# Patient Record
Sex: Female | Born: 1950 | Race: White | Hispanic: No | Smoking: Never smoker
Health system: Southern US, Community
[De-identification: ages and names within clinical notes are randomized; demographics above are authoritative.]

## PROBLEM LIST (undated history)

## (undated) DIAGNOSIS — M1990 Unspecified osteoarthritis, unspecified site: Secondary | ICD-10-CM

## (undated) DIAGNOSIS — F419 Anxiety disorder, unspecified: Secondary | ICD-10-CM

## (undated) DIAGNOSIS — K5909 Other constipation: Secondary | ICD-10-CM

## (undated) DIAGNOSIS — Z9889 Other specified postprocedural states: Secondary | ICD-10-CM

## (undated) DIAGNOSIS — F32A Depression, unspecified: Secondary | ICD-10-CM

## (undated) DIAGNOSIS — K589 Irritable bowel syndrome without diarrhea: Secondary | ICD-10-CM

## (undated) DIAGNOSIS — G4700 Insomnia, unspecified: Secondary | ICD-10-CM

## (undated) DIAGNOSIS — E785 Hyperlipidemia, unspecified: Secondary | ICD-10-CM

## (undated) DIAGNOSIS — R112 Nausea with vomiting, unspecified: Secondary | ICD-10-CM

## (undated) DIAGNOSIS — F329 Major depressive disorder, single episode, unspecified: Secondary | ICD-10-CM

## (undated) DIAGNOSIS — T7840XA Allergy, unspecified, initial encounter: Secondary | ICD-10-CM

## (undated) HISTORY — PX: TONSILLECTOMY: SUR1361

## (undated) HISTORY — PX: COLONOSCOPY: SHX174

## (undated) HISTORY — DX: Allergy, unspecified, initial encounter: T78.40XA

## (undated) HISTORY — DX: Anxiety disorder, unspecified: F41.9

## (undated) HISTORY — DX: Insomnia, unspecified: G47.00

## (undated) HISTORY — PX: KNEE ARTHROSCOPY: SHX127

## (undated) HISTORY — DX: Unspecified osteoarthritis, unspecified site: M19.90

## (undated) HISTORY — DX: Major depressive disorder, single episode, unspecified: F32.9

## (undated) HISTORY — DX: Depression, unspecified: F32.A

## (undated) HISTORY — DX: Hyperlipidemia, unspecified: E78.5

---

## 1994-08-22 HISTORY — PX: FRACTURE SURGERY: SHX138

## 1994-08-22 HISTORY — PX: HARDWARE REMOVAL: SHX979

## 1998-07-20 ENCOUNTER — Other Ambulatory Visit: Admission: RE | Admit: 1998-07-20 | Discharge: 1998-07-20 | Payer: Self-pay | Admitting: *Deleted

## 1999-07-29 ENCOUNTER — Other Ambulatory Visit: Admission: RE | Admit: 1999-07-29 | Discharge: 1999-07-29 | Payer: Self-pay | Admitting: *Deleted

## 2000-03-13 ENCOUNTER — Encounter: Admission: RE | Admit: 2000-03-13 | Discharge: 2000-03-13 | Payer: Self-pay | Admitting: Sports Medicine

## 2000-09-28 ENCOUNTER — Encounter (INDEPENDENT_AMBULATORY_CARE_PROVIDER_SITE_OTHER): Payer: Self-pay | Admitting: Specialist

## 2000-09-28 ENCOUNTER — Ambulatory Visit (HOSPITAL_COMMUNITY): Admission: RE | Admit: 2000-09-28 | Discharge: 2000-09-28 | Payer: Self-pay | Admitting: *Deleted

## 2000-10-25 ENCOUNTER — Other Ambulatory Visit: Admission: RE | Admit: 2000-10-25 | Discharge: 2000-10-25 | Payer: Self-pay | Admitting: *Deleted

## 2001-08-22 HISTORY — PX: KNEE ARTHROSCOPY: SHX127

## 2001-09-07 ENCOUNTER — Other Ambulatory Visit: Admission: RE | Admit: 2001-09-07 | Discharge: 2001-09-07 | Payer: Self-pay | Admitting: *Deleted

## 2001-12-05 ENCOUNTER — Ambulatory Visit (HOSPITAL_COMMUNITY): Admission: RE | Admit: 2001-12-05 | Discharge: 2001-12-05 | Payer: Self-pay | Admitting: Gastroenterology

## 2002-07-01 ENCOUNTER — Ambulatory Visit (HOSPITAL_BASED_OUTPATIENT_CLINIC_OR_DEPARTMENT_OTHER): Admission: RE | Admit: 2002-07-01 | Discharge: 2002-07-01 | Payer: Self-pay | Admitting: Orthopedic Surgery

## 2002-09-10 ENCOUNTER — Other Ambulatory Visit: Admission: RE | Admit: 2002-09-10 | Discharge: 2002-09-10 | Payer: Self-pay | Admitting: Obstetrics & Gynecology

## 2003-09-16 ENCOUNTER — Other Ambulatory Visit: Admission: RE | Admit: 2003-09-16 | Discharge: 2003-09-16 | Payer: Self-pay | Admitting: Obstetrics & Gynecology

## 2004-08-22 HISTORY — PX: DILATION AND CURETTAGE OF UTERUS: SHX78

## 2004-09-17 ENCOUNTER — Ambulatory Visit (HOSPITAL_COMMUNITY): Admission: RE | Admit: 2004-09-17 | Discharge: 2004-09-17 | Payer: Self-pay | Admitting: Obstetrics and Gynecology

## 2004-09-17 ENCOUNTER — Encounter (INDEPENDENT_AMBULATORY_CARE_PROVIDER_SITE_OTHER): Payer: Self-pay | Admitting: Specialist

## 2006-12-25 ENCOUNTER — Ambulatory Visit: Payer: Self-pay | Admitting: Psychology

## 2007-07-04 ENCOUNTER — Ambulatory Visit (HOSPITAL_COMMUNITY): Admission: RE | Admit: 2007-07-04 | Discharge: 2007-07-04 | Payer: Self-pay | Admitting: Obstetrics

## 2007-07-04 ENCOUNTER — Encounter (INDEPENDENT_AMBULATORY_CARE_PROVIDER_SITE_OTHER): Payer: Self-pay | Admitting: Obstetrics

## 2009-08-27 ENCOUNTER — Ambulatory Visit: Payer: Self-pay | Admitting: Sports Medicine

## 2009-08-27 DIAGNOSIS — M545 Low back pain, unspecified: Secondary | ICD-10-CM | POA: Insufficient documentation

## 2009-08-27 DIAGNOSIS — M1712 Unilateral primary osteoarthritis, left knee: Secondary | ICD-10-CM | POA: Insufficient documentation

## 2009-08-27 DIAGNOSIS — M76899 Other specified enthesopathies of unspecified lower limb, excluding foot: Secondary | ICD-10-CM | POA: Insufficient documentation

## 2009-08-27 DIAGNOSIS — M79609 Pain in unspecified limb: Secondary | ICD-10-CM | POA: Insufficient documentation

## 2010-05-25 ENCOUNTER — Encounter: Payer: Self-pay | Admitting: Sports Medicine

## 2010-09-23 NOTE — Consult Note (Signed)
Summary: [facility]  [facility]   Imported By: Marily Memos 05/26/2010 11:31:21  _____________________________________________________________________  External Attachment:    Type:   Image     Comment:   External Document

## 2010-09-23 NOTE — Consult Note (Signed)
Summary: [facility]  [facility]   Imported By: Marily Memos 05/26/2010 13:37:19  _____________________________________________________________________  External Attachment:    Type:   Image     Comment:   External Document

## 2010-09-23 NOTE — Assessment & Plan Note (Signed)
Summary: Erika Johns/POSSIBLE ARTHRITIS/HIP /MJD   Vital Signs:  Patient profile:   60 year old female Height:      70 inches Weight:      155 pounds BMI:     22.32 BP sitting:   112 / 77  Vitals Entered By: Lillia Pauls CMA (August 27, 2009 3:43 PM)  History of Present Illness: Pt presents for a variety of concerns today including bilateral hip pain, numbness in her left 1st through 3rd fingers and right ankle pain. She has recently lost her father and was diagnosed with depression and insomnia. She was started on Remeron which has been helpful for her sleep. However, she can have very severe back pain with just leaning forward. On the other hand, though, she can cycle without any difficulties. She has now had the intermittent back pain for years. It is typically worse in the morning when she gets up. After she does stretches and takes a hot shower, her pain improves. When she does have the pain, she feels it in her lower back and it radiates down to her bilateral hips. She is no longer able to sleep flat on her back but can sleep on her hips. Her hip pain is lateral with minimal radiation down her ITB. She has taken OTC meds including NSAIDs but these are only mildly helpful. She has a history of scoliosis.   Her left 1st through 3rd finger numbness usually takes place only when she gets to sleep longer in the weekends. She denies weakness or constant symptoms. She has no prior history of carpal tunnel syndrome.    The patient also has a history of chronic left knee pain that she has had injected multiple times in the past. She usually gets relief for about 6 months. She is requesting another injection today if possible.   Allergies (verified): 1)  ! Ampicillin  Physical Exam  General:  alert and well-developed.   Head:  normocephalic and atraumatic.   Msk:  Back: Scoliosis noted of her thoracic spine with her left shoulder blade being higher than her right shoulder blade + TTP along L4-L5  but is maily paraspinal muscle pain Excellent forward flexion, mild-mod pain with back extension but limited radiation of pain, no pain with side to side movements or rotation except for tightness in her lower back Able to walk on heels and toes Normal sensation of bilateral lower extremities 5/5 strength with resisted knee flexion and extension Neg SLR bilaterally 5/5 strength with resisted hip flexion and extension + TTP of bilateral trochanteric bursas  Left Hand No atrophy Full ROM of wrist Neg tinnel's testing 5/5 grip strength No TTP throughout  Left Knee: No bony abnormalities, edema or bruising. Full flexion and extension of the knee Minimal TTP along the lateral joint line Normal MCL and LCL Mild pain with flexion pinch and McMurray's testing Negative anterior and posterior drawer signs Able to walk without a limp 5/5 strength with resisted knee flexion and extension  Right Knee: No bony abnormalities, edema or bruising. Full flexion and extension of the knee No TTP along the joint line Normal MCL and LCL No pain with flexion pinch and McMurray's testing Negative anterior and posterior drawer signs Able to walk without a limp 5/5 strength with resisted knee flexion and    Impression & Recommendations:  Problem # 1:  LUMBAGO (ICD-724.2) Assessment New Patient is having acute flares of a chronic disorder. Remeran has been helpful for the patient's sleep but she has  been gaining weight. 1. Will taper the patient off of Remeron since her grief is now better controlled and titrate up amitriptyline 25mg . The paitent is to call immediately if she has any difficulty with urination as she has had problems with urinary retention. 2. Patient is to continue daily exercises and warm showers. 3. Modify activities as needed. 4. Follow-up in 4 weeks to assess new amitriptyline.  Problem # 2:  TROCHANTERIC BURSITIS (ICD-726.5) Assessment: New This pain is bilateral but not  severe. 1. Amitriptyline should help with pain symptoms. 2. Continue home back exercises and make sure that she continues to keep a normal walking gait.  Problem # 3:  HAND PAIN, LEFT (ICD-729.5) Assessment: New May be due to position while sleep since this in only a very itermittent finding., 1. Will monitor and consider a night splint if symptoms progress.  Problem # 4:  KNEE PAIN (ICD-719.46)  The patient had her left knee injected during the office visit for chronic knee pain relief. She tolerated the procedure well without any difficulty.  Consent obtained and verified. Sterile betadine prep. Furthur cleansed with alcohol. Topical analgesic spray: Ethyl chloride. Joint: left knee Approached in typical fashion with: distal medial Completed without difficulty Meds: 40mg  of kenalog and 6 cc of 1% lidocaine Needle: 25 guage Aftercare instructions and Red flags advised.  Orders: Joint Aspirate / Injection, Large (20610) Kenalog 10mg  (4units) (J3301)  Complete Medication List: 1)  Amitriptyline Hcl 25 Mg Tabs (Amitriptyline hcl) .... One tab by mouth qhs  Patient Instructions: 1)  Take half a tablet of the Remeron at night for the next week, then take it every other day for the following week. 2)  Start the amitriptyline at bedtime the week that you've decreased the Remeron every other day. 3)  If you have urinary symptoms please let us know and stop taking the amitriptyline.  Prescriptions: AMITRIPTYLINE HCL 25 MG TABS (AMITRIPTYLINE HCL) One tab by mouth QHS  #30 x 1   Entered and Authorized by:   Jannifer Rodney MD   Signed by:   Jannifer Rodney MD on 08/27/2009   Method used:   Electronically to        [facility]  [address] [phone]* (retail)       [address]       [place], [place]  [address]       Ph: [phone] or [phone]       Fax: [phone]   RxID:   [id number]

## 2011-01-04 NOTE — Op Note (Signed)
NAMEMARTESHA, Erika Johns     ACCOUNT NO.:  [id number]   MEDICAL RECORD NO.:  [id number]          PATIENT TYPE:  AMB   LOCATION:  [facility]C                           FACILITY:  [facility]   PHYSICIAN:  Lendon Colonel, MD   DATE OF BIRTH:  04-13-1951   DATE OF PROCEDURE:  07/04/2007  DATE OF DISCHARGE:                               OPERATIVE REPORT   PREOPERATIVE DIAGNOSIS:  Pelvic mass, free pelvic fluid.   POSTOPERATIVE DIAGNOSIS:  Free pelvic fluid, pedunculated fibroids.   OPERATION PERFORMED:  Diagnostic laparoscopy, aspiration of free pelvic  fluid.   SURGEON:  Lendon Colonel, MD   ASSISTANT:  Eliberto Ivory. Rosalio Macadamia, M.D.   ANESTHESIA:  General.   FINDINGS:  Small uterus, stenosed cervix, small atrophic ovaries  bilaterally.  Moderate amount of yellow free pelvic fluid.  Three  pedunculated fibroids, largest at the left cornua measuring  approximately 3 x 4 cm with a thick stalk.  The other two pedunculated  fibroids are approximately 1 cm with thick stalks as well.   SPECIMENS:  Free pelvic fluid to pathology.   ESTIMATED BLOOD LOSS:  10 mL.   COMPLICATIONS:  None.   DESCRIPTION OF PROCEDURE:  After informed consent was obtained, the  patient was taken to the operating room where general anesthesia was  initiated successfully.  She was prepped and draped in the normal  sterile fashion in dorsal supine lithotomy position.  A Foley catheter  was inserted sterilely.  A Bimanual examination was done to assess the  size and position of the uterus.  No adnexal masses were palpated.  A  hand was inserted into the vagina.  Single toothed tenaculum was used to  grasp the anterior lip of the cervix.  The cervix was unable to be  dilated secondary to cervical stenosis.  A second tenaculum was placed  on the posterior lip of the cervix and this was used for uterine  manipualtion during the case.  Gloves were changed and attention was  taken to the patient's abdomen.  10 mL of 0.25%  Marcaine were infused in  the infraumbilical fold.  A scalpel was used to make a skin incision.  A  Veress needle was inserted without difficulty.  Intraperitoneal  placement was confirmed with the use of a gas-filled syringe.  A  pneumoperitoneum was created to 15 mmHg.  A nonbladed 10 mm trocar was  inserted into the patient's abdomen.  Intraperitoneal placement was  confirmed with the laparoscope and pneumoperitoneum was maintained.  Patient was placed in steep Trendelenburg position and the survey of the  patient's abdomen and pelvis revealed findings as above. Normal liver  edge was noted, moderate amount of free pelvic fluid, normal atrophic  bilaterally ovaries were noted and three normal-appearing pedunculated  fibroids were noted.  To obtain better visualization, a 5 mm left lower  quadrant port was inserted after first infusing with 0.25% Marcaine in  the skin incision.  A nonbladed trocar was used through this port a  blunt probe and later a blunt grasping instrument was used for better  exposure of the patient's abdomen and pelvis.  Given the apparent normal  findings and the patient's desire for diagnostic laparoscopy and  operative laparoscopy only if necessary, decision was made to terminate  the procedure.  The pelvic fluid was aspirated through a needle  aspirator inserted through the lateral port.  The pneumoperitoneum was  released.  All instruments were removed under visualization and the skin  incisions were closed with 4-0 Monocryl and Dermabond.  Foley catheter  was removed.  Tenaculums were removed.  Repeat speculum exam revealed  hemostasis at the tenaculum sites.  The patient tolerated the procedure  and was taken to recovery room in stable condition after sponge, lap,  and needle counts were correct x2.      Lendon Colonel, MD  Electronically Signed     KAF/MEDQ  D:  07/04/2007  T:  07/04/2007  Job:  [id number]

## 2011-01-07 NOTE — H&P (Signed)
[facility]  Patient:    Erika Johns, Erika Johns              MRN: [id number] Adm. Date:  09/28/00 Attending:  Sung Amabile. Roslyn Smiling, M.D.                         History and Physical  DATE OF BIRTH:                1951-02-10  CHIEF COMPLAINT:              Endometrial mass and postmenopausal bleeding.  HISTORY OF PRESENT ILLNESS:   A 60 year old woman G0 receiving Premarin 0.625 mg and Prometrium 200 mg on days 1-14 of every month who is admitted for operative hysteroscopy after ultrasound revealed a thickened endometrium measuring 1.15 cm and a hyperechoic mass measuring 0.74 x 0.7 cm.  This study was performed because of bleeding at a time that was not appropriate for withdrawal.  The patient has been counseled regarding the benefits, risks, options, and expected outcome of this procedure prior to the hysteroscopy. She is admitted now to rule out endometrial pathology.  PAST MEDICAL HISTORY:         Urinary tract infections.  PAST SURGICAL HISTORY:        Reduction of right fibula fracture in 1996.  ALLERGIES:                    AMPICILLIN and SUDAFED.  MEDICATIONS:                  Premarin, Prometrium, Nasonex, Zyrtec, calcium, vitamins.  FAMILY HISTORY:               Mother with history of breast and colon cancer, sister with adult-onset diabetes mellitus.  SOCIAL HISTORY:               Married.  Works at [employer].  Denies tobacco use.  Drinks one glass of wine weekly.  PHYSICAL EXAMINATION:  GENERAL:                      Healthy appearing woman.  VITAL SIGNS:                  Afebrile.  Vital signs stable.  HEENT:                        Within normal limits.  NECK:                         Without thyromegaly.  CHEST:                        Clear.  COR:                          Regular rate and rhythm.  S1, S2 normal.  BREASTS:                      Without mass, tenderness, axillary or supraclavicular  nodes.  ABDOMEN:                      Soft, nontender without organomegaly, mass, or hernia.  BACK:  Without CVAT.  GENITOURINARY:                External genitalia, BUS, vagina without lesions. Cervix without lesions.  Uterus:  Retroverted, normal size, nontender, mobile. Adnexa:  Not palpable.  Rectovaginal examination confirmatory.  EXTREMITIES:                  Without CCE.  SKIN:                         Without lesions.  NEUROLOGIC:                   Grossly intact.  ASSESSMENT:                   1. Endometrial mass.                               2. Postmenopausal bleeding at a time that is not                                  expected in patient receiving cyclical                                  hormonal replacement therapy.  PLAN:                         [facility] DD:  09/27/00 TD:  09/27/00 Job: [id number]

## 2011-01-07 NOTE — Op Note (Signed)
NAME:  Erika Johns, Erika Johns               ACCOUNT NO.:  [id number]   MEDICAL RECORD NO.:  [id number]                   PATIENT TYPE:  AMB   LOCATION:  DSC                                  FACILITY:  [facility]   PHYSICIAN:  Robert A. Thurston Hole, M.D.              DATE OF BIRTH:  August 21, 1951   DATE OF PROCEDURE:  07/01/2002  DATE OF DISCHARGE:                                 OPERATIVE REPORT   PREOPERATIVE DIAGNOSIS:  Left knee patellofemoral chondromalacia and  synovitis.   POSTOPERATIVE DIAGNOSIS:  Left knee patellofemoral chondromalacia and  synovitis with loose bodies.   PROCEDURES:  1. Left knee examination under anesthesia followed by arthroscopic excision     of loose bodies.  2. Left knee chondroplasty.  3. Left knee lateral retinacular release.   SURGEON:  Elana Alm. Thurston Hole, M.D.   ASSISTANT:  Julien Girt, P.A.   ANESTHESIA:  General.   OPERATIVE TIME:  30 minutes.   COMPLICATIONS:  None.   INDICATIONS FOR PROCEDURE:  The patient is a 60 year old woman who has had  six months of increasing left knee with signs and symptoms and MRI  documenting significant patellofemoral chondromalacia and synovitis, who has  failed conservative care and is now to undergo arthroscopy with lateral  release.   DESCRIPTION OF PROCEDURE:  The patient was brought to the operating room on  July 01, 2002, and placed on the operating table in the supine position.  After and adequate level of general anesthesia was obtained, her left knee  was examined under anesthesia.  She had full range of motion and her knee  was stable ligamentous exam with normal patellar tracking.  The left knee  was sterilely injected with 0.25% Marcaine with epinephrine.  The left leg  was then prepped using sterile DuraPrep and draped using sterile technique.  Originally through an inferolateral portal the arthroscope with a pump  attachment was placed and through an inferomedial portal an arthroscope  was  placed.  On initial inspection of the medial compartment, she was found to  have grade 3 chondromalacia over 30% of the medial femoral condyle, which  was debrided.  There were loose articular cartilage pieces in the medial  compartment and these were removed.  The medial tibial plateau showed mild  grade 1-2 chondromalacia.  The medial meniscus was normal.  ACL and PCL were  normal.  Lateral compartment articular cartilage normal.  Lateral meniscus  normal.  The patellofemoral joint showed grade 3 chondromalacia over 75% of  the patella, which was thoroughly debrided, and 20% of the femoral groove,  which was debrided.  The patella tracked normally, but there was significant  patella compression syndrome and significant synovitis in the medial and  lateral gutters, which was debrided.  A lateral retinacular release was  carried out with an ArthroCare wand.  No excessive bleeding was encountered  and this significantly decompressed the patellofemoral joint and patellar  tracking was normal after this was done.  At this point, it was felt that  all pathology had been satisfactorily addressed.  The instruments were  removed.  The portals were closed with 3-0 nylon suture and injected with  0.25% Marcaine with epinephrine and 4 mg of morphine.  A sterile dressing  was applied.  The patient was awaken and taken to the recovery room in  stable condition.   FOLLOW-UP CARE:  The patient will be followed as an outpatient on Darvocet  and Celebrex.  Will see her back in the office in a week for sutures out and  follow-up.                                               Robert A. Thurston Hole, M.D.    RAW/MEDQ  D:  07/01/2002  T:  07/01/2002  Job:  [id number]

## 2011-01-07 NOTE — Procedures (Signed)
[facility]  Patient:    JISELE, PRICE Visit Number: [id number] MRN: [id number]          Service Type: END Location: ENDO Attending Physician:  Dennison Bulla Ii Dictated by:   Verlin Grills, M.D. Proc. Date: 12/05/01 Admit Date:  12/05/2001   CC:         Sung Amabile. Roslyn Smiling, M.D.   Procedure Report  PROCEDURE:  Screening colonoscopy.  REFERRING PHYSICIAN:  Sung Amabile. Roslyn Smiling, M.D.  PROCEDURE INDICATION:  Ms. Hadas Jessop is a 60 year old female born 01/25/1951.  Her mother was diagnosed with colon cancer at age 65. Ms. Haren screening colonoscopy, March 1998, was normal.  ENDOSCOPIST:  Verlin Grills, M.D.  PREMEDICATION:  Versed 10 mg, Demerol 50 mg.  ENDOSCOPE:  Olympus pediatric colonoscope.  DESCRIPTION OF PROCEDURE:  After obtaining informed consent, Ms. Sparks-Baumgartner was placed in the left lateral decubitus position.  I administered intravenous Demerol and intravenous Versed to achieve conscious sedation for the procedure.  The patients blood pressure, oxygen saturation, and cardiac rhythm were monitored throughout the procedure and documented in the medical record.  Anal inspection was normal.  Digital rectal exam was normal.  The Olympus pediatric video colonoscope was introduced into the rectum and easily advanced to the cecum.  Colonic preparation for the exam today was excellent.  RECTUM:  Normal.  SIGMOID COLON AND DESCENDING COLON:  Normal.  SPLENIC FLEXURE:  Normal.  TRANSVERSE COLON:  Normal.  HEPATIC FLEXURE:  Normal.  ASCENDING COLON:  Normal.  CECUM AND ILEOCECAL VALVE:  Normal.  ASSESSMENT:  Normal screening proctocolonoscopy to the cecum.  No endoscopic evidence for the presence of colorectal neoplasia.  RECOMMENDATIONS:  Repeat colonoscopy in approximately five years. Dictated by:   Verlin Grills, M.D. Attending Physician:  Dennison Bulla Ii DD:  12/05/01 TD:  12/05/01 Job: [id number]

## 2011-01-07 NOTE — H&P (Signed)
NAME:  Erika, Johns NO.:  [id number]   MEDICAL RECORD NO.:  [id number]          PATIENT TYPE:  AMB   LOCATION:  SDC                           FACILITY:  [facility]   PHYSICIAN:  Richardean Sale, M.D.   DATE OF BIRTH:  Mar 25, 1951   DATE OF ADMISSION:  09/17/2004  DATE OF DISCHARGE:                                HISTORY & PHYSICAL   PREOPERATIVE DIAGNOSIS:  Abnormal uterine bleeding and endometrial mass on  sonohistogram.   HISTORY OF PRESENT ILLNESS:  This is a 60 year old white female, gravida 0,  who has been on combined hormone replacement therapy for approximately four  years.  The patient has been on Menest 0.625 mg q.d. with Prometrium 200 mg  p.o. q.d. on days 1 through 12 of each month.  She has been having regular  predictable withdrawal bleeds, but over the past four to five months, she  has had breakthrough bleeding.  The patient underwent ultrasound evaluation  which revealed a normal-sized uterus, normal adnexa, and a thickened  endometrial stripe at 1.1 cm with a questionable hyperechoic mass at the  fundus.  The patient subsequently underwent sonohistogram which confirmed  the presence of an endometrial hyperechoic mass measuring 2.4 by 1.51 cm,  which is a suggestion of a submucosal fibroid.   The patient has a history of submucosal fibroid removed in February of 2002  by hysteroscopy D&C.  The patient denies any abdominal pain.  No changes in  her bowel or bladder habits.  No nausea or vomiting.  No constitutional  symptoms.  She presents today for hysteroscopy and proceed with resection of  endometrial mass.   PAST MEDICAL HISTORY:  Urinary tract infections.   PAST SURGICAL HISTORY:  Hysterectomy and D&C in 2002.  Reduction of right  fibula fracture in 1996.   GYNECOLOGIC HISTORY:  No abnormal Pap smears or STD's.   OBSTETRICAL HISTORY:  Gravida 0.   FAMILY HISTORY:  Mother with breast and colon cancer.  Sister with adult  onset diabetes.   SOCIAL HISTORY:  She is married.  Denies tobacco, alcohol or drug use.   MEDICATIONS:  1.  Menest 0.625 mg p.o. q.d.  2.  Prometrium 200 mg p.o. q.d. on days 1 through 12 of each month.  3.  Cardura 1 p.o. q.d.  4.  Premarin and vaginal cream p.r.n.  5.  Ambien 5 to 10 mg p.o. q.h.s. p.r.n. insomnia.  6.  Macrobid 100 mg p.o. q.d. after intercourse.   ALLERGIES:  AMPICILLIN and SUDAFED.   PHYSICAL EXAMINATION:  VITAL SIGNS:  She is afebrile.  Vital signs are  stable.  GENERAL:  She is a well-developed, well-nourished white female who is in no  apparent distress.  NECK:  Neck and thyroid within normal limits with no masses.  HEART:  Regular rate and rhythm.  LUNGS:  Clear to auscultation bilaterally.  ABDOMEN:  Soft, nontender, and nondistended with no palpable masses.  Liver  and spleen are normal.  EXTREMITIES:  No clubbing, cyanosis, or edema.  GENITALIA:  External genitalia within normal limits.  Vagina without  lesions.  Mild vaginal atrophy is noted.  Uterus is retroflexed, normal  sized, nontender, mobile.  Adnexa is not palpable.  Rectovaginal exam  confirms the above.   ASSESSMENT:  A 60 year old, gravida 0, white female with endometrial mass on  sonohistogram suggestive of submucosal fibroid or possible endometrial  polyp.   PLAN:  We will proceed with hysteroscopy, D&C with resection of endometrial  polyp and/or fibroid.  The risks, benefits, and alternatives of the  procedure have been reviewed with the patient in detail.  We discussed the  risks of hemorrhage requiring transfusion, infection, injury to the uterus  which could cause injury to the bowel or bladder or other intra-abdominal  organs which could require additional surgery for repair, possibility of  DVT, anesthetic related complications, and even death.  The patient voices  understanding of these risks and agrees to proceed.  All questions have been  answered and informed consent obtained.       JW/MEDQ  D:  09/16/2004  T:  09/16/2004  Job:  [id number]

## 2011-01-07 NOTE — Op Note (Signed)
[name], SWING     ACCOUNT NO.:  [id number]   MEDICAL RECORD NO.:  [id number]          PATIENT TYPE:  AMB   LOCATION:  SDC                           FACILITY:  [facility]   PHYSICIAN:  Richardean Sale, M.D.   DATE OF BIRTH:  [birth year]   DATE OF PROCEDURE:  09/17/2004  DATE OF DISCHARGE:                                 OPERATIVE REPORT   PREOPERATIVE DIAGNOSIS:  Endometrial mass.   POSTOPERATIVE DIAGNOSIS:  Endometrial polyp.   SPECIMENS:  Endometrial polyps, endometrial curettings sent to pathology.   SURGEON:  Richardean Sale, M.D.   ANESTHESIA:  General and paracervical block.   COMPLICATIONS:  None.   ESTIMATED BLOOD LOSS:  Minimal.   FLUID DEFICIT:  30 mL.   INDICATIONS:  This is a 60 year old gravida 0 white female who has a four to  five-month history of intermenstrual bleeding and worsening menses and  dysmenorrhea.  The patient underwent ultrasound evaluation, which showed a  thickened endometrial stripe.  Sonohysterogram was performed, which revealed  an endometrial mass originating from the fundus.  The patient presents today  for hysteroscopy D&C and removal of endometrial mass.  Prior to the  procedure, the risks, benefits, and alternatives were reviewed with the  patient in detail and informed consent obtained.  We discussed the risks of  hemorrhage requiring transfusion, infection, possible injury to the uterine  cavity which could require additional surgery to the abdomen to evaluate for  any injury to the bowel or bladder, the possibility of anesthetic-related  complications, DVT, in depth.  The patient voiced an understanding of all  these risks and agreed to proceed, informed consent obtained and all  questions answered before proceeding into the OR.   PROCEDURE:  The patient was taken to the operating room, where she was given  a general anesthetic.  She was then prepped and draped in the usual sterile  fashion in the dorsal lithotomy position  with a Betadine prep.  A red rubber  catheter was then placed to drain the bladder.  Once the bladder was  drained, bimanual exam was performed which confirmed the presence of a small  retroverted uterus which is mobile in the midline with no obvious masses.  Adnexa were not palpable.  A speculum was placed in the vagina and the  cervix was easily visualized and 2 mL of 1% Nesacaine were injected at the  anterior lip of the cervix.  The anterior lip of the cervix was then grasped  with a single-tooth tenaculum and a paracervical block was then administered  using a total of 20 mL of 1% Nesacaine.  The cervix was then very gently  dilated with the Hegar dilators until the hysteroscope could be introduced.  Upon initial entry of the hysteroscope, there appeared to be a mass  originating from the fundus that was visible at the internal cervical os.  Given its presence, I was unable to push the scope any further into the  endometrial cavity safely; therefore, the hysteroscope was removed and the  polyp forceps were then used to grasp the polyp.  This was removed quite  easily.  The polyp was then sent  to pathology labeled as endometrial  polyp.  The hysteroscope was then reintroduced and the uterine cavity was  now able to be more closely inspected.  There were multiple polyps coming  from the fundus; therefore, the hysteroscope was removed and the polyp  forceps were used to remove these polyps.  These were all sent to pathology  labeled as endometrial polyps.  Once the polyps had been removed, this was  followed by a sharp curettage until a gritty texture was noted in all four  quadrants.  The curettings were then sent to pathology.  Once the curettage  was complete, the hysteroscope was reintroduced and uterine cavity was  visualized and appeared normal.  There were no further polyps present.  The  endocervical canal appeared normal as well.  At this point the procedure was  terminated, the  hysteroscope was removed, the single-tooth tenaculum was  removed, and there was minimal bleeding coming from the cervical os.  All  instruments were then removed.  The patient was taken out of the dorsal  lithotomy position and was awakened from anesthesia and transferred to the  recovery room in good condition.  There were no complications.  All sponge,  lap, needle and instrument counts were correct x2.      JW/MEDQ  D:  09/17/2004  T:  09/17/2004  Job:  [id number]

## 2011-01-07 NOTE — Op Note (Signed)
[facility]  Patient:    Erika Johns, Erika Johns            MRN: [id number] Proc. Date: 09/28/00 Adm. Date:  [id number] Attending:  Ardeen Fillers CC:         Sung Amabile. Roslyn Smiling, M.D.   Operative Report  PREOPERATIVE DIAGNOSES:       1. Postmenopausal bleeding.                               2. Endometrial mass noted on ultrasound.  POSTOPERATIVE DIAGNOSES:      1. Postmenopausal bleeding.                               2. Endometrial mass noted on ultrasound.  OPERATION/PROCEDURE:          1. Operative hysteroscopy.                               2. Dilatation and curettage.                               3. Repair of cervical laceration.  SURGEON:                      Sung Amabile. Roslyn Smiling, M.D.  ANESTHESIA:                   General anesthesia via LMA, paracervical block.  ESTIMATED BLOOD LOSS:         Estimated blood loss was 50 cc.  TUBES/DRAINS:                 None.  COMPLICATIONS:                Superficial anterior cervical laceration, repaired.  FINDINGS:                     The uterus was retroflexed and sounded to 10 cm. Cervical stenosis was noted.  A mass was noted in the endometrial cavity and was resected.  SPECIMENS:                    Endometrial biopsies and endometrial curettings sent to pathology.  INDICATIONS FOR PROCEDURE:    The patient is a 60 year old postmenopausal woman receiving cyclical hormone replacement therapy, who has had bleeding at unexpected intervals.  Ultrasound reveals a thickened endometrium and suggestion of an endometrial mass, and the patient is now admitted for operative hysteroscopy and resection of this mass.  DESCRIPTION OF PROCEDURE:     After establishment of general anesthesia the patient was placed on the operating room table in the dorsal lithotomy position and the perineum and vagina were prepped with Betadine solution, the bladder was evacuated, and the patient draped.              Examination under anesthesia was carried out.  A Graves speculum inserted in the vagina.  The cervix was reprepped with Betadine solution and the anterior and posterior cervical lip infiltrated with xylocaine 1%.  A paracervical block was placed in the usual fashion using 20 cc of xylocaine 1%.  The anterior cervix was grasped with a single-tooth tenaculum and the uterus was retroflexed and sounded to 10 cm.  Pratt dilators were used to dilate the cervix.  In the process of dilation the tenaculum pulled through the anterior cervical lip and this was repaired at that time with two figure-of-eight sutures of 0 chromic.  Single-tooth tenaculums were placed on the anterior and posterior lip of the cervix subsequently.                                The cervix was dilated to a #35 [place].  The hysteroscope was passed into the endometrial cavity and photographs were taken.  A mass on the posterior wall was noted and this was resected using a 90 degree double-loop hysteroscopic resectoscopic loop with settings of 190 and 90 cutting and coagulation respectively.  Sorbitol was the distending medium.  Pressures ranged between 60-80 mmHg during the case.  The scope was removed and sharp curettage performed.  The resectoscopic biopsies were sent separately from the endometrial curettings.  Instruments were removed and hemostasis was noted.  The Sorbitol deficit at the end of the case was considered to be 200 cc.  The patient was returned to the supine position, extubated without difficulty, and transported to the recovery room in satisfactory condition. DD:  09/28/00 TD:  09/29/00 Job: [id number]

## 2011-02-01 ENCOUNTER — Other Ambulatory Visit: Payer: Self-pay | Admitting: Obstetrics

## 2011-05-31 LAB — CBC
HCT: 38.2
MCV: 92.5
Platelets: 144 — ABNORMAL LOW
WBC: 4.1

## 2011-07-27 ENCOUNTER — Ambulatory Visit (INDEPENDENT_AMBULATORY_CARE_PROVIDER_SITE_OTHER): Payer: BC Managed Care – PPO

## 2011-07-27 DIAGNOSIS — J0100 Acute maxillary sinusitis, unspecified: Secondary | ICD-10-CM

## 2011-09-23 ENCOUNTER — Other Ambulatory Visit: Payer: Self-pay | Admitting: Family Medicine

## 2011-10-04 ENCOUNTER — Ambulatory Visit (INDEPENDENT_AMBULATORY_CARE_PROVIDER_SITE_OTHER): Payer: BC Managed Care – PPO | Admitting: Family Medicine

## 2011-10-04 VITALS — BP 117/69 | HR 62 | Temp 98.8°F | Resp 16 | Ht 70.5 in | Wt 150.4 lb

## 2011-10-04 DIAGNOSIS — J019 Acute sinusitis, unspecified: Secondary | ICD-10-CM

## 2011-10-04 DIAGNOSIS — J329 Chronic sinusitis, unspecified: Secondary | ICD-10-CM

## 2011-10-04 MED ORDER — LEVOFLOXACIN 500 MG PO TABS: 500.0000 mg | ORAL_TABLET | Freq: Every day | ORAL | Status: AC

## 2011-10-04 NOTE — Patient Instructions (Signed)
Sinus Headache A sinus headache is when your sinuses become clogged or swollen. Sinus headaches can range from mild to severe.  CAUSES A sinus headache can have different causes, such as:  Colds.   Sinus infections.   Allergies.  SYMPTOMS  Symptoms of a sinus headache may vary and can include:  Headache.   Pain or pressure in the face.   Congested or runny nose.   Fever.   Inability to smell.   Pain in upper teeth.  Weather changes can make symptoms worse. TREATMENT  The treatment of a sinus headache depends on the cause.  Sinus pain caused by a sinus infection may be treated with antibiotic medicine.   Sinus pain caused by allergies may be helped by allergy medicines (antihistamines) and medicated nasal sprays.   Sinus pain caused by congestion may be helped by flushing the nose and sinuses with saline solution.  HOME CARE INSTRUCTIONS   If antibiotics are prescribed, take them as directed. Finish them even if you start to feel better.   Only take over-the-counter or prescription medicines for pain, discomfort, or fever as directed by your caregiver.   If you have congestion, use a nasal spray to help reduce pressure.  SEEK IMMEDIATE MEDICAL CARE IF:  You have a fever.   You have headaches more than once a week.   You have sensitivity to light or sound.   You have repeated nausea and vomiting.   You have vision problems.   You have sudden, severe pain in your face or head.   You have a seizure.   You are confused.   Your sinus headaches do not get better after treatment. Many people think they have a sinus headache when they actually have migraines or tension headaches.  MAKE SURE YOU:   Understand these instructions.   Will watch your condition.   Will get help right away if you are not doing well or get worse.  Document Released: 09/15/2004 Document Revised: 04/20/2011 Document Reviewed: 11/06/2010 ExitCare Patient Information 2012 [employer],  LLC. 

## 2011-10-04 NOTE — Progress Notes (Signed)
  Subjective:    Patient ID: Erika Johns, female    DOB: 1951-03-06, 61 y.o.   MRN: [id number]  Sore Throat  This is a new problem. The current episode started more than 1 month ago. The problem has been waxing and waning. Associated symptoms include congestion and a hoarse voice.   Recent single episode of epistaxis   Review of Systems  Constitutional: Negative.   HENT: Positive for congestion, hoarse voice and postnasal drip.   Eyes: Negative.   Cardiovascular: Negative.   Gastrointestinal: Negative.   Musculoskeletal: Negative.   Psychiatric/Behavioral: Negative.        Objective:   Physical Exam  Constitutional: She appears well-developed and well-nourished.  HENT:  Head: Normocephalic and atraumatic.  Right Ear: External ear normal.  Left Ear: External ear normal.  Mouth/Throat: No oropharyngeal exudate (red posterior pharynx).  Eyes: Conjunctivae are normal. Pupils are equal, round, and reactive to light.  Neck: Normal range of motion. Neck supple.  Skin: Skin is warm and dry.  Psychiatric: She has a normal mood and affect. Judgment normal.          Assessment & Plan:  Persistent sinus symptoms with mild sorethroat for a week  Plan:  Levaquin 500 qd x 7 days, stop Flonase

## 2011-11-23 ENCOUNTER — Ambulatory Visit (INDEPENDENT_AMBULATORY_CARE_PROVIDER_SITE_OTHER): Payer: BC Managed Care – PPO | Admitting: Family Medicine

## 2011-11-23 VITALS — BP 115/71 | HR 101 | Temp 99.7°F | Resp 18 | Ht 70.5 in | Wt 150.6 lb

## 2011-11-23 DIAGNOSIS — R110 Nausea: Secondary | ICD-10-CM

## 2011-11-23 LAB — POCT CBC
Granulocyte percent: 84.1 %G — AB (ref 37–80)
HCT, POC: 40.4 % (ref 37.7–47.9)
Hemoglobin: 13.2 g/dL (ref 12.2–16.2)
Lymph, poc: 0.6 (ref 0.6–3.4)
MCH, POC: 29.9 pg (ref 27–31.2)
MCHC: 32.7 g/dL (ref 31.8–35.4)
MCV: 91.4 fL (ref 80–97)
MID (cbc): 0.2 (ref 0–0.9)
MPV: 8.8 fL (ref 0–99.8)
POC Granulocyte: 4.3 (ref 2–6.9)
POC LYMPH PERCENT: 11.3 %L (ref 10–50)
POC MID %: 4.6 %M (ref 0–12)
Platelet Count, POC: 138 10*3/uL — AB (ref 142–424)
RBC: 4.42 M/uL (ref 4.04–5.48)
RDW, POC: 12.2 %
WBC: 5.1 10*3/uL (ref 4.6–10.2)

## 2011-11-23 MED ORDER — ONDANSETRON 8 MG PO TBDP: 8.0000 mg | ORAL_TABLET | Freq: Three times a day (TID) | ORAL | Status: AC | PRN

## 2011-11-23 NOTE — Patient Instructions (Signed)
Viral Syndrome You or your child has Viral Syndrome. It is the most common infection causing "colds" and infections in the nose, throat, sinuses, and breathing tubes. Sometimes the infection causes nausea, vomiting, or diarrhea. The germ that causes the infection is a virus. No antibiotic or other medicine will kill it. There are medicines that you can take to make you or your child more comfortable.  HOME CARE INSTRUCTIONS   Rest in bed until you start to feel better.   If you have diarrhea or vomiting, eat small amounts of crackers and toast. Soup is helpful.   Do not give aspirin or medicine that contains aspirin to children.   Only take over-the-counter or prescription medicines for pain, discomfort, or fever as directed by your caregiver.  SEEK IMMEDIATE MEDICAL CARE IF:   You or your child has not improved within one week.   You or your child has pain that is not at least partially relieved by over-the-counter medicine.   Thick, colored mucus or blood is coughed up.   Discharge from the nose becomes thick yellow or green.   Diarrhea or vomiting gets worse.   There is any major change in your or your child's condition.   You or your child develops a skin rash, stiff neck, severe headache, or are unable to hold down food or fluid.   You or your child has an oral temperature above 102 F (38.9 C), not controlled by medicine.   Your baby is older than 3 months with a rectal temperature of 102 F (38.9 C) or higher.   Your baby is 3 months old or younger with a rectal temperature of 100.4 F (38 C) or higher.  Document Released: 07/24/2006 Document Revised: 07/28/2011 Document Reviewed: 07/25/2007 ExitCare Patient Information 2012 [employer]. 

## 2011-11-23 NOTE — Progress Notes (Signed)
61 yo woman whose husband has been ill for 5 days with URI symptoms and abrupt onset fever, nausea, and malaise.    O:  Alert HEENT:  Unremarkable Neck: no adenopathy Chest:  Clear Heart:  Reg w/o murmur Abd: active BS, no HSM Results for orders placed in visit on 11/23/11  POCT CBC      Component Value Range   WBC 5.1  4.6 - 10.2 (K/uL)   Lymph, poc 0.6  0.6 - 3.4    POC LYMPH PERCENT 11.3  10 - 50 (%L)   MID (cbc) 0.2  0 - 0.9    POC MID % 4.6  0 - 12 (%M)   POC Granulocyte 4.3  2 - 6.9    Granulocyte percent 84.1 (*) 37 - 80 (%G)   RBC 4.42  4.04 - 5.48 (M/uL)   Hemoglobin 13.2  12.2 - 16.2 (g/dL)   HCT, POC 46.9  62.9 - 47.9 (%)   MCV 91.4  80 - 97 (fL)   MCH, POC 29.9  27 - 31.2 (pg)   MCHC 32.7  31.8 - 35.4 (g/dL)   RDW, POC 52.8     Platelet Count, POC 138 (*) 142 - 424 (K/uL)   MPV 8.8  0 - 99.8 (fL)    A:  Viral syndrome  P:  zofran

## 2011-12-21 ENCOUNTER — Ambulatory Visit
Admission: RE | Admit: 2011-12-21 | Discharge: 2011-12-21 | Disposition: A | Discharge status: Home / Self Care | Payer: BC Managed Care – PPO | Source: Ambulatory Visit | Attending: Gastroenterology | Admitting: Gastroenterology

## 2011-12-21 ENCOUNTER — Other Ambulatory Visit: Payer: Self-pay | Admitting: Gastroenterology

## 2011-12-21 DIAGNOSIS — K631 Perforation of intestine (nontraumatic): Secondary | ICD-10-CM

## 2011-12-22 ENCOUNTER — Telehealth: Payer: Self-pay

## 2011-12-22 NOTE — Telephone Encounter (Signed)
Has appt with Daub Tues 5/7 to discuss cholesterol and new meds possibly. Getting labs drawn early that morning, but would like to have labs drawn sooner (Thurs?) and have test results ready for when she comes in for visit. If Daub will put in lab orders and let someone know to call patient (I will call her if I'm here), she will come in for a draw. Thanks.

## 2011-12-23 ENCOUNTER — Other Ambulatory Visit: Payer: Self-pay | Admitting: Emergency Medicine

## 2011-12-23 DIAGNOSIS — Z0000 Encounter for general adult medical examination without abnormal findings: Secondary | ICD-10-CM

## 2011-12-23 NOTE — Telephone Encounter (Signed)
Please call Ann. I have placed orders in her chart for bloodwork.

## 2011-12-24 NOTE — Telephone Encounter (Signed)
LMOM THAT SHE CAN COME IN TO GET HER BLOOD DRAWN

## 2011-12-27 ENCOUNTER — Ambulatory Visit (INDEPENDENT_AMBULATORY_CARE_PROVIDER_SITE_OTHER): Payer: BC Managed Care – PPO | Admitting: Emergency Medicine

## 2011-12-27 ENCOUNTER — Encounter: Payer: Self-pay | Admitting: Emergency Medicine

## 2011-12-27 VITALS — BP 118/57 | HR 61 | Temp 98.1°F | Resp 16 | Ht 70.0 in | Wt 150.0 lb

## 2011-12-27 DIAGNOSIS — F419 Anxiety disorder, unspecified: Secondary | ICD-10-CM

## 2011-12-27 DIAGNOSIS — E785 Hyperlipidemia, unspecified: Secondary | ICD-10-CM

## 2011-12-27 DIAGNOSIS — IMO0001 Reserved for inherently not codable concepts without codable children: Secondary | ICD-10-CM

## 2011-12-27 DIAGNOSIS — Z0000 Encounter for general adult medical examination without abnormal findings: Secondary | ICD-10-CM

## 2011-12-27 DIAGNOSIS — F411 Generalized anxiety disorder: Secondary | ICD-10-CM

## 2011-12-27 DIAGNOSIS — E7800 Pure hypercholesterolemia, unspecified: Secondary | ICD-10-CM

## 2011-12-27 LAB — COMPREHENSIVE METABOLIC PANEL
ALT: 12 U/L (ref 0–35)
Albumin: 4.1 g/dL (ref 3.5–5.2)
BUN: 17 mg/dL (ref 6–23)
Calcium: 9 mg/dL (ref 8.4–10.5)
Potassium: 4.2 mEq/L (ref 3.5–5.3)
Total Bilirubin: 0.5 mg/dL (ref 0.3–1.2)

## 2011-12-27 LAB — CBC WITH DIFFERENTIAL/PLATELET
Basophils Absolute: 0 10*3/uL (ref 0.0–0.1)
HCT: 40.4 % (ref 36.0–46.0)
Lymphocytes Relative: 39 % (ref 12–46)
Lymphs Abs: 1.6 10*3/uL (ref 0.7–4.0)
MCV: 89 fL (ref 78.0–100.0)
Monocytes Absolute: 0.4 10*3/uL (ref 0.1–1.0)
Platelets: 162 10*3/uL (ref 150–400)
RDW: 12.2 % (ref 11.5–15.5)
WBC: 4.2 10*3/uL (ref 4.0–10.5)

## 2011-12-27 LAB — LIPID PANEL
LDL Cholesterol: 94 mg/dL (ref 0–99)
VLDL: 13 mg/dL (ref 0–40)

## 2011-12-27 MED ORDER — EZETIMIBE-SIMVASTATIN 10-20 MG PO TABS: 1.0000 | ORAL_TABLET | ORAL | Status: DC

## 2011-12-27 MED ORDER — PANTOPRAZOLE SODIUM 40 MG PO TBEC: 40.0000 mg | DELAYED_RELEASE_TABLET | Freq: Every day | ORAL | Status: DC

## 2011-12-27 MED ORDER — ALPRAZOLAM 0.25 MG PO TABS: 0.2500 mg | ORAL_TABLET | Freq: Two times a day (BID) | ORAL | Status: AC | PRN

## 2011-12-27 NOTE — Progress Notes (Signed)
  Subjective:    Patient ID: Erika Johns, female    DOB: 16-Aug-1951, 60 y.o.   MRN: [id number]  HPI patient here to discuss her refills on her medications period. She is very physically active but has been less physically active due to her husband's illness. He suffers from myelofibrosis. He was very ill with pneumonia but has subsequently recovered. Trips planned to  [place] and an [place] cruise planned.. She is in good health sheet she is on cholesterol medications she recently had her mammogram and colonoscopy. She's currently on Vytorin for control of her lipids and last lipid panel was in range. Shee takes protonix for reflux. She's recently had some trouble with her back and is seeing an orthopedist with injections in her back.She sees Dr. Vira Blanco.    Review of Systems she still worries about her husband. She is under a lot of stress regarding this. She is seeing Karmen Bongo and talk to him about it     Objective:   Physical Exam  Constitutional: She appears well-nourished.  HENT:  Head: Normocephalic and atraumatic.  Eyes: Pupils are equal, round, and reactive to light.  Neck: No thyromegaly present.  Cardiovascular: Normal rate, regular rhythm, normal heart sounds and intact distal pulses.  Exam reveals no gallop and no friction rub.   No murmur heard. Pulmonary/Chest: No respiratory distress. She has no wheezes. She has no rales. She exhibits no tenderness.  Abdominal: She exhibits no distension and no mass. There is no tenderness. There is no rebound and no guarding.  Skin: Skin is warm and dry.    EKG NSR     Assessment & Plan:  Patient is doing very well physically. She is under a lot of stress. I've advised her to see Karmen Bongo again and talk with him about her issues.

## 2011-12-28 LAB — VITAMIN D 25 HYDROXY (VIT D DEFICIENCY, FRACTURES): Vit D, 25-Hydroxy: 53 ng/mL (ref 30–89)

## 2011-12-29 ENCOUNTER — Encounter: Payer: Self-pay | Admitting: Emergency Medicine

## 2012-01-03 ENCOUNTER — Telehealth: Payer: Self-pay

## 2012-01-03 NOTE — Telephone Encounter (Signed)
Pt states dr Cleta Alberts prescribed vytorin, but [employer] states this medication requires prior approval.  Please call [employer] for prior authorization at [phone]

## 2012-01-03 NOTE — Telephone Encounter (Signed)
Completed and faxed form to [employer] (w/confirmation) and [facility] for pt that has been done. Asked pt to CB if she gets notified that PA has been denied and Dr Cleta Alberts will Rx something else.

## 2012-03-20 ENCOUNTER — Telehealth: Payer: Self-pay

## 2012-03-20 NOTE — Telephone Encounter (Signed)
[place] needs nurse of auth to change pt rx generic brand please contact [place] from [employer] [phone] [id number]

## 2012-03-21 NOTE — Telephone Encounter (Signed)
Is for Vytorin, have got the fax on my desk, will address this am.

## 2012-04-28 ENCOUNTER — Ambulatory Visit (INDEPENDENT_AMBULATORY_CARE_PROVIDER_SITE_OTHER): Payer: BC Managed Care – PPO | Admitting: Family Medicine

## 2012-04-28 VITALS — BP 128/74 | HR 64 | Temp 98.5°F | Resp 16 | Ht 70.5 in | Wt 147.0 lb

## 2012-04-28 DIAGNOSIS — J329 Chronic sinusitis, unspecified: Secondary | ICD-10-CM

## 2012-04-28 DIAGNOSIS — Z23 Encounter for immunization: Secondary | ICD-10-CM

## 2012-04-28 DIAGNOSIS — H6990 Unspecified Eustachian tube disorder, unspecified ear: Secondary | ICD-10-CM

## 2012-04-28 DIAGNOSIS — H6980 Other specified disorders of Eustachian tube, unspecified ear: Secondary | ICD-10-CM

## 2012-04-28 MED ORDER — AZITHROMYCIN 250 MG PO TABS: ORAL_TABLET | ORAL | Status: AC

## 2012-04-28 MED ORDER — FLUTICASONE PROPIONATE 50 MCG/ACT NA SUSP: 2.0000 | Freq: Every day | NASAL | Status: DC

## 2012-04-28 MED ORDER — PREDNISONE 20 MG PO TABS: ORAL_TABLET | ORAL | Status: AC

## 2012-04-28 NOTE — Progress Notes (Signed)
[facility] [address] [place] [address] [phone]  Date:  04/28/2012   Name:  Erika Johns   DOB:  Jan 24, 1951   MRN:  [id number]  PCP:  Lucilla Edin, MD    Chief Complaint: Sinusitis   History of Present Illness:  Erika Johns is a 61 y.o. very pleasant female patient who presents with the following:  She went on a trip 3 weeks ago- flew to [place], and then went on a cruise to [place]. She noted some pressure in her sinuses, and that her left ear felt like it needed to pop.  She has used OTC cold medications, but the ear has not seemed to clear.  She has not had a cough, fever, or GI symptoms. Otherwise she is generally healthy  Patient Active Problem List  Diagnosis  . KNEE PAIN  . LUMBAGO  . TROCHANTERIC BURSITIS  . HAND PAIN, LEFT    No past medical history on file.  No past surgical history on file.  History  Substance Use Topics  . Smoking status: Never Smoker   . Smokeless tobacco: Not on file  . Alcohol Use: Not on file    No family history on file.  Allergies  Allergen Reactions  . Ampicillin   . Effexor (Venlafaxine Hydrochloride) Other (See Comments)    dizzy    Medication list has been reviewed and updated.  Current Outpatient Prescriptions on File Prior to Visit  Medication Sig Dispense Refill  . Calcium Carbonate-Vitamin D (CALCIUM + D PO) Take by mouth.      . Cholecalciferol (VITAMIN D3) 1000 UNITS CAPS Take by mouth daily.      Marland Kitchen conjugated estrogens (PREMARIN) vaginal cream Place vaginally as needed.      . ezetimibe-simvastatin (VYTORIN) 10-20 MG per tablet Take 1 tablet by mouth every other day.  90 tablet  3  . Magnesium 250 MG TABS Take by mouth daily.      . Methylcellulose, Laxative, (CITRUCEL PO) Take by mouth.      . Omega-3 Fatty Acids (SALMON OIL-1000 PO) Take by mouth daily.      . pantoprazole (PROTONIX) 40 MG tablet Take 1 tablet (40 mg total) by mouth daily.  30 tablet  11    . Probiotic Product (PRO-BIOTIC BLEND PO) Take by mouth daily.      Marland Kitchen zolpidem (AMBIEN) 10 MG tablet Take 10 mg by mouth at bedtime as needed.      . Ascorbic Acid (VITAMIN C) 1000 MG tablet Take 1,000 mg by mouth daily.      Marland Kitchen DISCONTD: fluticasone (FLONASE) 50 MCG/ACT nasal spray USE 1-2 SPRAYS IN EACH NOSTRIL EVERY DAY  16 g  1    Review of Systems:  As per HPI- otherwise negative.  Physical Examination: Filed Vitals:   04/28/12 1136  BP: 128/74  Pulse: 64  Temp: 98.5 F (36.9 C)  Resp: 16   Filed Vitals:   04/28/12 1136  Height: 5' 10.5" (1.791 m)  Weight: 147 lb (66.679 kg)   Body mass index is 20.79 kg/(m^2). Ideal Body Weight: Weight in (lb) to have BMI = 25: 176.4   GEN: WDWN, NAD, Non-toxic, A & O x 3 HEENT: Atraumatic, Normocephalic. Neck supple. No masses, No LAD. Right TM and oropharynx wnl, PEERL.  Left TM is obscured by cerumen Ears and Nose: No external deformity. CV: RRR, No M/G/R. No JVD. No thrill. No extra heart sounds. PULM: CTA B, no  wheezes, crackles, rhonchi. No retractions. No resp. distress. No accessory muscle use. EXTR: No c/c/e NEURO Normal gait.  PSYCH: Normally interactive. Conversant. Not depressed or anxious appearing.  Calm demeanor.    Assessment and Plan: 1. ETD (eustachian tube dysfunction)    2. Sinusitis  azithromycin (ZITHROMAX) 250 MG tablet, fluticasone (FLONASE) 50 MCG/ACT nasal spray, predniSONE (DELTASONE) 20 MG tablet  3. Immunization due  Flu vaccine greater than or equal to 3yo preservative free IM   Suspect that [name] is having persistent ETD in her left ear.  She would like to try other treatment before resorting to antibiotics, so will try flonase and a short course of prednisone first.  However, did give her a zpack to use if this does not resolve her symptoms.  She will let me know if she is not better within the next few days- Sooner if worse.     Abbe Amsterdam, MD

## 2012-05-12 ENCOUNTER — Ambulatory Visit (INDEPENDENT_AMBULATORY_CARE_PROVIDER_SITE_OTHER): Payer: BC Managed Care – PPO | Admitting: Emergency Medicine

## 2012-05-12 VITALS — BP 110/77 | HR 81 | Temp 99.0°F | Resp 18 | Ht 70.5 in | Wt 146.0 lb

## 2012-05-12 DIAGNOSIS — M25569 Pain in unspecified knee: Secondary | ICD-10-CM

## 2012-05-12 DIAGNOSIS — H6690 Otitis media, unspecified, unspecified ear: Secondary | ICD-10-CM

## 2012-05-12 DIAGNOSIS — K6289 Other specified diseases of anus and rectum: Secondary | ICD-10-CM

## 2012-05-12 MED ORDER — HYDROCORTISONE 2.5 % RE CREA: TOPICAL_CREAM | Freq: Two times a day (BID) | RECTAL | Status: DC

## 2012-05-12 MED ORDER — TRAMADOL HCL 50 MG PO TABS: 50.0000 mg | ORAL_TABLET | Freq: Three times a day (TID) | ORAL | Status: DC | PRN

## 2012-05-12 MED ORDER — AZELASTINE HCL 0.1 % NA SOLN: 2.0000 | Freq: Two times a day (BID) | NASAL | Status: DC

## 2012-05-12 MED ORDER — LEVOFLOXACIN 500 MG PO TABS: 500.0000 mg | ORAL_TABLET | Freq: Every day | ORAL | Status: AC

## 2012-05-12 NOTE — Progress Notes (Signed)
  Subjective:    Patient ID: Erika Johns, female    DOB: November 11, 1950, 61 y.o.   MRN: [id number]  HPI patient enters with discomfort in her left ear. She recently completed a course of Zithromax and prednisone but continues to have full sensation in her ear and is unable to clear her ear. He is on Flonase and it helped some but she still feels congested the    Review of Systems patient also requesting refill of her medication she uses for hemorrhoidal disease. She also on her trip to [place] 7 injury to her left knee for which she sees Dr. Talbert Nan. She is requesting a trauma Panadol to see how she does with that.     Objective:   Physical Exam only a portion of the TM is seen but appears gray I'm not sure whether there is fluid behind the drum or not. Her ear canals are tiny. The throat is normal the nose is normal her chest is        Assessment & Plan:  We'll check an audiogram to see if she has a hearing loss in this year plan on treatment with Astelin and a different antibiotic. Audiogram shows bilateral hearing loss left greater than right the patient to continue Flonase we'll add Astelin spray along with Levaquin. She will also be given some tramadol to take for her left knee discomfort she was also given a prescription for Proctosol HC cream which she uses for hemorrhoid disease

## 2012-05-12 NOTE — Progress Notes (Signed)
  Subjective:    Patient ID: Erika Johns, female    DOB: 1951-06-27, 61 y.o.   MRN: [id number]  HPI    Review of Systems     Objective:   Physical Exam        Assessment & Plan:

## 2012-06-04 ENCOUNTER — Ambulatory Visit (INDEPENDENT_AMBULATORY_CARE_PROVIDER_SITE_OTHER): Payer: BC Managed Care – PPO | Admitting: Emergency Medicine

## 2012-06-04 VITALS — BP 126/76 | HR 78 | Temp 98.6°F | Resp 16 | Ht 70.5 in | Wt 151.0 lb

## 2012-06-04 DIAGNOSIS — W57XXXA Bitten or stung by nonvenomous insect and other nonvenomous arthropods, initial encounter: Secondary | ICD-10-CM

## 2012-06-04 DIAGNOSIS — T148 Other injury of unspecified body region: Secondary | ICD-10-CM

## 2012-06-04 MED ORDER — METHYLPREDNISOLONE ACETATE 80 MG/ML IJ SUSP
120.0000 mg | Freq: Once | INTRAMUSCULAR | Status: AC
  Administered 2012-06-04: 120 mg via INTRAMUSCULAR

## 2012-06-04 NOTE — Progress Notes (Signed)
[facility] [address] [place] [address] [phone]  Date:  06/04/2012   Name:  Erika Johns   DOB:  Jan 29, 1951   MRN:  [id number]  PCP:  Lucilla Edin, MD    Chief Complaint: Insect Bite   History of Present Illness:  Erika Johns is a 61 y.o. very pleasant female patient who presents with the following:  Stung by an insect on left forearm outside on Saturday and has erythematous and swollen forearm medial aspect.  Has been using benadryl and caladryl lotion.  No respiratory distress or wheezing and no history or prior allergic reaction.  Denies other complaints  Patient Active Problem List  Diagnosis  . KNEE PAIN  . LUMBAGO  . TROCHANTERIC BURSITIS  . HAND PAIN, LEFT    No past medical history on file.  No past surgical history on file.  History  Substance Use Topics  . Smoking status: Never Smoker   . Smokeless tobacco: Not on file  . Alcohol Use: Not on file    No family history on file.  Allergies  Allergen Reactions  . Ampicillin   . Effexor (Venlafaxine Hydrochloride) Other (See Comments)    dizzy    Medication list has been reviewed and updated.  Current Outpatient Prescriptions on File Prior to Visit  Medication Sig Dispense Refill  . Ascorbic Acid (VITAMIN C) 1000 MG tablet Take 1,000 mg by mouth daily.      Marland Kitchen azelastine (ASTELIN) 137 MCG/SPRAY nasal spray Place 2 sprays into the nose 2 (two) times daily. Use in each nostril as directed  30 mL  12  . Calcium Carbonate-Vitamin D (CALCIUM + D PO) Take by mouth.      . Cholecalciferol (VITAMIN D3) 1000 UNITS CAPS Take by mouth daily.      Marland Kitchen conjugated estrogens (PREMARIN) vaginal cream Place vaginally as needed.      . ezetimibe-simvastatin (VYTORIN) 10-20 MG per tablet Take 1 tablet by mouth every other day.  90 tablet  3  . fluticasone (FLONASE) 50 MCG/ACT nasal spray Place 2 sprays into the nose daily.  16 g  9  . hydrocortisone (ANUSOL-HC) 2.5  % rectal cream Place rectally 2 (two) times daily.  30 g  11  . Magnesium 250 MG TABS Take by mouth daily.      . Methylcellulose, Laxative, (CITRUCEL PO) Take by mouth.      . Omega-3 Fatty Acids (SALMON OIL-1000 PO) Take by mouth daily.      . Probiotic Product (PRO-BIOTIC BLEND PO) Take by mouth daily.      . traMADol (ULTRAM) 50 MG tablet Take 1 tablet (50 mg total) by mouth every 8 (eight) hours as needed for pain.  40 tablet  1  . zolpidem (AMBIEN) 10 MG tablet Take 10 mg by mouth at bedtime as needed.      . pantoprazole (PROTONIX) 40 MG tablet Take 1 tablet (40 mg total) by mouth daily.  30 tablet  11    Review of Systems:  As per HPI, otherwise negative.    Physical Examination: Filed Vitals:   06/04/12 1411  BP: 126/76  Pulse: 78  Temp: 98.6 F (37 C)  Resp: 16   Filed Vitals:   06/04/12 1411  Height: 5' 10.5" (1.791 m)  Weight: 151 lb (68.493 kg)   Body mass index is 21.36 kg/(m^2). Ideal Body Weight: Weight in (lb) to have BMI = 25: 176.4    GEN:  WDWN, NAD, Non-toxic, Alert & Oriented x 3 HEENT: Atraumatic, Normocephalic.  Ears and Nose: No external deformity. Chest:  Clear bs = EXTR: No clubbing/cyanosis/edema NEURO: Normal gait.  PSYCH: Normally interactive. Conversant. Not depressed or anxious appearing.  Calm demeanor.  ARM:  Right arm swollen and red.  No lymphangitis   Assessment and Plan: Allergic reaction forearm secondary to beesting Continue benadryl Depo medrol Follow up as needed  Carmelina Dane, MD  I have reviewed and agree with documentation. Robert P. Merla Riches, M.D.

## 2012-06-13 ENCOUNTER — Encounter: Payer: Self-pay | Admitting: Family Medicine

## 2012-07-27 ENCOUNTER — Telehealth: Payer: Self-pay

## 2012-07-27 NOTE — Telephone Encounter (Signed)
Patient was prescribed a generic of vytorin this month because of insurance change and she is having diarrhea because of it. She is wondering if this is normal and if she should go back to the vytorin even though it would cost her $100 vs $30 for generic.  Best [phone]

## 2012-07-28 NOTE — Telephone Encounter (Signed)
The Zetia component of this can cause diarrhea. She can go back to the name brand if she would like or since her lipids looked good we can also try her on just a statin alone and have her recheck her cholesterol on just a statin. Just have her let us know what she would like to do.

## 2012-07-29 NOTE — Telephone Encounter (Signed)
LMOM to CB. 

## 2012-07-31 NOTE — Telephone Encounter (Signed)
Advised pt of notes. Pt stated that will stay off med for one week and see how that will do and will restart.  She has been under a lot of stress lately and thinks that may have something to do with it.  If she has anything else going on she will call and let us know.

## 2012-09-21 ENCOUNTER — Other Ambulatory Visit: Payer: Self-pay | Admitting: Emergency Medicine

## 2012-09-21 NOTE — Telephone Encounter (Signed)
Needs office visit for further refill ?

## 2012-10-31 ENCOUNTER — Ambulatory Visit (INDEPENDENT_AMBULATORY_CARE_PROVIDER_SITE_OTHER): Payer: BC Managed Care – PPO | Admitting: Family Medicine

## 2012-10-31 VITALS — BP 130/78 | HR 79 | Temp 99.4°F | Resp 18 | Wt 151.0 lb

## 2012-10-31 DIAGNOSIS — J069 Acute upper respiratory infection, unspecified: Secondary | ICD-10-CM

## 2012-10-31 DIAGNOSIS — R062 Wheezing: Secondary | ICD-10-CM

## 2012-10-31 DIAGNOSIS — G4700 Insomnia, unspecified: Secondary | ICD-10-CM

## 2012-10-31 DIAGNOSIS — R05 Cough: Secondary | ICD-10-CM

## 2012-10-31 DIAGNOSIS — R059 Cough, unspecified: Secondary | ICD-10-CM

## 2012-10-31 MED ORDER — ALBUTEROL SULFATE HFA 108 (90 BASE) MCG/ACT IN AERS: 2.0000 | INHALATION_SPRAY | Freq: Four times a day (QID) | RESPIRATORY_TRACT | Status: DC | PRN

## 2012-10-31 MED ORDER — HYDROCODONE-HOMATROPINE 5-1.5 MG/5ML PO SYRP: 5.0000 mL | ORAL_SOLUTION | Freq: Every evening | ORAL | Status: DC | PRN

## 2012-10-31 MED ORDER — ZOLPIDEM TARTRATE 10 MG PO TABS: 10.0000 mg | ORAL_TABLET | Freq: Every evening | ORAL | Status: DC | PRN

## 2012-10-31 MED ORDER — AZITHROMYCIN 250 MG PO TABS: ORAL_TABLET | ORAL | Status: DC

## 2012-10-31 NOTE — Progress Notes (Signed)
[facility]:  Office Visit  Chief Complaint:  Chief Complaint  Patient presents with  . Cough    asthma  . Sore Throat    HPI: Erika Johns is a 62 y.o. female who complains of  3 days ago, she had a sore throat. Normally with a cold she has a sore throat but this was different. She has frontal HA, Sore throat. Has asthmatic cough, feels it in chest. She has been using flonase , astelin and alkaselzer plus and tylenol. Gargeled with salt water x 3.  She feels miserable. Wheezing during the day. Not at night. + Allergies. No asthma. Her husband has myelofibrosis and she does not want to get him sick. She is UTD on her flu vaccine.  She also would like refills on her Remus Loffler, She takes 1/2 tab of 10 mg tabs. She has both difficulty sleeping and also maintaining sleep. She was given rx by Ob/Gyn who she will see in August. Asking for refills for now until she sees OB.  Past Medical History  Diagnosis Date  . Allergy   . Arthritis   . Insomnia    Past Surgical History  Procedure Laterality Date  . Fracture surgery     History   Social History  . Marital Status: Married    Spouse Name: N/A    Number of Children: N/A  . Years of Education: N/A   Social History Main Topics  . Smoking status: Never Smoker   . Smokeless tobacco: None  . Alcohol Use: Yes  . Drug Use: No  . Sexually Active: Yes   Other Topics Concern  . None   Social History Narrative  . None   History reviewed. No pertinent family history. Allergies  Allergen Reactions  . Ampicillin   . Effexor (Venlafaxine Hydrochloride) Other (See Comments)    dizzy   Prior to Admission medications   Medication Sig Start Date End Date Taking? Authorizing Provider  Ascorbic Acid (VITAMIN C) 1000 MG tablet Take 1,000 mg by mouth daily.   Yes Historical Provider, MD  azelastine (ASTELIN) 137 MCG/SPRAY nasal spray Place 2 sprays into the nose 2 (two) times daily. Use in each nostril as  directed 05/12/12  Yes Collene Gobble, MD  Calcium Carbonate-Vitamin D (CALCIUM + D PO) Take by mouth.   Yes Historical Provider, MD  Cholecalciferol (VITAMIN D3) 1000 UNITS CAPS Take by mouth daily.   Yes Historical Provider, MD  conjugated estrogens (PREMARIN) vaginal cream Place vaginally as needed.   Yes Historical Provider, MD  ezetimibe-simvastatin (VYTORIN) 10-20 MG per tablet Take 1 tablet by mouth every other day. 12/27/11  Yes Collene Gobble, MD  fluticasone (FLONASE) 50 MCG/ACT nasal spray Place 2 sprays into the nose daily. 04/28/12  Yes Gwenlyn Found Copland, MD  pantoprazole (PROTONIX) 40 MG tablet Take 1 tablet (40 mg total) by mouth daily. 12/27/11 12/26/12 Yes Collene Gobble, MD  traMADol (ULTRAM) 50 MG tablet Take 1 tablet (50 mg total) by mouth every 8 (eight) hours as needed for pain. Needs office visit for further refill 09/21/12  Yes Ryan M Dunn, PA-C  zolpidem (AMBIEN) 10 MG tablet Take 10 mg by mouth at bedtime as needed.   Yes Historical Provider, MD     ROS: The patient denies fevers, chills, night sweats, unintentional weight loss, chest pain, palpitations, wheezing, dyspnea on exertion, nausea, vomiting, abdominal pain, dysuria, hematuria, melena, numbness, weakness, or tingling.   All other systems have been  reviewed and were otherwise negative with the exception of those mentioned in the HPI and as above.    PHYSICAL EXAM: Filed Vitals:   10/31/12 1929  BP: 130/78  Pulse: 79  Temp: 99.4 F (37.4 C)  Resp: 18   Filed Vitals:   10/31/12 1929  Weight: 151 lb (68.493 kg)   Body mass index is 21.35 kg/(m^2).  General: Alert, no acute distress HEENT:  Normocephalic, atraumatic, oropharynx patent. Tm nl, + Sinus tenderness, no erythema/exudates of OP Cardiovascular:  Regular rate and rhythm, no rubs murmurs or gallops.  No Carotid bruits, radial pulse intact. No pedal edema.  Respiratory: Clear to auscultation bilaterally.  No wheezes, rales, or rhonchi.  No cyanosis, no use  of accessory musculature GI: No organomegaly, abdomen is soft and non-tender, positive bowel sounds.  No masses. Skin: No rashes. Neurologic: Facial musculature symmetric. Psychiatric: Patient is appropriate throughout our interaction. Lymphatic: No cervical lymphadenopathy Musculoskeletal: Gait intact.   LABS: Results for orders placed in visit on 12/27/11  CBC WITH DIFFERENTIAL      Result Value Range   WBC 4.2  4.0 - 10.5 K/uL   RBC 4.54  3.87 - 5.11 MIL/uL   Hemoglobin 13.6  12.0 - 15.0 g/dL   HCT 40.9  81.1 - 91.4 %   MCV 89.0  78.0 - 100.0 fL   MCH 30.0  26.0 - 34.0 pg   MCHC 33.7  30.0 - 36.0 g/dL   RDW 78.2  95.6 - 21.3 %   Platelets 162  150 - 400 K/uL   Neutrophils Relative 48  43 - 77 %   Neutro Abs 2.0  1.7 - 7.7 K/uL   Lymphocytes Relative 39  12 - 46 %   Lymphs Abs 1.6  0.7 - 4.0 K/uL   Monocytes Relative 9  3 - 12 %   Monocytes Absolute 0.4  0.1 - 1.0 K/uL   Eosinophils Relative 4  0 - 5 %   Eosinophils Absolute 0.2  0.0 - 0.7 K/uL   Basophils Relative 0  0 - 1 %   Basophils Absolute 0.0  0.0 - 0.1 K/uL   Smear Review Criteria for review not met    LIPID PANEL      Result Value Range   Cholesterol 162  0 - 200 mg/dL   Triglycerides 65  <086 mg/dL   HDL 55  >57 mg/dL   Total CHOL/HDL Ratio 2.9     VLDL 13  0 - 40 mg/dL   LDL Cholesterol 94  0 - 99 mg/dL  TSH      Result Value Range   TSH 3.160  0.350 - 4.500 uIU/mL  COMPREHENSIVE METABOLIC PANEL      Result Value Range   Sodium 141  135 - 145 mEq/L   Potassium 4.2  3.5 - 5.3 mEq/L   Chloride 108  96 - 112 mEq/L   CO2 25  19 - 32 mEq/L   Glucose, Bld 96  70 - 99 mg/dL   BUN 17  6 - 23 mg/dL   Creat 8.46  9.62 - 9.52 mg/dL   Total Bilirubin 0.5  0.3 - 1.2 mg/dL   Alkaline Phosphatase 51  39 - 117 U/L   AST 18  0 - 37 U/L   ALT 12  0 - 35 U/L   Total Protein 6.5  6.0 - 8.3 g/dL   Albumin 4.1  3.5 - 5.2 g/dL   Calcium 9.0  8.4 - 84.1 mg/dL  VITAMIN D 25 HYDROXY      Result Value Range   Vit D,  25-Hydroxy 53  30 - 89 ng/mL     EKG/XRAY:   Primary read interpreted by Dr. Conley Rolls at [facility].   ASSESSMENT/PLAN: Encounter Diagnoses  Name Primary?  . Cough Yes  . Viral URI   . Wheezes   . Insomnia    Rx Hydromet OTC Robitussin Rx Z pack to be used in next 3-4 days if no improvement, she lives with husband who is immune compromised, has Myelofibrosis Rx Ambien F/u prn    LE, THAO PHUONG, DO 11/01/2012 1:35 AM

## 2012-11-01 ENCOUNTER — Encounter: Payer: Self-pay | Admitting: Family Medicine

## 2012-11-20 ENCOUNTER — Encounter: Payer: Self-pay | Admitting: Emergency Medicine

## 2012-11-20 ENCOUNTER — Encounter: Payer: Self-pay | Admitting: Internal Medicine

## 2012-11-28 ENCOUNTER — Ambulatory Visit (INDEPENDENT_AMBULATORY_CARE_PROVIDER_SITE_OTHER): Payer: BC Managed Care – PPO | Admitting: Internal Medicine

## 2012-11-28 VITALS — BP 124/72 | HR 72 | Temp 98.5°F | Resp 18 | Ht 70.0 in | Wt 145.0 lb

## 2012-11-28 DIAGNOSIS — J209 Acute bronchitis, unspecified: Secondary | ICD-10-CM

## 2012-11-28 DIAGNOSIS — R059 Cough, unspecified: Secondary | ICD-10-CM

## 2012-11-28 DIAGNOSIS — R05 Cough: Secondary | ICD-10-CM

## 2012-11-28 DIAGNOSIS — K5900 Constipation, unspecified: Secondary | ICD-10-CM

## 2012-11-28 MED ORDER — HYDROCODONE-HOMATROPINE 5-1.5 MG/5ML PO SYRP: 5.0000 mL | ORAL_SOLUTION | Freq: Every evening | ORAL | Status: DC | PRN

## 2012-11-28 MED ORDER — BENZONATATE 200 MG PO CAPS: 200.0000 mg | ORAL_CAPSULE | Freq: Two times a day (BID) | ORAL | Status: DC | PRN

## 2012-11-28 NOTE — Patient Instructions (Signed)
Zithromax as directed. Tessalon perle 1 tab every 8 hours as needed for cough. Increase fluids. See your gi dr as directed for follow up re your IBS. If your symptoms worsen return to the office.

## 2012-11-28 NOTE — Progress Notes (Signed)
  Subjective:    Patient ID: Erika Johns, female    DOB: 1951/05/21, 62 y.o.   MRN: [id number]  Cough Pertinent negatives include no shortness of breath or wheezing.    cough for 3 weeks. Was hee and got an rx for cough meds and antibiotic. Did not fill the rx but has been taking the cough medication. Felt better for a week now with cough again. No sputun no fever but has chills and fatigue. Ran our of cough medication.also has ibs and chronic constipation and is seeing a gi dr for this just started on new meds fo ribs.   Review of Systems  Respiratory: Positive for cough. Negative for shortness of breath, wheezing and stridor.   Gastrointestinal: Positive for constipation.  All other systems reviewed and are negative.       Objective:   Physical Exam  Constitutional: She is oriented to person, place, and time. She appears well-developed and well-nourished.  HENT:  Head: Normocephalic and atraumatic.  Right Ear: External ear normal.  Left Ear: External ear normal.  Nose: Nose normal.  Mouth/Throat: Oropharynx is clear and moist.  Eyes: Conjunctivae and EOM are normal. Pupils are equal, round, and reactive to light.  Neck: Normal range of motion. Neck supple.  Cardiovascular: Normal rate, regular rhythm, normal heart sounds and intact distal pulses.   Pulmonary/Chest: Effort normal and breath sounds normal.  Bronchospastic cough no sputum  Abdominal: Soft. Bowel sounds are normal.  Musculoskeletal: Normal range of motion.  Neurological: She is alert and oriented to person, place, and time.  Skin: Skin is warm and dry.  Psychiatric: She has a normal mood and affect. Her behavior is normal. Judgment and thought content normal.          Assessment & Plan:  Cough for 3 weeks in a nonsmoker who had bronchitis but did not fill the rx. Will recommend she take the Zithromax and will suggest tessalon perles because of chronic constipation.

## 2012-12-07 ENCOUNTER — Ambulatory Visit (INDEPENDENT_AMBULATORY_CARE_PROVIDER_SITE_OTHER): Payer: BC Managed Care – PPO | Admitting: Emergency Medicine

## 2012-12-07 ENCOUNTER — Ambulatory Visit: Payer: BC Managed Care – PPO

## 2012-12-07 VITALS — BP 112/68 | HR 68 | Temp 97.7°F | Resp 16 | Ht 70.0 in | Wt 145.0 lb

## 2012-12-07 DIAGNOSIS — E785 Hyperlipidemia, unspecified: Secondary | ICD-10-CM

## 2012-12-07 DIAGNOSIS — R059 Cough, unspecified: Secondary | ICD-10-CM

## 2012-12-07 DIAGNOSIS — R05 Cough: Secondary | ICD-10-CM

## 2012-12-07 DIAGNOSIS — R911 Solitary pulmonary nodule: Secondary | ICD-10-CM

## 2012-12-07 DIAGNOSIS — M549 Dorsalgia, unspecified: Secondary | ICD-10-CM

## 2012-12-07 DIAGNOSIS — B009 Herpesviral infection, unspecified: Secondary | ICD-10-CM

## 2012-12-07 LAB — COMPREHENSIVE METABOLIC PANEL
AST: 17 U/L (ref 0–37)
Albumin: 4.2 g/dL (ref 3.5–5.2)
Alkaline Phosphatase: 59 U/L (ref 39–117)
Potassium: 3.9 mEq/L (ref 3.5–5.3)
Sodium: 141 mEq/L (ref 135–145)
Total Protein: 6.5 g/dL (ref 6.0–8.3)

## 2012-12-07 LAB — POCT CBC
Granulocyte percent: 62.3 %G (ref 37–80)
MCV: 92.2 fL (ref 80–97)
MID (cbc): 0.4 (ref 0–0.9)
MPV: 9 fL (ref 0–99.8)
POC MID %: 9.1 %M (ref 0–12)
Platelet Count, POC: 189 10*3/uL (ref 142–424)
RBC: 4.83 M/uL (ref 4.04–5.48)

## 2012-12-07 LAB — TSH: TSH: 2.256 u[IU]/mL (ref 0.350–4.500)

## 2012-12-07 LAB — LIPID PANEL: LDL Cholesterol: 88 mg/dL (ref 0–99)

## 2012-12-07 MED ORDER — VALACYCLOVIR HCL 1 G PO TABS: ORAL_TABLET | ORAL | Status: DC

## 2012-12-07 MED ORDER — TRAMADOL HCL 50 MG PO TABS: 50.0000 mg | ORAL_TABLET | Freq: Three times a day (TID) | ORAL | Status: DC | PRN

## 2012-12-07 NOTE — Progress Notes (Signed)
  Subjective:    Patient ID: Erika Johns, female    DOB: 1951-08-21, 62 y.o.   MRN: [id number]  HPI patient here to have followup blood work done for high cholesterol. She also needs refills on her Ultram and would like to get a prescription for Valtrex which she takes for cold sores. She recently took her Z-Pak for respiratory infection and does feel some better for that she does still have a mild cough. She has a form to be completed that needs checking of her cholesterol and glucose.    Review of Systems     Objective:   Physical Exam patient is alert and cooperative and in no distress. TMs are clear. Her throat is normal. Her neck is supple. Thyroid not enlarged. Chest is clear to both auscultation and percussion.  Results for orders placed in visit on 12/07/12  POCT CBC      Result Value Range   WBC 4.8  4.6 - 10.2 K/uL   Lymph, poc 1.4  0.6 - 3.4   POC LYMPH PERCENT 28.6  10 - 50 %L   MID (cbc) 0.4  0 - 0.9   POC MID % 9.1  0 - 12 %M   POC Granulocyte 3.0  2 - 6.9   Granulocyte percent 62.3  37 - 80 %G   RBC 4.83  4.04 - 5.48 M/uL   Hemoglobin 14.4  12.2 - 16.2 g/dL   HCT, POC 95.6  21.3 - 47.9 %   MCV 92.2  80 - 97 fL   MCH, POC 29.8  27 - 31.2 pg   MCHC 32.4  31.8 - 35.4 g/dL   RDW, POC 08.6     Platelet Count, POC 189  142 - 424 K/uL   MPV 9.0  0 - 99.8 fL  GLUCOSE, POCT (MANUAL RESULT ENTRY)      Result Value Range   POC Glucose 87  70 - 99 mg/dl   [facility] reading (PRIMARY) by  Dr. Cleta Alberts Mild increased markings in the bases       Assessment & Plan:   Will refill Ultram and Valtrex. Her Bronchitis seems to be improving.  Has normal white count and normal CXR however on radiology review there is a questionable nodule present. On radiology review there is a questionable nodule lateral chest. I have discussed this with the patient we'll proceed with CT no contrast.

## 2012-12-14 ENCOUNTER — Telehealth: Payer: Self-pay

## 2012-12-14 ENCOUNTER — Ambulatory Visit
Admission: RE | Admit: 2012-12-14 | Discharge: 2012-12-14 | Disposition: A | Discharge status: Home / Self Care | Payer: BC Managed Care – PPO | Source: Ambulatory Visit | Attending: Emergency Medicine | Admitting: Emergency Medicine

## 2012-12-14 ENCOUNTER — Encounter: Payer: Self-pay | Admitting: Radiology

## 2012-12-14 DIAGNOSIS — R911 Solitary pulmonary nodule: Secondary | ICD-10-CM

## 2012-12-14 NOTE — Telephone Encounter (Signed)
CT normal no lung nodules seen, called her to advise

## 2012-12-14 NOTE — Telephone Encounter (Signed)
Pt seen Erika Johns the other day and he sent her to have a CT scan done today at [facility] she is wanting to know her results Call back number is [phone] until 5:30 after then [phone]

## 2012-12-28 ENCOUNTER — Other Ambulatory Visit: Payer: Self-pay | Admitting: Gastroenterology

## 2012-12-31 ENCOUNTER — Ambulatory Visit (HOSPITAL_COMMUNITY)
Admission: RE | Admit: 2012-12-31 | Discharge: 2012-12-31 | Disposition: A | Discharge status: Home / Self Care | Payer: BC Managed Care – PPO | Source: Ambulatory Visit | Attending: Gastroenterology | Admitting: Gastroenterology

## 2012-12-31 ENCOUNTER — Encounter (HOSPITAL_COMMUNITY): Payer: Self-pay | Admitting: *Deleted

## 2012-12-31 ENCOUNTER — Encounter (HOSPITAL_COMMUNITY)
Admission: RE | Disposition: A | Discharge status: Home / Self Care | Payer: Self-pay | Source: Ambulatory Visit | Attending: Gastroenterology

## 2012-12-31 ENCOUNTER — Other Ambulatory Visit: Payer: Self-pay | Admitting: Gastroenterology

## 2012-12-31 DIAGNOSIS — K5900 Constipation, unspecified: Secondary | ICD-10-CM | POA: Insufficient documentation

## 2012-12-31 DIAGNOSIS — K589 Irritable bowel syndrome without diarrhea: Secondary | ICD-10-CM | POA: Insufficient documentation

## 2012-12-31 DIAGNOSIS — E7800 Pure hypercholesterolemia, unspecified: Secondary | ICD-10-CM | POA: Insufficient documentation

## 2012-12-31 DIAGNOSIS — R109 Unspecified abdominal pain: Secondary | ICD-10-CM | POA: Insufficient documentation

## 2012-12-31 DIAGNOSIS — G8929 Other chronic pain: Secondary | ICD-10-CM | POA: Insufficient documentation

## 2012-12-31 SURGERY — MANOMETRY, ANORECTAL

## 2013-01-01 ENCOUNTER — Telehealth: Payer: Self-pay | Admitting: Radiology

## 2013-01-01 NOTE — Telephone Encounter (Signed)
Patient needs additional views of her Mammogram, Dr Cleta Alberts wants her to go for this, she is recovering from a lower GI procedure, will call her later in the week to see how she is and when she plans to go for the additional mammogram views. Erika Johns

## 2013-01-01 NOTE — H&P (Signed)
Problem: Chronic abdominal pain and constipation predominant irritable bowel syndrome  History: The patient is a 62 year old female born December 12, 1950. The patient has chronic, constipation predominant irritable bowel syndrome unresponsive to laxative therapy. On December 20, 2011, the patient underwent a normal screening colonoscopy. She has undergone a normal screening colonoscopies in 1998, 2003, 2008, and 2013.  The patient is scheduled to undergo anorectal manometry.  Chronic medications: Vytorin. Doxazosin. Premarin. Ibuprofen. Zolpidem. Nasonex. Xanax.  Past medical and surgical history: Hypercholesterolemia. Urinary bladder inertia. Chronic anxiety. Allergic rhinitis. Chondromalacia of the knee. Tibia and fibula fractures requiring surgery in 1996. Arthroscopic surgery of the left knee in 2003. D&C in 2006. Laparoscopy to treat uterine fibroid and 2008.  Medication allergies: Penicillin  Plan: Proceed with diagnostic anal rectal manometry to rule out dyssynergic defecation.

## 2013-01-04 NOTE — Telephone Encounter (Signed)
I called her. She is out of town until next week. She is due to be back in town next week on Wed. Will call on next Thursday

## 2013-01-14 NOTE — Telephone Encounter (Signed)
She did get the additional views done and she was told everything was fine. To Dr Cleta Alberts, Lorain Childes

## 2013-01-16 ENCOUNTER — Encounter: Payer: Self-pay | Admitting: Emergency Medicine

## 2013-01-30 ENCOUNTER — Encounter: Payer: Self-pay | Admitting: Emergency Medicine

## 2013-05-02 ENCOUNTER — Ambulatory Visit (INDEPENDENT_AMBULATORY_CARE_PROVIDER_SITE_OTHER): Payer: BC Managed Care – PPO | Admitting: Family Medicine

## 2013-05-02 VITALS — BP 104/70 | HR 66 | Temp 98.7°F | Resp 16 | Ht 70.0 in | Wt 143.0 lb

## 2013-05-02 DIAGNOSIS — R109 Unspecified abdominal pain: Secondary | ICD-10-CM

## 2013-05-02 DIAGNOSIS — M25559 Pain in unspecified hip: Secondary | ICD-10-CM

## 2013-05-02 DIAGNOSIS — G8929 Other chronic pain: Secondary | ICD-10-CM

## 2013-05-02 MED ORDER — TRAMADOL HCL 50 MG PO TABS: 50.0000 mg | ORAL_TABLET | Freq: Three times a day (TID) | ORAL | Status: DC | PRN

## 2013-05-02 MED ORDER — HYOSCYAMINE SULFATE 0.125 MG SL SUBL: 0.1250 mg | SUBLINGUAL_TABLET | SUBLINGUAL | Status: DC | PRN

## 2013-05-02 NOTE — Progress Notes (Signed)
Is a 62 year old woman who works for [employer] as a Diplomatic Services operational officer. She is to be a guide on a rafting service. She's going to a [employer] in [place] this weekend. She's married man who has myelofibrosis chronically.  Patient has had a long history consistent with irritable bowel syndrome. She seen Dr. Danise Edge and he is out of it he can think of to help her but eventually has referred her to gastroenterologist at [facility]. Patient had intermittent diarrhea and cramps for the last month. Selective his Xanax which helped a little. She's rather distraught because she does not want to have the diarrhea which was [employer].  Patient is alternate from diarrhea to cramps to constipation. She's taken MiraLax in the past followed by diarrhea. Most recently she took an enema and then 3 days later started having diarrhea again.  Objective: Patient is tearful and quite upset with this unpredictable nature of her abdomen  HEENT: Unremarkable Chest: Clear Heart: Regular no murmur Abdomen: Soft mildly tender diffusely, hyperactive bowel sounds, no HSM, no masses Skin: Unremarkable  Assessment: Irritable bowel syndrome, lack of self-esteem, depression. Patient denies suicidal ideation but she says she understands how a recent comedian, Brandt Loosen, could kill himself.  Plan: Recommend patient get back into counseling  Abdominal cramps - Plan: hyoscyamine (LEVSIN/SL) 0.125 MG SL tablet  Hip pain, chronic, left - Plan: traMADol (ULTRAM) 50 MG tablet  Signed, Elvina Sidle, MD

## 2013-05-22 ENCOUNTER — Emergency Department (HOSPITAL_COMMUNITY)
Admission: EM | Admit: 2013-05-22 | Discharge: 2013-05-23 | Disposition: A | Discharge status: Home / Self Care | Payer: BC Managed Care – PPO | Attending: Emergency Medicine | Admitting: Emergency Medicine

## 2013-05-22 ENCOUNTER — Emergency Department (HOSPITAL_COMMUNITY): Payer: BC Managed Care – PPO

## 2013-05-22 ENCOUNTER — Ambulatory Visit: Payer: BC Managed Care – PPO

## 2013-05-22 ENCOUNTER — Encounter (HOSPITAL_COMMUNITY): Payer: Self-pay | Admitting: Emergency Medicine

## 2013-05-22 ENCOUNTER — Ambulatory Visit (INDEPENDENT_AMBULATORY_CARE_PROVIDER_SITE_OTHER): Payer: BC Managed Care – PPO | Admitting: Family Medicine

## 2013-05-22 ENCOUNTER — Ambulatory Visit: Payer: Self-pay

## 2013-05-22 VITALS — BP 130/80 | HR 75 | Temp 97.9°F | Resp 16 | Ht 71.0 in | Wt 150.0 lb

## 2013-05-22 DIAGNOSIS — S32509A Unspecified fracture of unspecified pubis, initial encounter for closed fracture: Secondary | ICD-10-CM | POA: Insufficient documentation

## 2013-05-22 DIAGNOSIS — M25551 Pain in right hip: Secondary | ICD-10-CM

## 2013-05-22 DIAGNOSIS — M25519 Pain in unspecified shoulder: Secondary | ICD-10-CM

## 2013-05-22 DIAGNOSIS — S2239XA Fracture of one rib, unspecified side, initial encounter for closed fracture: Secondary | ICD-10-CM | POA: Insufficient documentation

## 2013-05-22 DIAGNOSIS — S2231XA Fracture of one rib, right side, initial encounter for closed fracture: Secondary | ICD-10-CM

## 2013-05-22 DIAGNOSIS — M25559 Pain in unspecified hip: Secondary | ICD-10-CM

## 2013-05-22 DIAGNOSIS — S42009A Fracture of unspecified part of unspecified clavicle, initial encounter for closed fracture: Secondary | ICD-10-CM

## 2013-05-22 DIAGNOSIS — R8281 Pyuria: Secondary | ICD-10-CM

## 2013-05-22 DIAGNOSIS — S42001A Fracture of unspecified part of right clavicle, initial encounter for closed fracture: Secondary | ICD-10-CM

## 2013-05-22 DIAGNOSIS — Y9389 Activity, other specified: Secondary | ICD-10-CM | POA: Insufficient documentation

## 2013-05-22 DIAGNOSIS — E860 Dehydration: Secondary | ICD-10-CM

## 2013-05-22 DIAGNOSIS — R82998 Other abnormal findings in urine: Secondary | ICD-10-CM

## 2013-05-22 DIAGNOSIS — R0789 Other chest pain: Secondary | ICD-10-CM

## 2013-05-22 DIAGNOSIS — S0990XA Unspecified injury of head, initial encounter: Secondary | ICD-10-CM

## 2013-05-22 DIAGNOSIS — M25511 Pain in right shoulder: Secondary | ICD-10-CM

## 2013-05-22 DIAGNOSIS — S32591A Other specified fracture of right pubis, initial encounter for closed fracture: Secondary | ICD-10-CM

## 2013-05-22 DIAGNOSIS — R079 Chest pain, unspecified: Secondary | ICD-10-CM

## 2013-05-22 DIAGNOSIS — R110 Nausea: Secondary | ICD-10-CM | POA: Insufficient documentation

## 2013-05-22 DIAGNOSIS — R0781 Pleurodynia: Secondary | ICD-10-CM

## 2013-05-22 DIAGNOSIS — Z8739 Personal history of other diseases of the musculoskeletal system and connective tissue: Secondary | ICD-10-CM | POA: Insufficient documentation

## 2013-05-22 DIAGNOSIS — Z79899 Other long term (current) drug therapy: Secondary | ICD-10-CM | POA: Insufficient documentation

## 2013-05-22 DIAGNOSIS — Y92410 Unspecified street and highway as the place of occurrence of the external cause: Secondary | ICD-10-CM | POA: Insufficient documentation

## 2013-05-22 LAB — POCT URINALYSIS DIPSTICK
Blood, UA: NEGATIVE
Nitrite, UA: NEGATIVE
Spec Grav, UA: 1.025
Urobilinogen, UA: 0.2
pH, UA: 5.5

## 2013-05-22 LAB — POCT UA - MICROSCOPIC ONLY
Casts, Ur, LPF, POC: NEGATIVE
Crystals, Ur, HPF, POC: NEGATIVE

## 2013-05-22 LAB — BASIC METABOLIC PANEL
BUN: 13 mg/dL (ref 6–23)
Calcium: 8.5 mg/dL (ref 8.4–10.5)
Chloride: 106 mEq/L (ref 96–112)
Creatinine, Ser: 0.59 mg/dL (ref 0.50–1.10)
GFR calc non Af Amer: >90 mL/min (ref >90)
Glucose, Bld: 139 mg/dL — ABNORMAL HIGH (ref 70–99)
Sodium: 141 mEq/L (ref 135–145)

## 2013-05-22 LAB — CBC
Hemoglobin: 13.3 g/dL (ref 12.0–15.0)
MCH: 31.1 pg (ref 26.0–34.0)
MCHC: 35.1 g/dL (ref 30.0–36.0)
MCV: 88.8 fL (ref 78.0–100.0)
Platelets: 132 10*3/uL — ABNORMAL LOW (ref 150–400)

## 2013-05-22 MED ORDER — FENTANYL CITRATE 0.05 MG/ML IJ SOLN
25.0000 ug | Freq: Once | INTRAMUSCULAR | Status: AC
  Administered 2013-05-23: 25 ug via INTRAVENOUS

## 2013-05-22 MED ORDER — FENTANYL CITRATE 0.05 MG/ML IJ SOLN
25.0000 ug | Freq: Once | INTRAMUSCULAR | Status: AC
  Administered 2013-05-22: 25 ug via INTRAVENOUS
  Filled 2013-05-22: qty 2

## 2013-05-22 NOTE — Patient Instructions (Signed)
If follow up needed after hospital let me know.
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
## 2013-05-23 ENCOUNTER — Telehealth: Payer: Self-pay

## 2013-05-23 DIAGNOSIS — S2241XA Multiple fractures of ribs, right side, initial encounter for closed fracture: Secondary | ICD-10-CM

## 2013-05-23 MED ORDER — DIAZEPAM 5 MG PO TABS: 5.0000 mg | ORAL_TABLET | Freq: Four times a day (QID) | ORAL | Status: DC | PRN

## 2013-05-23 MED ORDER — IBUPROFEN 800 MG PO TABS: 800.0000 mg | ORAL_TABLET | Freq: Three times a day (TID) | ORAL | Status: DC

## 2013-05-23 MED ORDER — ONDANSETRON 8 MG PO TBDP: ORAL_TABLET | ORAL | Status: DC

## 2013-05-23 MED ORDER — ONDANSETRON HCL 4 MG/2ML IJ SOLN
4.0000 mg | Freq: Once | INTRAMUSCULAR | Status: AC
  Administered 2013-05-23: 4 mg via INTRAVENOUS
  Filled 2013-05-23: qty 2

## 2013-05-23 MED ORDER — OXYCODONE-ACETAMINOPHEN 5-325 MG PO TABS: 1.0000 | ORAL_TABLET | ORAL | Status: DC | PRN

## 2013-05-23 MED ORDER — OXYCODONE-ACETAMINOPHEN 5-325 MG PO TABS
2.0000 | ORAL_TABLET | Freq: Once | ORAL | Status: AC
  Administered 2013-05-23: 2 via ORAL
  Filled 2013-05-23: qty 2

## 2013-05-23 MED ORDER — MORPHINE SULFATE 4 MG/ML IJ SOLN
4.0000 mg | Freq: Once | INTRAMUSCULAR | Status: AC
  Administered 2013-05-23: 4 mg via INTRAVENOUS
  Filled 2013-05-23: qty 1

## 2013-05-23 MED ORDER — HYDROCODONE-ACETAMINOPHEN 5-325 MG PO TABS: 1.0000 | ORAL_TABLET | Freq: Four times a day (QID) | ORAL | Status: DC | PRN

## 2013-05-23 MED ORDER — METHOCARBAMOL 500 MG PO TABS: 500.0000 mg | ORAL_TABLET | Freq: Two times a day (BID) | ORAL | Status: DC
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
## 2013-05-23 NOTE — Telephone Encounter (Signed)
Advised her to discontinue this medication. Please advise if you can give her something else instead. I did advise since she has some tramadol on hand for her to try this now, and I will call back to let him know what else we can give her instead. Please advise.
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
## 2013-05-23 NOTE — Telephone Encounter (Signed)
Patient's husband states that when his wife took oxycodone this morning she vomited. Wants to know if he should continue to administer this medication to her, stop it all together, or if something else can be prescribed.  [phone]

## 2013-05-23 NOTE — Telephone Encounter (Signed)
Discontinue percocet.  Can use tramadol instead.  If not getting enough pain relief, would ask Dr. Neva Seat to advise on hydrocodone rx - though pt may have difficulty tolerating this as well

## 2013-05-23 NOTE — Telephone Encounter (Signed)
Can increase ultram up to 100mg  up to every 6 hours if needed, but if still no relief, I will Rx Lortab 5/325, and can take 1/2 to 1 of these up to every 6 hours, but this may also cause vomiting.  If intolerant to this, may need to try zofran to help with nausea, but would start with either ultram as above, or LOW dose hydrocodone. Also recommend discussing pain meds with ortho at her visit tomorrow.  Let me know if any other questions.
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
## 2013-05-24 ENCOUNTER — Encounter (HOSPITAL_BASED_OUTPATIENT_CLINIC_OR_DEPARTMENT_OTHER): Payer: Self-pay | Admitting: *Deleted

## 2013-05-24 LAB — URINE CULTURE: Colony Count: NO GROWTH
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
## 2013-05-28 ENCOUNTER — Ambulatory Visit (HOSPITAL_BASED_OUTPATIENT_CLINIC_OR_DEPARTMENT_OTHER)
Admission: RE | Admit: 2013-05-28 | Discharge: 2013-05-28 | Disposition: A | Discharge status: Home / Self Care | Payer: BC Managed Care – PPO | Source: Ambulatory Visit | Attending: Orthopedic Surgery | Admitting: Orthopedic Surgery

## 2013-05-28 ENCOUNTER — Encounter (HOSPITAL_BASED_OUTPATIENT_CLINIC_OR_DEPARTMENT_OTHER): Payer: Self-pay | Admitting: Anesthesiology

## 2013-05-28 ENCOUNTER — Encounter (HOSPITAL_BASED_OUTPATIENT_CLINIC_OR_DEPARTMENT_OTHER)
Admission: RE | Disposition: A | Discharge status: Home / Self Care | Payer: Self-pay | Source: Ambulatory Visit | Attending: Orthopedic Surgery

## 2013-05-28 ENCOUNTER — Ambulatory Visit (HOSPITAL_BASED_OUTPATIENT_CLINIC_OR_DEPARTMENT_OTHER): Payer: BC Managed Care – PPO | Admitting: Anesthesiology

## 2013-05-28 DIAGNOSIS — S32509A Unspecified fracture of unspecified pubis, initial encounter for closed fracture: Secondary | ICD-10-CM | POA: Insufficient documentation

## 2013-05-28 DIAGNOSIS — S42023A Displaced fracture of shaft of unspecified clavicle, initial encounter for closed fracture: Secondary | ICD-10-CM | POA: Insufficient documentation

## 2013-05-28 DIAGNOSIS — S2239XA Fracture of one rib, unspecified side, initial encounter for closed fracture: Secondary | ICD-10-CM | POA: Insufficient documentation

## 2013-05-28 HISTORY — DX: Irritable bowel syndrome, unspecified: K58.9

## 2013-05-28 HISTORY — DX: Other specified postprocedural states: Z98.890

## 2013-05-28 HISTORY — PX: ORIF CLAVICULAR FRACTURE: SHX5055

## 2013-05-28 HISTORY — DX: Other specified postprocedural states: R11.2

## 2013-05-28 HISTORY — DX: Other constipation: K59.09

## 2013-05-28 SURGERY — OPEN REDUCTION INTERNAL FIXATION (ORIF) CLAVICULAR FRACTURE
Anesthesia: General | Site: Shoulder | Laterality: Right | Wound class: Clean

## 2013-05-28 MED ORDER — DIAZEPAM 5 MG PO TABS: 5.0000 mg | ORAL_TABLET | Freq: Four times a day (QID) | ORAL | Status: DC | PRN

## 2013-05-28 MED ORDER — SCOPOLAMINE 1 MG/3DAYS TD PT72
1.0000 | MEDICATED_PATCH | Freq: Once | TRANSDERMAL | Status: DC
  Administered 2013-05-28: 1.5 mg via TRANSDERMAL

## 2013-05-28 MED ORDER — OXYCODONE HCL 5 MG/5ML PO SOLN: 5.0000 mg | Freq: Once | ORAL | Status: DC | PRN

## 2013-05-28 MED ORDER — SUCCINYLCHOLINE CHLORIDE 20 MG/ML IJ SOLN
INTRAMUSCULAR | Status: DC | PRN
  Administered 2013-05-28: 100 mg via INTRAVENOUS

## 2013-05-28 MED ORDER — MIDAZOLAM HCL 5 MG/5ML IJ SOLN
INTRAMUSCULAR | Status: DC | PRN
  Administered 2013-05-28 (×2): 1 mg via INTRAVENOUS

## 2013-05-28 MED ORDER — FENTANYL CITRATE 0.05 MG/ML IJ SOLN
INTRAMUSCULAR | Status: DC | PRN
  Administered 2013-05-28: 50 ug via INTRAVENOUS
  Administered 2013-05-28: 25 ug via INTRAVENOUS
  Administered 2013-05-28: 50 ug via INTRAVENOUS
  Administered 2013-05-28 (×3): 25 ug via INTRAVENOUS

## 2013-05-28 MED ORDER — LIDOCAINE HCL (CARDIAC) 20 MG/ML IV SOLN
INTRAVENOUS | Status: DC | PRN
  Administered 2013-05-28: 100 mg via INTRAVENOUS

## 2013-05-28 MED ORDER — MIDAZOLAM HCL 2 MG/2ML IJ SOLN: 1.0000 mg | INTRAMUSCULAR | Status: DC | PRN

## 2013-05-28 MED ORDER — DEXTROSE-NACL 5-0.45 % IV SOLN: 100.0000 mL/h | INTRAVENOUS | Status: DC

## 2013-05-28 MED ORDER — METOCLOPRAMIDE HCL 5 MG/ML IJ SOLN
INTRAMUSCULAR | Status: DC | PRN
  Administered 2013-05-28: 10 mg via INTRAVENOUS

## 2013-05-28 MED ORDER — LACTATED RINGERS IV SOLN
INTRAVENOUS | Status: DC
  Administered 2013-05-28: 12:00:00 via INTRAVENOUS
  Administered 2013-05-28: 10 mL/h via INTRAVENOUS
  Administered 2013-05-28: 14:00:00 via INTRAVENOUS

## 2013-05-28 MED ORDER — DEXAMETHASONE SODIUM PHOSPHATE 4 MG/ML IJ SOLN
INTRAMUSCULAR | Status: DC | PRN
  Administered 2013-05-28: 10 mg via INTRAVENOUS

## 2013-05-28 MED ORDER — FENTANYL CITRATE 0.05 MG/ML IJ SOLN: 50.0000 ug | INTRAMUSCULAR | Status: DC | PRN

## 2013-05-28 MED ORDER — DEXTROSE 5 % IV SOLN
3.0000 g | INTRAVENOUS | Status: AC
  Administered 2013-05-28: 2 g via INTRAVENOUS

## 2013-05-28 MED ORDER — HYDROMORPHONE HCL PF 1 MG/ML IJ SOLN
0.2500 mg | INTRAMUSCULAR | Status: DC | PRN
  Administered 2013-05-28 (×3): 0.5 mg via INTRAVENOUS

## 2013-05-28 MED ORDER — OXYCODONE-ACETAMINOPHEN 5-325 MG PO TABS: 1.0000 | ORAL_TABLET | ORAL | Status: DC | PRN

## 2013-05-28 MED ORDER — ONDANSETRON HCL 4 MG/2ML IJ SOLN
INTRAMUSCULAR | Status: DC | PRN
  Administered 2013-05-28: 4 mg via INTRAMUSCULAR

## 2013-05-28 MED ORDER — CHLORHEXIDINE GLUCONATE 4 % EX LIQD: 60.0000 mL | Freq: Once | CUTANEOUS | Status: DC

## 2013-05-28 MED ORDER — ACETAMINOPHEN 500 MG PO TABS
1000.0000 mg | ORAL_TABLET | Freq: Once | ORAL | Status: AC
  Administered 2013-05-28: 1000 mg via ORAL

## 2013-05-28 MED ORDER — EPHEDRINE SULFATE 50 MG/ML IJ SOLN
INTRAMUSCULAR | Status: DC | PRN
  Administered 2013-05-28 (×2): 10 mg via INTRAVENOUS

## 2013-05-28 MED ORDER — METOCLOPRAMIDE HCL 5 MG/ML IJ SOLN: 10.0000 mg | Freq: Once | INTRAMUSCULAR | Status: DC | PRN

## 2013-05-28 MED ORDER — BUPIVACAINE HCL (PF) 0.5 % IJ SOLN
INTRAMUSCULAR | Status: DC | PRN
  Administered 2013-05-28: 10 mL

## 2013-05-28 MED ORDER — OXYCODONE HCL 5 MG PO TABS: 5.0000 mg | ORAL_TABLET | Freq: Once | ORAL | Status: DC | PRN

## 2013-05-28 MED ORDER — PROPOFOL 10 MG/ML IV BOLUS
INTRAVENOUS | Status: DC | PRN
  Administered 2013-05-28: 200 mg via INTRAVENOUS

## 2013-05-28 SURGICAL SUPPLY — 70 items
APL SKNCLS STERI-STRIP NONHPOA (GAUZE/BANDAGES/DRESSINGS) ×1
BENZOIN TINCTURE PRP APPL 2/3 (GAUZE/BANDAGES/DRESSINGS) ×1 IMPLANT
BIT DRILL 2.0 LNG QUCK RELEASE (BIT) IMPLANT
BIT DRILL 2.8X5 QR DISP (BIT) ×1 IMPLANT
BLADE SURG 15 STRL LF DISP TIS (BLADE) ×1 IMPLANT
BLADE SURG 15 STRL SS (BLADE) ×4
BLADE SURG ROTATE 9660 (MISCELLANEOUS) IMPLANT
CHLORAPREP W/TINT 26ML (MISCELLANEOUS) ×2 IMPLANT
CLOTH BEACON ORANGE TIMEOUT ST (SAFETY) ×2 IMPLANT
DECANTER SPIKE VIAL GLASS SM (MISCELLANEOUS) IMPLANT
DRAPE OEC MINIVIEW 54X84 (DRAPES) ×1 IMPLANT
DRAPE U 20/CS (DRAPES) ×1 IMPLANT
DRAPE U-SHAPE 47X51 STRL (DRAPES) ×2 IMPLANT
DRAPE U-SHAPE 76X120 STRL (DRAPES) ×4 IMPLANT
DRILL 2.0 LNG QUICK RELEASE (BIT) ×2
DRSG EMULSION OIL 3X3 NADH (GAUZE/BANDAGES/DRESSINGS) ×1 IMPLANT
ELECT REM PT RETURN 9FT ADLT (ELECTROSURGICAL) ×2
ELECTRODE REM PT RTRN 9FT ADLT (ELECTROSURGICAL) ×1 IMPLANT
GAUZE XEROFORM 1X8 LF (GAUZE/BANDAGES/DRESSINGS) ×1 IMPLANT
GLOVE BIO SURGEON STRL SZ7.5 (GLOVE) ×2 IMPLANT
GLOVE BIO SURGEON STRL SZ8 (GLOVE) ×2 IMPLANT
GLOVE BIOGEL PI IND STRL 7.0 (GLOVE) IMPLANT
GLOVE BIOGEL PI IND STRL 8 (GLOVE) ×1 IMPLANT
GLOVE BIOGEL PI INDICATOR 7.0 (GLOVE) ×1
GLOVE BIOGEL PI INDICATOR 8 (GLOVE) ×2
GLOVE ECLIPSE 6.5 STRL STRAW (GLOVE) ×1 IMPLANT
GOWN PREVENTION PLUS XLARGE (GOWN DISPOSABLE) ×5 IMPLANT
GOWN PREVENTION PLUS XXLARGE (GOWN DISPOSABLE) ×1 IMPLANT
GUIDEWIRE ORTHO .059X5 (WIRE) ×1 IMPLANT
NS IRRIG 1000ML POUR BTL (IV SOLUTION) ×2 IMPLANT
PACK ARTHROSCOPY DSU (CUSTOM PROCEDURE TRAY) ×2 IMPLANT
PACK BASIN DAY SURGERY FS (CUSTOM PROCEDURE TRAY) ×2 IMPLANT
PENCIL BUTTON HOLSTER BLD 10FT (ELECTRODE) ×2 IMPLANT
PLATE RIGHT DISTAL CLAVICLE (Plate) ×1 IMPLANT
SCREW 2.3X12MM (Screw) ×1 IMPLANT
SCREW BN FT 16X2.3XLCK HEX CRT (Screw) IMPLANT
SCREW CORTICAL 2.3X14 (Screw) ×1 IMPLANT
SCREW CORTICAL LOCKING 2.3X16M (Screw) ×4 IMPLANT
SCREW HEXALOBE NON-LOCK 3.5X14 (Screw) ×1 IMPLANT
SCREW LOCK FT 8X2.3X HEXNS (Screw) IMPLANT
SCREW LOCKING 2.3X8MM (Screw) ×2 IMPLANT
SCREW NON LOCKING 2.3X12MM (Screw) ×1 IMPLANT
SCREW NON TOGG 2.3X18MM (Screw) ×1 IMPLANT
SCREW NONLOCK HEX 3.5X12 (Screw) ×3 IMPLANT
SLEEVE SCD COMPRESS KNEE MED (MISCELLANEOUS) ×1 IMPLANT
SLING ARM FOAM STRAP LRG (SOFTGOODS) IMPLANT
SLING ARM FOAM STRAP MED (SOFTGOODS) ×1 IMPLANT
SLING ARM FOAM STRAP XLG (SOFTGOODS) IMPLANT
SLING ARM IMMOBILIZER LRG (SOFTGOODS) IMPLANT
SLING ARM IMMOBILIZER MED (SOFTGOODS) IMPLANT
SPONGE GAUZE 4X4 12PLY (GAUZE/BANDAGES/DRESSINGS) ×2 IMPLANT
SPONGE LAP 18X18 X RAY DECT (DISPOSABLE) ×3 IMPLANT
STAPLER VISISTAT 35W (STAPLE) IMPLANT
STRIP CLOSURE SKIN 1/2X4 (GAUZE/BANDAGES/DRESSINGS) ×1 IMPLANT
SUCTION FRAZIER TIP 10 FR DISP (SUCTIONS) ×1 IMPLANT
SUT ETHILON 3 0 PS 1 (SUTURE) IMPLANT
SUT FIBERWIRE #2 38 T-5 BLUE (SUTURE)
SUT MNCRL AB 4-0 PS2 18 (SUTURE) ×1 IMPLANT
SUT RETRIEVER MED (INSTRUMENTS) IMPLANT
SUT VIC AB 0 CT1 27 (SUTURE)
SUT VIC AB 0 CT1 27XBRD ANBCTR (SUTURE) IMPLANT
SUT VIC AB 0 SH 27 (SUTURE) ×1 IMPLANT
SUT VIC AB 2-0 SH 27 (SUTURE)
SUT VIC AB 2-0 SH 27XBRD (SUTURE) IMPLANT
SUT VIC AB 3-0 SH 27 (SUTURE) ×2
SUT VIC AB 3-0 SH 27X BRD (SUTURE) IMPLANT
SUTURE FIBERWR #2 38 T-5 BLUE (SUTURE) ×3 IMPLANT
SYR BULB 3OZ (MISCELLANEOUS) ×2 IMPLANT
TOWEL OR 17X24 6PK STRL BLUE (TOWEL DISPOSABLE) ×3 IMPLANT
YANKAUER SUCT BULB TIP NO VENT (SUCTIONS) ×2 IMPLANT

## 2013-05-28 NOTE — Anesthesia Procedure Notes (Addendum)
Procedure Name: Intubation Date/Time: 05/28/2013 1:29 PM Performed by: Caren Macadam Pre-anesthesia Checklist: Patient identified, Emergency Drugs available, Suction available and Patient being monitored Patient Re-evaluated:Patient Re-evaluated prior to inductionOxygen Delivery Method: Circle System Utilized Preoxygenation: Pre-oxygenation with 100% oxygen Intubation Type: IV induction Ventilation: Mask ventilation without difficulty Laryngoscope Size: Miller and 2 Tube type: Oral Tube size: 7.0 mm Number of attempts: 1 Airway Equipment and Method: stylet and oral airway Placement Confirmation: ETT inserted through vocal cords under direct vision,  positive ETCO2 and breath sounds checked- equal and bilateral Secured at: 21 cm Tube secured with: Tape Dental Injury: Teeth and Oropharynx as per pre-operative assessment

## 2013-05-28 NOTE — Anesthesia Postprocedure Evaluation (Signed)
Anesthesia Post Note  Patient: Erika Johns  Procedure(s) Performed: Procedure(s) (LRB): OPEN REDUCTION INTERNAL FIXATION (ORIF) CLAVICULAR FRACTURE (Right)  Anesthesia type: General  Patient location: PACU  Post pain: Pain level controlled  Post assessment: Patient's Cardiovascular Status Stable  Last Vitals:  Filed Vitals:   05/28/13 1600  BP: 129/60  Pulse: 85  Temp:   Resp: 12    Post vital signs: Reviewed and stable  Level of consciousness: alert  Complications: No apparent anesthesia complications
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
## 2013-05-28 NOTE — Transfer of Care (Signed)
Immediate Anesthesia Transfer of Care Note  Patient: Erika Johns  Procedure(s) Performed: Procedure(s): OPEN REDUCTION INTERNAL FIXATION (ORIF) CLAVICULAR FRACTURE (Right)  Patient Location: PACU  Anesthesia Type:General  Level of Consciousness: awake and alert   Airway & Oxygen Therapy: Patient Spontanous Breathing and Patient connected to face mask oxygen  Post-op Assessment: Report given to PACU RN and Post -op Vital signs reviewed and stable  Post vital signs: Reviewed and stable  Complications: No apparent anesthesia complications

## 2013-05-28 NOTE — Op Note (Signed)
05/28/2013  2:44 PM  PATIENT:  Erika Johns    PRE-OPERATIVE DIAGNOSIS:  RIGHT CLAVICLE FRACTURE SHAFT CLOSED 810.02  POST-OPERATIVE DIAGNOSIS:  Same  PROCEDURE:  OPEN REDUCTION INTERNAL FIXATION (ORIF) CLAVICULAR FRACTURE  SURGEON:  Sheral Apley, MD  ASSISTANT: Janace Litten, OPA  ANESTHESIA:   Gen  PREOPERATIVE INDICATIONS:  Erika Johns is a  62 y.o. female with a diagnosis of RIGHT CLAVICLE FRACTURE SHAFT CLOSED 810.02 who failed conservative measures and elected for surgical management.    The risks benefits and alternatives were discussed with the patient preoperatively including but not limited to the risks of infection, bleeding, nerve injury, cardiopulmonary complications, the need for revision surgery, among others, and the patient was willing to proceed.  OPERATIVE IMPLANTS: Acumed clavicle plate  OPERATIVE FINDINGS: Stable fixation  BLOOD LOSS: 20  COMPLICATIONS: None  TOURNIQUET TIME: None  OPERATIVE PROCEDURE:  Patient was identified in the preoperative holding area and site was marked by me He was transported to the operating theater and placed on the table in supine position taking care to pad all bony prominences. After a preincinduction time out anesthesia was induced. The Right upper extremity was prepped and draped in normal sterile fashion and a pre-incision timeout was performed. Erika Johns received Ancef for preoperative antibiotics.   I made a 5 cm incision centered over the fracture the clavicle. Dissection was carried down sharply to the clavipectoral fascia which was incised over the superior aspect of the clavicle. I then elevated the periosteum off of the superior edge of the clavicle and identified the fracture. I curetted out the fracture and there is a small amount of comminution of those too small to be fixed in I performed a reduction maneuver selected a short superior lateral Acumed clavicle plate. I  fixed it to the proximal bone disease using lag screws. I then used a combination of locking and nonlocking screws 28 in the reduction of the distal piece up to the plate as well as a clamp. I then placed the locking screws one third and reduce of the plate. I did take x-rays at this time and was happy with the position of the plate. There was a small gap between the superior border of the distal fracture fragment and the clavicle plate I felt that this was due to the edge of the joint capsule and a small amount of soft tissue between the plate and the bone. I was however very happy with our reduction and fixation was very stable. I did back out the locking screws and advanced the lag screw somewhat to reduce this further which I accomplished. I then felt that the remaining gap was more than acceptable and reengaged locking screws. I placed another locking screw generated stabilization. I then replaced one of the proximal screws were appropriate length. The wound was then thoroughly irrigated and closed in layers closing the closing the clavipectoral fascia over top of the plate and then the skin with absorbable suture. Sterile dressing was applied she was placed in a sling and taken the PACU in stable condition for recovery  POST OPERATIVE PLAN: She'll be nonweightbearing of his right upper extremity she'll take an aspirin once a day for 30 days for DVT prophylaxis.

## 2013-05-28 NOTE — Anesthesia Preprocedure Evaluation (Signed)
Anesthesia Evaluation  Patient identified by MRN, date of birth, ID band Patient awake    Reviewed: Allergy & Precautions, H&P , NPO status , Patient's Chart, lab work & pertinent test results, reviewed documented beta blocker date and time   History of Anesthesia Complications (+) PONV  Airway Mallampati: II TM Distance: >3 FB Neck ROM: full    Dental   Pulmonary neg pulmonary ROS,  breath sounds clear to auscultation        Cardiovascular negative cardio ROS  Rhythm:regular     Neuro/Psych  Neuromuscular disease negative psych ROS   GI/Hepatic negative GI ROS, Neg liver ROS,   Endo/Other  negative endocrine ROS  Renal/GU negative Renal ROS  negative genitourinary   Musculoskeletal   Abdominal   Peds  Hematology negative hematology ROS (+)   Anesthesia Other Findings See surgeon's H&P   Reproductive/Obstetrics negative OB ROS                           Anesthesia Physical Anesthesia Plan  ASA: II  Anesthesia Plan: General   Post-op Pain Management:    Induction: Intravenous  Airway Management Planned: Oral ETT  Additional Equipment:   Intra-op Plan:   Post-operative Plan: Extubation in OR  Informed Consent: I have reviewed the patients History and Physical, chart, labs and discussed the procedure including the risks, benefits and alternatives for the proposed anesthesia with the patient or authorized representative who has indicated his/her understanding and acceptance.   Dental Advisory Given  Plan Discussed with: CRNA and Surgeon  Anesthesia Plan Comments:         Anesthesia Quick Evaluation

## 2013-05-29 ENCOUNTER — Encounter (HOSPITAL_BASED_OUTPATIENT_CLINIC_OR_DEPARTMENT_OTHER): Payer: Self-pay | Admitting: Orthopedic Surgery

## 2013-06-27 ENCOUNTER — Other Ambulatory Visit: Payer: Self-pay

## 2013-08-26 ENCOUNTER — Other Ambulatory Visit: Payer: Self-pay | Admitting: Gastroenterology

## 2013-08-26 DIAGNOSIS — R109 Unspecified abdominal pain: Secondary | ICD-10-CM

## 2013-09-04 ENCOUNTER — Ambulatory Visit
Admission: RE | Admit: 2013-09-04 | Discharge: 2013-09-04 | Disposition: A | Discharge status: Home / Self Care | Payer: BC Managed Care – PPO | Source: Ambulatory Visit | Attending: Gastroenterology | Admitting: Gastroenterology

## 2013-09-04 DIAGNOSIS — R109 Unspecified abdominal pain: Secondary | ICD-10-CM

## 2013-10-22 ENCOUNTER — Ambulatory Visit (INDEPENDENT_AMBULATORY_CARE_PROVIDER_SITE_OTHER): Payer: BC Managed Care – PPO | Admitting: Family Medicine

## 2013-10-22 VITALS — BP 112/72 | HR 75 | Temp 98.6°F | Resp 18 | Ht 70.5 in | Wt 143.0 lb

## 2013-10-22 DIAGNOSIS — G4700 Insomnia, unspecified: Secondary | ICD-10-CM

## 2013-10-22 DIAGNOSIS — R6889 Other general symptoms and signs: Secondary | ICD-10-CM

## 2013-10-22 DIAGNOSIS — F418 Other specified anxiety disorders: Secondary | ICD-10-CM

## 2013-10-22 DIAGNOSIS — F341 Dysthymic disorder: Secondary | ICD-10-CM

## 2013-10-22 MED ORDER — CLONAZEPAM 0.5 MG PO TABS: 0.2500 mg | ORAL_TABLET | Freq: Two times a day (BID) | ORAL | Status: DC

## 2013-10-22 MED ORDER — SERTRALINE HCL 50 MG PO TABS: 50.0000 mg | ORAL_TABLET | Freq: Every day | ORAL | Status: DC

## 2013-10-22 NOTE — Progress Notes (Signed)
Subjective:    Patient ID: Erika Johns, female    DOB: Mar 17, 1951, 64 y.o.   MRN: [id number], Erika Sayre, MD  HPI Erika Johns is a 63 y.o. female  Here for follow up.  Saw Vivia Budge today - counselor for some time for some depression/anxiety sx's. Had not seen him since last year, Counseling only. Now planning on once per week.  Did have Rx for Xanax as needed in Dec 23, 2011. Rarely used - only when severely anxious. . Diagnosed with anxiety at visit with Marjory Lies today. Anxious. Husband with chronic illness, changes at job, change in [place], now with mgr that gives feedback. Bottled up anger an resentment. Husband had her fill out depression scale after a "meltdown" last week. Score of 17-25 on BDI - II. Denies Suicidal intent.  Hates job, worthless feeling. + depressed mood. Improves with exercise - not being able to do this is challenging.  Denies anhedonia.  Always cold.  Lab Results  Component Value Date   TSH 2.256 12/07/2012   Depression in 2008-12-22 when father died, did not like paxil.  [facility]. Tried Remeron, then Elavil which helped some.  Ambien every night for insomnia, some awakening at 2:30pm.   Hx of upset stomach and GI issues, followed by gastroenterologist with mutiple evaluations. Usually notes when at work. Possible IBS. Has appt scheduled in [place].   No hx of mania, no FH of bipolar d/o.   Patient Active Problem List   Diagnosis Date Noted  . KNEE PAIN 08/27/2009  . LUMBAGO 08/27/2009  . 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 HAND PAIN, LEFT 08/27/2009   Past Medical History  Diagnosis Date  . Allergy   . Arthritis   . Insomnia   . PONV (postoperative nausea and vomiting)     severe n/v-needs scop  . Constipation, chronic   . IBS (irritable bowel syndrome)    Past Surgical History  Procedure Laterality Date  . Knee arthroscopy  Dec 22, 2001    lt  . Knee arthroscopy      right  . Fracture surgery  1996    fx  tib-fib-rt  . Hardware removal  1996    rt ankle  . Dilation and curettage of uterus  Dec 22, 2004  . Tonsillectomy    . Colonoscopy    . Orif clavicular fracture Right 05/28/2013    Procedure: OPEN REDUCTION INTERNAL FIXATION (ORIF) CLAVICULAR FRACTURE;  Surgeon: Renette Butters, MD;  Location: [facility];  Service: Orthopedics;  Laterality: Right;   Allergies  Allergen Reactions  . Effexor [Venlafaxine Hydrochloride] Other (See Comments)    dizzy  . Hydrocodone Nausea And Vomiting  . Ampicillin Rash   Prior to Admission medications   Medication Sig Start Date End Date Taking? Authorizing Provider  Ascorbic Acid (VITAMIN C) 1000 MG tablet Take 1,000 mg by mouth daily.   Yes Historical Provider, MD  atorvastatin (LIPITOR) 40 MG tablet Take 40 mg by mouth daily.   Yes Historical Provider, MD  Cholecalciferol (VITAMIN D3) 1000 UNITS CAPS Take by mouth daily.   Yes Historical Provider, MD  diazepam (VALIUM) 5 MG tablet Take 1 tablet (5 mg total) by mouth every 6 (six) hours as needed for anxiety (spasms). 05/28/13  Yes Renette Butters, MD  ezetimibe-simvastatin (VYTORIN) 10-20 MG per tablet Take 1 tablet by mouth every other day. 12/27/11  Yes Darlyne Russian, MD  HYDROcodone-acetaminophen (NORCO/VICODIN) 5-325 MG per tablet Take 1 tablet by mouth every  6 (six) hours as needed for moderate pain.   Yes Historical Provider, MD  hyoscyamine (LEVSIN/SL) 0.125 MG SL tablet Place 1 tablet (0.125 mg total) under the tongue every 4 (four) hours as needed for cramping. 05/02/13  Yes Robyn Haber, MD  ibuprofen (ADVIL,MOTRIN) 800 MG tablet Take 1 tablet (800 mg total) by mouth 3 (three) times daily. 05/23/13  Yes Hannah Muthersbaugh, PA-C  meloxicam (MOBIC) 7.5 MG tablet Take 7.5 mg by mouth daily.   Yes Historical Provider, MD  methocarbamol (ROBAXIN) 500 MG tablet Take 1 tablet (500 mg total) by mouth 2 (two) times daily. 05/23/13  Yes Hannah Muthersbaugh, PA-C  polyethylene glycol (MIRALAX /  GLYCOLAX) packet Take 17 g by mouth daily.   Yes Historical Provider, MD  traMADol (ULTRAM) 50 MG tablet Take 1 tablet (50 mg total) by mouth every 8 (eight) hours as needed for pain. 05/02/13  Yes Robyn Haber, MD  valACYclovir (VALTREX) 1000 MG tablet Take 2 tabs twice a day for 2 doses for flareups of Cold sores 12/07/12  Yes Darlyne Russian, MD  zolpidem (AMBIEN) 10 MG tablet Take 1 tablet (10 mg total) by mouth at bedtime as needed. 10/31/12  Yes Thao P Le, DO  ondansetron (ZOFRAN ODT) 8 MG disintegrating tablet 8mg  ODT q4 hours prn nausea 05/23/13   Jarrett Soho Muthersbaugh, PA-C  oxyCODONE-acetaminophen (PERCOCET/ROXICET) 5-325 MG per tablet Take 1 tablet by mouth every 4 (four) hours as needed for pain. 05/28/13   Renette Butters, MD   History   Social History  . Marital Status: Married    Spouse Name: N/A    Number of Children: N/A  . Years of Education: N/A   Occupational History  . Not on file.   Social History Main Topics  . Smoking status: Never Smoker   . Smokeless tobacco: Not on file  . Alcohol Use: Yes  . Drug Use: No  . Sexual Activity: Yes   Other Topics Concern  . Not on file   Social History Narrative  . No narrative on file    Review of Systems As above.      Objective:   Physical Exam  Vitals reviewed. Constitutional: She is oriented to person, place, and time. She appears well-developed and well-nourished. No distress.  HENT:  Head: Normocephalic and atraumatic.  Neck: Normal range of motion. No thyromegaly present.  Pulmonary/Chest: Effort normal and breath sounds normal.  Neurological: She is alert and oriented to person, place, and time.  Skin: Skin is warm and dry. No rash noted.  Psychiatric: Her speech is normal and behavior is normal. Her mood appears anxious. She exhibits a depressed mood. She expresses no suicidal ideation.  Tearful at times during ov.  Good eye contact, affect mood congruent.    Filed Vitals:   10/22/13 1111  BP: 112/72    Pulse: 75  Temp: 98.6 F (37 C)  TempSrc: Oral  Resp: 18  Height: 5' 10.5" (1.791 m)  Weight: 143 lb (64.864 kg)  SpO2: 97%       Assessment & Plan:   Erika Johns is a 63 y.o. 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 Start Zoloft, change prior xanax to klonopin BID prn, and advised against combo of this and valium or  ambien. Will plan on looking at other insomnia options in follow up. Recheck in next few weeks.RTC precautions and SI precautions and contract for safety plan discussed if these occur. Check TSH with cold natured and mood sx's.   Meds ordered this encounter                         . sertraline (ZOLOFT) 50 MG tablet    Sig: Take 1 tablet (50 mg total) by mouth daily.    Dispense:  30 tablet    Refill:  3  . clonazePAM (KLONOPIN) 0.5 MG tablet    Sig: Take 0.5-1 tablets (0.25-0.5 mg total) by mouth 2 (two) times daily.    Dispense:  20 tablet    Refill:  1   Patient Instructions  Klonopin up to 2 times per day for anxiety, but do not combine with ambien OR valium. Start Zoloft once per day, and keep follow up with Vivia Budge. Recheck with me in next 2 weeks.  Return to the clinic or go to the nearest emergency room if any of your symptoms worsen or new symptoms occur. You should receive a call or letter about your lab results within the next week to 10 days.

## 2013-10-22 NOTE — Patient Instructions (Addendum)
Klonopin up to 2 times per day for anxiety, but do not combine with ambien OR valium. Start Zoloft once per day, and keep follow up with Vivia Budge. Recheck with me in next 2 weeks.  Return to the clinic or go to the nearest emergency room if any of your symptoms worsen or new symptoms occur. You should receive a call or letter about your lab results within the next week to 10 days.

## 2013-10-23 LAB — TSH: TSH: 2.046 u[IU]/mL (ref 0.350–4.500)

## 2013-11-05 ENCOUNTER — Ambulatory Visit (INDEPENDENT_AMBULATORY_CARE_PROVIDER_SITE_OTHER): Payer: BC Managed Care – PPO | Admitting: Family Medicine

## 2013-11-05 VITALS — BP 120/74 | HR 76 | Temp 98.9°F | Resp 16 | Ht 70.5 in | Wt 145.0 lb

## 2013-11-05 DIAGNOSIS — F418 Other specified anxiety disorders: Secondary | ICD-10-CM

## 2013-11-05 DIAGNOSIS — F341 Dysthymic disorder: Secondary | ICD-10-CM

## 2013-11-05 DIAGNOSIS — K5900 Constipation, unspecified: Secondary | ICD-10-CM

## 2013-11-05 DIAGNOSIS — G4700 Insomnia, unspecified: Secondary | ICD-10-CM

## 2013-11-05 MED ORDER — SERTRALINE HCL 100 MG PO TABS: 100.0000 mg | ORAL_TABLET | Freq: Every day | ORAL | Status: DC

## 2013-11-05 NOTE — Progress Notes (Signed)
Subjective:    Patient ID: Erika Johns, female    DOB: 02-18-1951, 63 y.o.   MRN: [id number]  This chart was scribed for Erika Agreste, MD by Maree Erie, ED Scribe.  Chief Complaint  Patient presents with  . Follow-up    to discuss medication     PCP: Jenny Reichmann, MD   HPI  [name] is a 63 y.o. female who presents to office for a follow up. She was last seen two weeks ago and diagnosed with depression/anxiety. Started Zoloft, 50 mg a day. Changed Xanax to Klonopin. She had taken Valium and Ambien in the past and was cautioned against combo of these medications. TSH was normal.   Depression/anxiety: She states that she saw Vivia Budge, her counselor, yesterday and states she originally felt "flat" but is feeling better while taking her Zoloft. She reports increased thirst but denies any other side effects. She has been taking Klonopin and originally taking it only on days that she worked but is now taking it every day. She takes a half tablet twice a day. She takes the Klonopin at 9 am and then at 10 pm before going to bed. She states that she has been unable to sleep at night, which she attributes to anxiety about the upcoming day. She has taken a half tablet Ambien once since last being seen. She denies taking any Valium since last being seen. She was told by her counselor that she may need to increase her dosage of Zoloft. She reports lingering defeatist thoughts. She describes it as a feeling of worthlessness and being sick of her job. She also recently got a bad review from a new manager at the job which may be contributing to these feelings. She has not been exercising recently but wants to start using the aquatic center. She has been trying to use her bike again and is starting to do yoga, which she states makes her feel less tight in the mornings. She denies any suicidal ideations or plans for suicide.  Constipation: She is also complaining of  constipation. She has been taking Miralax everyday for the issue and states that she is "extruding" her BMs, possibly due to the medication.   Patient Active Problem List   Diagnosis Date Noted  . KNEE PAIN 08/27/2009  . LUMBAGO 08/27/2009  . 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 HAND PAIN, LEFT 08/27/2009   Past Medical History  Diagnosis Date  . Allergy   . Arthritis   . Insomnia   . PONV (postoperative nausea and vomiting)     severe n/v-needs scop  . Constipation, chronic   . IBS (irritable bowel syndrome)    Past Surgical History  Procedure Laterality Date  . Knee arthroscopy  2003    lt  . Knee arthroscopy      right  . Fracture surgery  1996    fx tib-fib-rt  . Hardware removal  1996    rt ankle  . Dilation and curettage of uterus  2006  . Tonsillectomy    . Colonoscopy    . Orif clavicular fracture Right 05/28/2013    Procedure: OPEN REDUCTION INTERNAL FIXATION (ORIF) CLAVICULAR FRACTURE;  Surgeon: Renette Butters, MD;  Location: [facility];  Service: Orthopedics;  Laterality: Right;   Allergies  Allergen Reactions  . Effexor [Venlafaxine Hydrochloride] Other (See Comments)    dizzy  . Hydrocodone Nausea And Vomiting  . Ampicillin Rash   Prior to Admission medications  Medication Sig Start Date End Date Taking? Authorizing Provider  Ascorbic Acid (VITAMIN C) 1000 MG tablet Take 1,000 mg by mouth daily.   Yes Historical Provider, MD  atorvastatin (LIPITOR) 40 MG tablet Take 40 mg by mouth daily.   Yes Historical Provider, MD  Cholecalciferol (VITAMIN D3) 1000 UNITS CAPS Take by mouth daily.   Yes Historical Provider, MD  clonazePAM (KLONOPIN) 0.5 MG tablet Take 0.5-1 tablets (0.25-0.5 mg total) by mouth 2 (two) times daily. 10/22/13  Yes Erika Agreste, MD  diazepam (VALIUM) 5 MG tablet Take 1 tablet (5 mg total) by mouth every 6 (six) hours as needed for anxiety (spasms). 05/28/13  Yes Renette Butters, MD  ezetimibe-simvastatin (VYTORIN) 10-20  MG per tablet Take 1 tablet by mouth every other day. 12/27/11  Yes Darlyne Russian, MD  hyoscyamine (LEVSIN/SL) 0.125 MG SL tablet Place 1 tablet (0.125 mg total) under the tongue every 4 (four) hours as needed for cramping. 05/02/13  Yes Robyn Haber, MD  meloxicam (MOBIC) 7.5 MG tablet Take 7.5 mg by mouth daily.   Yes Historical Provider, MD  polyethylene glycol (MIRALAX / GLYCOLAX) packet Take 17 g by mouth daily.   Yes Historical Provider, MD  sertraline (ZOLOFT) 50 MG tablet Take 1 tablet (50 mg total) by mouth daily. 10/22/13  Yes Erika Agreste, MD  traMADol (ULTRAM) 50 MG tablet Take 1 tablet (50 mg total) by mouth every 8 (eight) hours as needed for pain. 05/02/13  Yes Robyn Haber, MD  valACYclovir (VALTREX) 1000 MG tablet Take 2 tabs twice a day for 2 doses for flareups of Cold sores 12/07/12  Yes Darlyne Russian, MD  HYDROcodone-acetaminophen (NORCO/VICODIN) 5-325 MG per tablet Take 1 tablet by mouth every 6 (six) hours as needed for moderate pain.    Historical Provider, MD  ibuprofen (ADVIL,MOTRIN) 800 MG tablet Take 1 tablet (800 mg total) by mouth 3 (three) times daily. 05/23/13   Hannah Muthersbaugh, PA-C  methocarbamol (ROBAXIN) 500 MG tablet Take 1 tablet (500 mg total) by mouth 2 (two) times daily. 05/23/13   Hannah Muthersbaugh, PA-C  ondansetron (ZOFRAN ODT) 8 MG disintegrating tablet 8mg  ODT q4 hours prn nausea 05/23/13   Jarrett Soho Muthersbaugh, PA-C  oxyCODONE-acetaminophen (PERCOCET/ROXICET) 5-325 MG per tablet Take 1 tablet by mouth every 4 (four) hours as needed for pain. 05/28/13   Renette Butters, MD  zolpidem (AMBIEN) 10 MG tablet Take 1 tablet (10 mg total) by mouth at bedtime as needed. 10/31/12   Thao P Le, DO   History   Social History  . Marital Status: Married    Spouse Name: N/A    Number of Children: N/A  . Years of Education: N/A   Occupational History  . Not on file.   Social History Main Topics  . Smoking status: Never Smoker   . Smokeless tobacco: Not on  file  . Alcohol Use: Yes  . Drug Use: No  . Sexual Activity: Yes   Other Topics Concern  . Not on file   Social History Narrative  . No narrative on file      Review of Systems  Constitutional: Negative for fever.  HENT: Negative for drooling.   Eyes: Negative for discharge.  Respiratory: Negative for cough.   Cardiovascular: Negative for leg swelling.  Gastrointestinal: Positive for constipation. Negative for vomiting.  Genitourinary: Negative for hematuria.  Musculoskeletal: Negative for gait problem.  Skin: Negative for rash.  Allergic/Immunologic: Negative for immunocompromised state.  Neurological: Negative for  speech difficulty.  Hematological: Negative for adenopathy.  Psychiatric/Behavioral: Positive for sleep disturbance. Negative for suicidal ideas and confusion. The patient is nervous/anxious.        Objective:   Physical Exam  Nursing note and vitals reviewed. Constitutional: She is oriented to person, place, and time. She appears well-developed and well-nourished. No distress.  HENT:  Head: Normocephalic and atraumatic.  Eyes: EOM are normal.  Neck: Neck supple. No tracheal deviation present.  Cardiovascular: Normal rate and regular rhythm.  Exam reveals no gallop and no friction rub.   No murmur heard. Pulmonary/Chest: Effort normal and breath sounds normal. No respiratory distress. She has no wheezes. She has no rales.  Musculoskeletal: Normal range of motion.  Neurological: She is alert and oriented to person, place, and time.  Skin: Skin is warm and dry.  Psychiatric: She has a normal mood and affect. Her behavior is normal.    Filed Vitals:   11/05/13 0803  BP: 120/74  Pulse: 76  Temp: 98.9 F (37.2 C)  TempSrc: Oral  Resp: 16  Height: 5' 10.5" (1.791 m)  Weight: 145 lb (65.772 kg)  SpO2: 96%        Assessment & Plan:   KATANA BERTHOLD is a 63 y.o. female Depression with anxiety - Plan: sertraline (ZOLOFT) 100 MG  tablet  Insomnia - Plan: sertraline (ZOLOFT) 100 MG tablet Persistent perseveration., excessive worry with depression/anhedonia.  Tolerating Zoloft, but higher dose likely needed with anxiety sx's. Increase to 100mg .  RTC/ER precautions discussed, specifically if any increase or change in SI sx's. Continue CBT with therapist. Exercise discussed.   Insomnia - increase Klonopin to 1 qhs.  If not improving - consider change back to Ambien.   Constipation - intermittent. Longstanding.  Has Miralax if needed, but if persistent, consider IBS and other treatments.  Can discuss further at follow up.    Meds ordered this encounter  Medications  . sertraline (ZOLOFT) 100 MG tablet    Sig: Take 1 tablet (100 mg total) by mouth daily.    Dispense:  30 tablet    Refill:  3   Patient Instructions  Increase zoloft to 100mg  each day (2 of the 50mg  until out, then fill new prescription for 100mg  - 1 each day).  Can increase to full Klonopin at night. Exercise in some form 3 times per week - eventual goal 150 minutes per week. Recheck in 6 weeks.     I personally performed the services described in this documentation, which was scribed in my presence. The recorded information has been reviewed and considered, and addended by me as needed.

## 2013-11-05 NOTE — Patient Instructions (Signed)
Increase zoloft to 100mg  each day (2 of the 50mg  until out, then fill new prescription for 100mg  - 1 each day).  Can increase to full Klonopin at night. Exercise in some form 3 times per week - eventual goal 150 minutes per week. Recheck in 6 weeks.

## 2013-11-07 ENCOUNTER — Telehealth: Payer: Self-pay | Admitting: Emergency Medicine

## 2013-11-07 NOTE — Telephone Encounter (Signed)
Left vm for patient to give [name] a call back to schedule a 6-7 weeks follow up depression/anxiety with Erika Johns

## 2013-11-21 ENCOUNTER — Other Ambulatory Visit: Payer: Self-pay | Admitting: Family Medicine

## 2013-11-22 NOTE — Telephone Encounter (Signed)
Refilled, ready for pickup.  Follow up as scheduled.

## 2013-11-23 ENCOUNTER — Telehealth: Payer: Self-pay

## 2013-11-23 NOTE — Telephone Encounter (Addendum)
PT WOULD LIKE A REFILL ON KLONOPIN, SHE WOULD ALSO LIKE TO ADD THAT SHE IS ALMOST OUT. BEST# [phone]

## 2013-11-23 NOTE — Telephone Encounter (Signed)
Rx has been printed, faxed it in for her and let her know

## 2013-11-25 ENCOUNTER — Other Ambulatory Visit: Payer: Self-pay | Admitting: Family Medicine

## 2013-12-24 ENCOUNTER — Other Ambulatory Visit: Payer: Self-pay | Admitting: Family Medicine

## 2013-12-24 DIAGNOSIS — F418 Other specified anxiety disorders: Secondary | ICD-10-CM

## 2013-12-26 ENCOUNTER — Telehealth: Payer: Self-pay

## 2013-12-26 NOTE — Telephone Encounter (Signed)
Patient called requesting a refill on Clonazpam 0.5 mg. [facility] on [address]. Patient states she is going to run out of her medication by Saturday. Patient states to call her at work [phone]

## 2013-12-27 NOTE — Telephone Encounter (Signed)
Faxed

## 2013-12-27 NOTE — Telephone Encounter (Signed)
Should be due for follow up next few weeks. Refilled until then.

## 2013-12-30 ENCOUNTER — Encounter: Payer: Self-pay | Admitting: Family Medicine

## 2013-12-30 ENCOUNTER — Ambulatory Visit (INDEPENDENT_AMBULATORY_CARE_PROVIDER_SITE_OTHER): Payer: BC Managed Care – PPO | Admitting: Family Medicine

## 2013-12-30 VITALS — BP 128/76 | HR 60 | Resp 16 | Ht 70.0 in | Wt 147.0 lb

## 2013-12-30 DIAGNOSIS — F341 Dysthymic disorder: Secondary | ICD-10-CM

## 2013-12-30 DIAGNOSIS — F418 Other specified anxiety disorders: Secondary | ICD-10-CM

## 2013-12-30 DIAGNOSIS — F411 Generalized anxiety disorder: Secondary | ICD-10-CM

## 2013-12-30 DIAGNOSIS — G4700 Insomnia, unspecified: Secondary | ICD-10-CM

## 2013-12-30 MED ORDER — CLONAZEPAM 0.5 MG PO TABS: ORAL_TABLET | ORAL | Status: DC

## 2013-12-30 NOTE — Patient Instructions (Signed)
Pneumonia vaccine at age 63 (can be done at physical).   Continue same dose of Zoloft (100mg ) and klonopin 1/2-1/ twice per day as needed.   recheck in 2-3 months.   Return to the clinic or go to the nearest emergency room if any of your symptoms worsen or new symptoms occur.

## 2013-12-30 NOTE — Progress Notes (Signed)
This chart was scribed for [name], MD by Marcha Dutton, ED Scribe. This patient was seen in room 21 and the patient's care was started at 1:46 PM.  Subjective:    Patient ID: Erika Johns, female    DOB: 03-13-1951, 63 y.o.   MRN: [id number]   Chief Complaint  Patient presents with  . Follow-up    needs refill of the Klonopin, feels better  . Depression    and anxiety/ feels better with medications   . Immunizations    has questions about pneumovac    HPI ID: Erika Johns is a 63 y.o. female  PCP:  DAUB, STEVE A, MD  Pt last seen 11/05/2013 for depression. She was seen approximately 2 weeks prior and was prescribed zoloft 50 mg QD, her xanax was changed to [place], and referred to Vivia Budge for counseling. Pt taking klonopin .5 tablet twice a day, only one dose of ambien at that time, and no valium.  At the 11/05/1013 visit, pt was struggling with defeatist thoughts complicated by a bad review from a new manager at work. Starting to increase exercise at that time. Noted to have persistent perseveration and excessive worry with her depression symptoms and anhedonia. Increased zoloft to 100 mg QD and continued CBD with therapist. Increased Kolonipin 1 mg QHD and increased exercise to 50 minutes per week with goal of 150 minutes per week.   Constipation: Pt states she feels some of her constipation minimizing as her stress decreases. She reports she still has to occasionally use fleet. Pt has used Miralax in the past with some results, but she found it increased her number of BM's and reduced the amount of stool she put out. She describes it as "extruding." She sees a gastroenterologist 01/09/2014, Melvyn Neth, [place], in [place].   Anxiety: Pt reports she has seen a 90% improvement in her symptoms. She states she feels the weight on her shoulders has dissipated. She notes that at work she feels slightly lonely still and would like to feel more  social interaction with her coworkers. She states her anxiety has continued to bother her in social situations. She notes a situation meeting her husband's coworker and that her knees were shaking; however, the same day, she states she went on a 30 mile bike ride and felt much better. She links these situations to a feeling of not being "good enough" or not being able to bring anything to the table in terms of her own self worth in relation to the people with whom she is interacting. She reports successfully managing these feelings with exercise. She states she does yoga every evening before bed and believes this to be reducing some of her anxiety. She states taking 0.5 mg klonopin every night which is helping with her sleep and then 0.25 mg during the day. Pt states she does feel associated grogginess in the morning which she has combatted with stretching. She also notes associated dry mouth. She reports she is still not taking ambien and valium.   She states she uses tramadol when her arthritis bothers her.  Pt concerned she may be at risk for pneumonia and would like to know more about getting the Pneumonia vaccine.   Patient Active Problem List   Diagnosis Date Noted  . KNEE PAIN 08/27/2009  . LUMBAGO 08/27/2009  . 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 HAND PAIN, LEFT 08/27/2009    Past Medical History  Diagnosis Date  . Allergy   .  Arthritis   . Insomnia   . PONV (postoperative nausea and vomiting)     severe n/v-needs scop  . Constipation, chronic   . IBS (irritable bowel syndrome)   . Anxiety   . Depression     Past Surgical History  Procedure Laterality Date  . Knee arthroscopy  2003    lt  . Knee arthroscopy      right  . Fracture surgery  1996    fx tib-fib-rt  . Hardware removal  1996    rt ankle  . Dilation and curettage of uterus  2006  . Tonsillectomy    . Colonoscopy    . Orif clavicular fracture Right 05/28/2013    Procedure: OPEN REDUCTION INTERNAL FIXATION  (ORIF) CLAVICULAR FRACTURE;  Surgeon: Timothy D Murphy, MD;  Location: [facility];  Service: Orthopedics;  Laterality: Right;    Allergies  Allergen Reactions  . Effexor [Venlafaxine Hydrochloride] Other (See Comments)    dizzy  . Hydrocodone Nausea And Vomiting  . Ampicillin Rash    Prior to Admission medications   Medication Sig Start Date End Date Taking? Authorizing Provider  Ascorbic Acid (VITAMIN C) 1000 MG tablet Take 1,000 mg by mouth daily.    Historical Provider, MD  atorvastatin (LIPITOR) 40 MG tablet Take 40 mg by mouth daily.    Historical Provider, MD  Cholecalciferol (VITAMIN D3) 1000 UNITS CAPS Take by mouth daily.    Historical Provider, MD  clonazePAM (KLONOPIN) 0.5 MG tablet TAKE 1/2 TO 1 TABLET TWICE A DAY    Jeffrey R Greene, MD  diazepam (VALIUM) 5 MG tablet Take 1 tablet (5 mg total) by mouth every 6 (six) hours as needed for anxiety (spasms). 05/28/13   Timothy D Murphy, MD  ezetimibe-simvastatin (VYTORIN) 10-20 MG per tablet Take 1 tablet by mouth every other day. 12/27/11   Steven A Daub, MD  hyoscyamine (LEVSIN/SL) 0.125 MG SL tablet Place 1 tablet (0.125 mg total) under the tongue every 4 (four) hours as needed for cramping. 05/02/13   Kurt Lauenstein, MD  ibuprofen (ADVIL,MOTRIN) 800 MG tablet Take 1 tablet (800 mg total) by mouth 3 (three) times daily. 05/23/13   Hannah Muthersbaugh, PA-C  meloxicam (MOBIC) 7.5 MG tablet Take 7.5 mg by mouth daily.    Historical Provider, MD  methocarbamol (ROBAXIN) 500 MG tablet Take 1 tablet (500 mg total) by mouth 2 (two) times daily. 05/23/13   Hannah Muthersbaugh, PA-C  ondansetron (ZOFRAN ODT) 8 MG disintegrating tablet 8mg ODT q4 hours prn nausea 05/23/13   Hannah Muthersbaugh, PA-C  oxyCODONE-acetaminophen (PERCOCET/ROXICET) 5-325 MG per tablet Take 1 tablet by mouth every 4 (four) hours as needed for pain. 05/28/13   Timothy D Murphy, MD  polyethylene glycol (MIRALAX / GLYCOLAX) packet Take 17 g by mouth daily.     Historical Provider, MD  sertraline (ZOLOFT) 100 MG tablet Take 1 tablet (100 mg total) by mouth daily. 11/05/13   Jeffrey R Greene, MD  traMADol (ULTRAM) 50 MG tablet Take 1 tablet (50 mg total) by mouth every 8 (eight) hours as needed for pain. 05/02/13   Kurt Lauenstein, MD  valACYclovir (VALTREX) 1000 MG tablet Take 2 tabs twice a day for 2 doses for flareups of Cold sores 12/07/12   Steven A Daub, MD  zolpidem (AMBIEN) 10 MG tablet Take 1 tablet (10 mg total) by mouth at bedtime as needed. 10/31/12   Thao P Le, DO    History   Social History  . Marital Status:   Married    Spouse Name: N/A    Number of Children: N/A  . Years of Education: N/A   Occupational History  . Not on file.   Social History Main Topics  . Smoking status: Never Smoker   . Smokeless tobacco: Not on file  . Alcohol Use: Yes  . Drug Use: No  . Sexual Activity: Yes   Other Topics Concern  . Not on file   Social History Narrative  . No narrative on file    Review of Systems  Constitutional: Negative for fever and activity change.  HENT: Negative for congestion.   Respiratory: Negative for cough and shortness of breath.   Cardiovascular: Positive for leg swelling.  Gastrointestinal: Positive for constipation. Negative for nausea, vomiting, abdominal pain and diarrhea.  Musculoskeletal: Positive for arthralgias and joint swelling.  Psychiatric/Behavioral: The patient is nervous/anxious.        Objective:   Physical Exam  Nursing note and vitals reviewed. Constitutional: She is oriented to person, place, and time. She appears well-developed and well-nourished. No distress.  HENT:  Head: Normocephalic and atraumatic.  Right Ear: External ear normal.  Left Ear: External ear normal.  Nose: Nose normal.  Mouth/Throat: No oropharyngeal exudate.  Eyes: Conjunctivae and EOM are normal. Pupils are equal, round, and reactive to light. Right eye exhibits no discharge. Left eye exhibits no discharge. No  scleral icterus.  Neck: Normal range of motion. Neck supple. No tracheal deviation present.  Cardiovascular: Normal rate, regular rhythm, normal heart sounds and intact distal pulses.  Exam reveals no gallop and no friction rub.   No murmur heard. Pulmonary/Chest: Effort normal and breath sounds normal. No stridor. No respiratory distress. She has no wheezes. She has no rales.  Abdominal: Soft. Bowel sounds are normal. She exhibits no distension. There is no tenderness. There is no rebound and no guarding.  Musculoskeletal: Normal range of motion. She exhibits no edema and no tenderness.  Neurological: She is alert and oriented to person, place, and time. She has normal strength. No cranial nerve deficit (no facial droop, extraocular movements intact, no slurred speech) or sensory deficit. She exhibits normal muscle tone. She displays no seizure activity. Coordination normal.  Skin: Skin is warm and dry. No rash noted. She is not diaphoretic. No erythema.  Psychiatric: She has a normal mood and affect. Her behavior is normal.    Triage Vitals: BP 128/76  Pulse 60  Resp 16  Ht 5' 10" (1.778 m)  Wt 147 lb (66.679 kg)  BMI 21.09 kg/m2  SpO2 99%     Assessment & Plan:    Erika Johns is a 63 y.o. female Situational anxiety, Depression with anxiety,Insomnia  - improving.  Still some self defeating/inadequate thoughts that can continue to met with counseling. Improved affect on higher dose of Zoloft. Ok to continue xanax at current dose, but in next 3-6 months, discussed goal of weaning off this medicine. recheck in 2-3 months.   Plan on PNA vaccination at 64 yo -prevnar followed by pneumovax. Can be discussed further at CPE.     Meds ordered this encounter  Medications  . clonazePAM (KLONOPIN) 0.5 MG tablet    Sig: TAKE 1/2 TO 1 TABLET TWICE A DAY    Dispense:  45 tablet    Refill:  1    May fill after 01/06/14.   Patient Instructions  Pneumonia vaccine at age 65 (can  be done at physical).   Continue same dose of Zoloft (100mg) and   klonopin 1/2-1/ twice per day as needed.   recheck in 2-3 months.   Return to the clinic or go to the nearest emergency room if any of your symptoms worsen or new symptoms occur.    I personally performed the services described in this documentation, which was scribed in my presence. The recorded information has been reviewed and considered, and addended by me as needed.     

## 2014-01-04 LAB — HM MAMMOGRAPHY

## 2014-01-08 ENCOUNTER — Other Ambulatory Visit: Payer: Self-pay | Admitting: Family Medicine

## 2014-01-08 DIAGNOSIS — J309 Allergic rhinitis, unspecified: Secondary | ICD-10-CM

## 2014-01-08 MED ORDER — FLUTICASONE PROPIONATE 50 MCG/ACT NA SUSP: 2.0000 | Freq: Every day | NASAL | Status: DC

## 2014-01-09 DIAGNOSIS — K5900 Constipation, unspecified: Secondary | ICD-10-CM | POA: Insufficient documentation

## 2014-03-17 ENCOUNTER — Encounter: Payer: Self-pay | Admitting: Family Medicine

## 2014-03-17 ENCOUNTER — Ambulatory Visit (INDEPENDENT_AMBULATORY_CARE_PROVIDER_SITE_OTHER): Payer: BC Managed Care – PPO | Admitting: Family Medicine

## 2014-03-17 VITALS — BP 116/66 | HR 51 | Temp 98.9°F | Resp 16 | Ht 70.25 in | Wt 147.0 lb

## 2014-03-17 DIAGNOSIS — R194 Change in bowel habit: Secondary | ICD-10-CM

## 2014-03-17 DIAGNOSIS — R198 Other specified symptoms and signs involving the digestive system and abdomen: Secondary | ICD-10-CM

## 2014-03-17 DIAGNOSIS — F418 Other specified anxiety disorders: Secondary | ICD-10-CM

## 2014-03-17 DIAGNOSIS — F341 Dysthymic disorder: Secondary | ICD-10-CM

## 2014-03-17 DIAGNOSIS — G4700 Insomnia, unspecified: Secondary | ICD-10-CM

## 2014-03-17 DIAGNOSIS — F411 Generalized anxiety disorder: Secondary | ICD-10-CM

## 2014-03-17 MED ORDER — SERTRALINE HCL 100 MG PO TABS: 100.0000 mg | ORAL_TABLET | Freq: Every day | ORAL | Status: DC

## 2014-03-17 MED ORDER — CLONAZEPAM 0.5 MG PO TABS: ORAL_TABLET | ORAL | Status: DC

## 2014-03-17 NOTE — Patient Instructions (Signed)
No change in meds at this time.  If your abdominal issues worsen - let me know and try to keep a diary of when these symptoms occur. Keep follow up with Vivia Budge, and recheck with me in 3 months.  Return to the clinic or go to the nearest emergency room if any of your symptoms worsen or new symptoms occur.

## 2014-03-17 NOTE — Progress Notes (Signed)
Subjective:  This chart was scribed for [name], MD by Mercy Moore, Medial Scribe. This patient was seen in room 25 and the patient's care was started at 5:22 PM.    Patient ID: Erika Johns, female    DOB: 1951/03/31, 63 y.o.   MRN: [id number]  HPI Erika Johns is a 63 y.o. female PCP: Jenny Reichmann, MD  Patient presents to the [facility] requesting two-month follow up for her anxiety. Last seen 12/30/13. History of anxiety was much improved then. After see started on Zolfot 100 mg. Klonopin as needed and referred to Vivia Budge for counseling. Had increased exercise which also helped. No changes in medications at that time.   Anxiety. Patient states that overall she has been doing well, but she does have some "down days." She is still seeing Marjory Lies every three weeks. Last visit this morning. Patient reports emotional difficulty with her husband's terminal illness and related issues. She is also concerned about her mother's impending death and expresses feel of losing both her husband and mother. Patient denies suicidal ideation.   Patient reports dry mouth with Zoloft but denies any other complaints.   Exercise. Patient reports cycling and yoga.   Constipation. Patient reports nausea with riding for about one week. Patient reports having nausea and frequent small stools when she is not taking her Klonopin, especially on the weekend. Patient states she takes half pill in the morning and whole at bedtime. On weekends she only takes one at bedtime. History of constipation and has been seen by GI specialist. Does take Citrucel.    Patient Active Problem List   Diagnosis Date Noted  . KNEE PAIN 08/27/2009  . LUMBAGO 08/27/2009  . 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 HAND PAIN, LEFT 08/27/2009   Past Medical History  Diagnosis Date  . Allergy   . Arthritis   . Insomnia   . PONV (postoperative nausea and vomiting)     severe  n/v-needs scop  . Constipation, chronic   . IBS (irritable bowel syndrome)   . Anxiety   . Depression    Past Surgical History  Procedure Laterality Date  . Knee arthroscopy  2003    lt  . Knee arthroscopy      right  . Fracture surgery  1996    fx tib-fib-rt  . Hardware removal  1996    rt ankle  . Dilation and curettage of uterus  2006  . Tonsillectomy    . Colonoscopy    . Orif clavicular fracture Right 05/28/2013    Procedure: OPEN REDUCTION INTERNAL FIXATION (ORIF) CLAVICULAR FRACTURE;  Surgeon: Renette Butters, MD;  Location: [facility];  Service: Orthopedics;  Laterality: Right;   Allergies  Allergen Reactions  . Effexor [Venlafaxine Hydrochloride] Other (See Comments)    dizzy  . Hydrocodone Nausea And Vomiting  . Ampicillin Rash   Prior to Admission medications   Medication Sig Start Date End Date Taking? Authorizing Provider  Ascorbic Acid (VITAMIN C) 1000 MG tablet Take 1,000 mg by mouth daily.    Historical Provider, MD  atorvastatin (LIPITOR) 40 MG tablet Take 40 mg by mouth daily.    Historical Provider, MD  Cholecalciferol (VITAMIN D3) 1000 UNITS CAPS Take by mouth daily.    Historical Provider, MD  clonazePAM (KLONOPIN) 0.5 MG tablet TAKE 1/2 TO 1 TABLET TWICE A DAY 12/30/13   [name], MD  diazepam (VALIUM) 5 MG tablet Take 1 tablet (  5 mg total) by mouth every 6 (six) hours as needed for anxiety (spasms). 05/28/13   Renette Butters, MD  ezetimibe-simvastatin (VYTORIN) 10-20 MG per tablet Take 1 tablet by mouth every other day. 12/27/11   Darlyne Russian, MD  fluticasone (FLONASE) 50 MCG/ACT nasal spray Place 2 sprays into both nostrils daily. 01/08/14   [name], MD  hyoscyamine (LEVSIN/SL) 0.125 MG SL tablet Place 1 tablet (0.125 mg total) under the tongue every 4 (four) hours as needed for cramping. 05/02/13   Robyn Haber, MD  ibuprofen (ADVIL,MOTRIN) 800 MG tablet Take 1 tablet (800 mg total) by mouth 3 (three) times daily.  05/23/13   Hannah Muthersbaugh, PA-C  meloxicam (MOBIC) 7.5 MG tablet Take 7.5 mg by mouth daily.    Historical Provider, MD  ondansetron (ZOFRAN ODT) 8 MG disintegrating tablet 8mg  ODT q4 hours prn nausea 05/23/13   Hannah Muthersbaugh, PA-C  polyethylene glycol (MIRALAX / GLYCOLAX) packet Take 17 g by mouth daily.    Historical Provider, MD  sertraline (ZOLOFT) 100 MG tablet Take 1 tablet (100 mg total) by mouth daily. 11/05/13   [name], MD  traMADol (ULTRAM) 50 MG tablet Take 1 tablet (50 mg total) by mouth every 8 (eight) hours as needed for pain. 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 10/31/12   Thao P Le, DO   History   Social History  . Marital Status: Married    Spouse Name: N/A    Number of Children: N/A  . Years of Education: N/A   Occupational History  . Not on file.   Social History Main Topics  . Smoking status: Never Smoker   . Smokeless tobacco: Not on file  . Alcohol Use: Yes  . Drug Use: No  . Sexual Activity: Yes   Other Topics Concern  . Not on file   Social History Narrative  . No narrative on file       Review of Systems  Constitutional: Negative for fever and chills.  Gastrointestinal: Positive for nausea and constipation.  Psychiatric/Behavioral: Negative for suicidal ideas.       Objective:   Physical Exam  Nursing note and vitals reviewed. Constitutional: She is oriented to person, place, and time. She appears well-developed and well-nourished. No distress.  HENT:  Head: Normocephalic and atraumatic.  Eyes: Conjunctivae and EOM are normal. Pupils are equal, round, and reactive to light.  Neck: Neck supple. Carotid bruit is not present.  Cardiovascular: Normal rate, regular rhythm, normal heart sounds and intact distal pulses.    Pulmonary/Chest: Effort normal and breath sounds normal. No respiratory distress. She has no wheezes. She has no rales.  Abdominal: Soft. She exhibits no pulsatile midline mass. There is no tenderness.  Musculoskeletal: Normal range of motion.  Neurological: She is alert and oriented to person, place, and time.  Skin: Skin is warm and dry.  Psychiatric: She has a normal mood and affect. Her behavior is normal.   Filed Vitals:   03/17/14 1700  BP: 116/66  Pulse: 51  Temp: 98.9 F (37.2 C)  TempSrc: Oral  Resp: 16  Height: 5' 10.25" (1.784 m)  Weight: 147 lb (66.679 kg)  SpO2: 99%  Assessment & Plan:   Erika Johns is a 63 y.o. female Situational anxiety, depression with anxiety - Plan: sertraline (ZOLOFT) 100 MG tablet Insomnia - Plan: sertraline (ZOLOFT) 100 MG tablet  -stable. Decided on continuing same dose of Zoloft, cont counsleing, exercise and if needed - Klonopin.   Frequent bowel movements  -hx of constipation, but with incr bowel mvmts off Klonopin, may have some stress/IBS component - RTC or discuss with her gastroenterologist if persists.   Meds ordered this encounter  Medications  . clonazePAM (KLONOPIN) 0.5 MG tablet    Sig: TAKE 1/2 TO 1 TABLET TWICE A DAY    Dispense:  45 tablet    Refill:  1  . sertraline (ZOLOFT) 100 MG tablet    Sig: Take 1 tablet (100 mg total) by mouth daily.    Dispense:  90 tablet    Refill:  1   Patient Instructions  No change in meds at this time.  If your abdominal issues worsen - let me know and try to keep a diary of when these symptoms occur. Keep follow up with Vivia Budge, and recheck with me in 3 months.  Return to the clinic or go to the nearest emergency room if any of your symptoms worsen or new symptoms occur.     I personally performed the services described in this documentation, which was scribed in my presence. The recorded information has been reviewed and considered, and addended by me  as needed.

## 2014-04-10 ENCOUNTER — Ambulatory Visit (INDEPENDENT_AMBULATORY_CARE_PROVIDER_SITE_OTHER): Payer: BC Managed Care – PPO | Admitting: Family Medicine

## 2014-04-10 VITALS — BP 124/78 | HR 71 | Temp 98.2°F | Resp 20 | Ht 70.5 in | Wt 148.0 lb

## 2014-04-10 DIAGNOSIS — R300 Dysuria: Secondary | ICD-10-CM

## 2014-04-10 DIAGNOSIS — N390 Urinary tract infection, site not specified: Secondary | ICD-10-CM

## 2014-04-10 LAB — POCT UA - MICROSCOPIC ONLY
Bacteria, U Microscopic: NEGATIVE
CASTS, UR, LPF, POC: NEGATIVE
Crystals, Ur, HPF, POC: NEGATIVE
Mucus, UA: NEGATIVE
Yeast, UA: NEGATIVE

## 2014-04-10 LAB — POCT URINALYSIS DIPSTICK
Bilirubin, UA: NEGATIVE
Blood, UA: NEGATIVE
GLUCOSE UA: NEGATIVE
Ketones, UA: NEGATIVE
Nitrite, UA: NEGATIVE
PROTEIN UA: NEGATIVE
SPEC GRAV UA: 1.01
UROBILINOGEN UA: 0.2
pH, UA: 7

## 2014-04-10 MED ORDER — CIPROFLOXACIN HCL 500 MG PO TABS: 500.0000 mg | ORAL_TABLET | Freq: Two times a day (BID) | ORAL | Status: DC

## 2014-04-10 NOTE — Patient Instructions (Signed)
Urinary Tract Infection °Urinary tract infections (UTIs) can develop anywhere along your urinary tract. Your urinary tract is your body's drainage system for removing wastes and extra water. Your urinary tract includes two kidneys, two ureters, a bladder, and a urethra. Your kidneys are a pair of bean-shaped organs. Each kidney is about the size of your fist. They are located below your ribs, one on each side of your spine. °CAUSES °Infections are caused by microbes, which are microscopic organisms, including fungi, viruses, and bacteria. These organisms are so small that they can only be seen through a microscope. Bacteria are the microbes that most commonly cause UTIs. °SYMPTOMS  °Symptoms of UTIs may vary by age and gender of the patient and by the location of the infection. Symptoms in young women typically include a frequent and intense urge to urinate and a painful, burning feeling in the bladder or urethra during urination. Older women and men are more likely to be tired, shaky, and weak and have muscle aches and abdominal pain. A fever may mean the infection is in your kidneys. Other symptoms of a kidney infection include pain in your back or sides below the ribs, nausea, and vomiting. °DIAGNOSIS °To diagnose a UTI, your caregiver will ask you about your symptoms. Your caregiver also will ask to provide a urine sample. The urine sample will be tested for bacteria and white blood cells. White blood cells are made by your body to help fight infection. °TREATMENT  °Typically, UTIs can be treated with medication. Because most UTIs are caused by a bacterial infection, they usually can be treated with the use of antibiotics. The choice of antibiotic and length of treatment depend on your symptoms and the type of bacteria causing your infection. °HOME CARE INSTRUCTIONS °· If you were prescribed antibiotics, take them exactly as your caregiver instructs you. Finish the medication even if you feel better after you  have only taken some of the medication. °· Drink enough water and fluids to keep your urine clear or pale yellow. °· Avoid caffeine, tea, and carbonated beverages. They tend to irritate your bladder. °· Empty your bladder often. Avoid holding urine for long periods of time. °· Empty your bladder before and after sexual intercourse. °· After a bowel movement, women should cleanse from front to back. Use each tissue only once. °SEEK MEDICAL CARE IF:  °· You have back pain. °· You develop a fever. °· Your symptoms do not begin to resolve within 3 days. °SEEK IMMEDIATE MEDICAL CARE IF:  °· You have severe back pain or lower abdominal pain. °· You develop chills. °· You have nausea or vomiting. °· You have continued burning or discomfort with urination. °MAKE SURE YOU:  °· Understand these instructions. °· Will watch your condition. °· Will get help right away if you are not doing well or get worse. °Document Released: 05/18/2005 Document Revised: 02/07/2012 Document Reviewed: 09/16/2011 °[employer]® Patient Information ©2015 [employer]. This information is not intended to replace advice given to you by your health care provider. Make sure you discuss any questions you have with your health care provider. ° °
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
## 2014-04-12 LAB — URINE CULTURE
Colony Count: NO GROWTH
Organism ID, Bacteria: NO GROWTH

## 2014-04-16 ENCOUNTER — Telehealth: Payer: Self-pay | Admitting: Family Medicine

## 2014-04-16 NOTE — Telephone Encounter (Signed)
My chart results

## 2014-05-17 ENCOUNTER — Other Ambulatory Visit: Payer: Self-pay | Admitting: Family Medicine

## 2014-05-18 ENCOUNTER — Encounter: Payer: Self-pay | Admitting: Family Medicine

## 2014-05-19 NOTE — Telephone Encounter (Signed)
Refilled. Should be ready for pickup on Tuesday 05/20/14.

## 2014-05-20 ENCOUNTER — Other Ambulatory Visit: Payer: Self-pay | Admitting: Family Medicine

## 2014-06-23 ENCOUNTER — Ambulatory Visit (INDEPENDENT_AMBULATORY_CARE_PROVIDER_SITE_OTHER): Payer: BC Managed Care – PPO | Admitting: Family Medicine

## 2014-06-23 VITALS — BP 116/72 | HR 77 | Temp 98.8°F | Resp 18 | Ht 70.5 in | Wt 146.0 lb

## 2014-06-23 DIAGNOSIS — J029 Acute pharyngitis, unspecified: Secondary | ICD-10-CM

## 2014-06-23 DIAGNOSIS — N949 Unspecified condition associated with female genital organs and menstrual cycle: Secondary | ICD-10-CM

## 2014-06-23 DIAGNOSIS — R309 Painful micturition, unspecified: Secondary | ICD-10-CM

## 2014-06-23 DIAGNOSIS — J0100 Acute maxillary sinusitis, unspecified: Secondary | ICD-10-CM

## 2014-06-23 DIAGNOSIS — E785 Hyperlipidemia, unspecified: Secondary | ICD-10-CM

## 2014-06-23 LAB — POCT URINALYSIS DIPSTICK
Bilirubin, UA: NEGATIVE
Blood, UA: NEGATIVE
Glucose, UA: NEGATIVE
Ketones, UA: NEGATIVE
Nitrite, UA: NEGATIVE
Protein, UA: NEGATIVE
Spec Grav, UA: 1.01
Urobilinogen, UA: 0.2
pH, UA: 7

## 2014-06-23 LAB — POCT WET PREP WITH KOH
KOH Prep POC: NEGATIVE
Trichomonas, UA: NEGATIVE
Yeast Wet Prep HPF POC: NEGATIVE

## 2014-06-23 LAB — POCT UA - MICROSCOPIC ONLY
Bacteria, U Microscopic: NEGATIVE
Casts, Ur, LPF, POC: NEGATIVE
Crystals, Ur, HPF, POC: NEGATIVE
Mucus, UA: NEGATIVE
RBC, urine, microscopic: NEGATIVE
Yeast, UA: NEGATIVE

## 2014-06-23 LAB — LIPID PANEL
Cholesterol: 150 mg/dL (ref 0–200)
HDL: 59 mg/dL (ref >39)
LDL Cholesterol: 75 mg/dL (ref 0–99)
Total CHOL/HDL Ratio: 2.5 Ratio
Triglycerides: 78 mg/dL (ref <150)
VLDL: 16 mg/dL (ref 0–40)

## 2014-06-23 LAB — TSH: TSH: 1.985 u[IU]/mL (ref 0.350–4.500)

## 2014-06-23 LAB — COMPLETE METABOLIC PANEL WITHOUT GFR
CO2: 30 meq/L (ref 19–32)
Calcium: 8.7 mg/dL (ref 8.4–10.5)
GFR, Est Non African American: 73 mL/min
Glucose, Bld: 86 mg/dL (ref 70–99)
Potassium: 3.9 meq/L (ref 3.5–5.3)
Total Protein: 6.3 g/dL (ref 6.0–8.3)

## 2014-06-23 LAB — COMPLETE METABOLIC PANEL WITH GFR
ALT: 14 U/L (ref 0–35)
AST: 20 U/L (ref 0–37)
Albumin: 4 g/dL (ref 3.5–5.2)
Alkaline Phosphatase: 68 U/L (ref 39–117)
BUN: 11 mg/dL (ref 6–23)
Chloride: 105 mEq/L (ref 96–112)
Creat: 0.85 mg/dL (ref 0.50–1.10)
GFR, Est African American: 84 mL/min
Sodium: 140 mEq/L (ref 135–145)
Total Bilirubin: 0.5 mg/dL (ref 0.2–1.2)

## 2014-06-23 MED ORDER — AZITHROMYCIN 250 MG PO TABS: ORAL_TABLET | ORAL | Status: DC

## 2014-06-23 NOTE — Progress Notes (Signed)
 Chief Complaint:  Chief Complaint  Patient presents with  . Sore Throat    x2 days   . Fever    this morning  . Laryngitis    since saturday  . Wheezing  . fasting labs    lipids  . Urinary Tract Infection    was seen for procedure now thinks she has uti     HPI: Erika Johns is a 63 y.o. female who is here for  Vaginal discomfort:  1. Went back to [facility], she was struggling with urination problems, she was given a urodynamic study, she saw Dr McDiarmid on 06/13/2014. She was given Rapaflo 8 mg daily , adn she thinks that the catherter insertion to measure her urine flow caused her to have a yeast infection, she called in for a possible yeast infection and was given Fluconazole 100 mg daily x 3 days. She still has sxs of vaginal discomfort, she has no itchinees, odor or dysuria.   2. Since Saturday she has been sick with a URI and has tried flonase and gargling with salt water. Her thorat is raw. She took her temp this morning 100.2. She is having sinus pressure and congestion. She has been wheezing. She has not had temp this weekend.   3. The statin medicine she takes has run out . She wants to get her lipids rechecked. Last time it was done was over a year ago. She is complaint with meds , no SEs  4. 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 She take clonazepam and will see DR Carlota Raspberry for this , recently got a refill   Past Medical History  Diagnosis Date  . Allergy   . Arthritis   . Insomnia   . PONV (postoperative nausea and vomiting)     severe n/v-needs scop  . Constipation, chronic   . IBS (irritable bowel syndrome)   . Anxiety   . Depression    Past Surgical History  Procedure Laterality Date  . Knee arthroscopy  2003    lt  . Knee  arthroscopy      right  . Fracture surgery  1996    fx tib-fib-rt  . Hardware removal  1996    rt ankle  . Dilation and curettage of uterus  2006  . Tonsillectomy    . Colonoscopy    . Orif clavicular fracture Right 05/28/2013    Procedure: OPEN REDUCTION INTERNAL FIXATION (ORIF) CLAVICULAR FRACTURE;  Surgeon: Renette Butters, MD;  Location: [place];  Service: Orthopedics;  Laterality: Right;   History   Social History  . Marital Status: Married    Spouse Name: N/A    Number of Children: N/A  . Years of Education: N/A   Social History Main Topics  . Smoking status: Never Smoker   . Smokeless tobacco: None  . Alcohol Use: Yes  . Drug Use: No  . Sexual Activity: Yes   Other Topics Concern  . None   Social History Narrative   History reviewed. No pertinent family history. Allergies  Allergen Reactions  . Effexor [Venlafaxine Hydrochloride] Other (See Comments)    dizzy  . Hydrocodone Nausea And Vomiting  . Ampicillin Rash   Prior  to Admission medications   Medication Sig Start Date End Date Taking? Authorizing Provider  Ascorbic Acid (VITAMIN C) 1000 MG tablet Take 1,000 mg by mouth daily.   Yes Historical Provider, MD  atorvastatin (LIPITOR) 40 MG tablet Take 40 mg by mouth every other day.    Yes Historical Provider, MD  Cholecalciferol (VITAMIN D3) 1000 UNITS CAPS Take by mouth daily.   Yes Historical Provider, MD  clonazePAM (KLONOPIN) 0.5 MG tablet TAKE 1/2 TO 1 TABLET TWICE A DAY 05/19/14  Yes [name], MD  ibuprofen (ADVIL,MOTRIN) 800 MG tablet Take 1 tablet (800 mg total) by mouth 3 (three) times daily. 05/23/13  Yes Hannah Muthersbaugh, PA-C  meloxicam (MOBIC) 7.5 MG tablet Take 7.5 mg by mouth daily.   Yes Historical Provider, MD  sertraline (ZOLOFT) 100 MG tablet Take 1 tablet (100 mg total) by mouth daily. 03/17/14  Yes [name], MD  traMADol (ULTRAM) 50 MG tablet Take 1 tablet (50 mg total) by mouth every 8 (eight) hours as  needed for pain. 05/02/13  Yes Robyn Haber, MD  valACYclovir (VALTREX) 1000 MG tablet Take 2 tabs twice a day for 2 doses for flareups of Cold sores 12/07/12  Yes Darlyne Russian, MD  ciprofloxacin (CIPRO) 500 MG tablet Take 1 tablet (500 mg total) by mouth 2 (two) times daily. 04/10/14    P , DO  ezetimibe-simvastatin (VYTORIN) 10-20 MG per tablet Take 1 tablet by mouth every other day. 12/27/11   Darlyne Russian, MD  fluticasone (FLONASE) 50 MCG/ACT nasal spray Place 2 sprays into both nostrils daily. 01/08/14   [name], MD     ROS: The patient denies fevers, chills, night sweats, unintentional weight loss, chest pain, palpitations, wheezing, dyspnea on exertion, nausea, vomiting, abdominal pain, , hematuria, melena, numbness, weakness, or tingling.  All other systems have been reviewed and were otherwise negative with the exception of those mentioned in the HPI and as above.    PHYSICAL EXAM: Filed Vitals:   06/23/14 0818  BP: 116/72  Pulse: 77  Temp: 98.8 F (37.1 C)  Resp: 18   Filed Vitals:   06/23/14 0818  Height: 5' 10.5" (1.791 m)  Weight: 146 lb (66.225 kg)   Body mass index is 20.65 kg/(m^2).  General: Alert, no acute distress HEENT:  Normocephalic, atraumatic, oropharynx patent. EOMI, PERRLA. TM nl, erythematous throat, No exudates. + boggy nares, + sinus tenderness. Cardiovascular:  Regular rate and rhythm, no rubs murmurs or gallops.  Radial pulse intact. No pedal edema.  Respiratory: Clear to auscultation bilaterally.  No wheezes, rales, or rhonchi.  No cyanosis, no use of accessory musculature GI: No organomegaly, abdomen is soft and non-tender, positive bowel sounds.  No masses. Skin: No rashes. Neurologic: Facial musculature symmetric. Psychiatric: Patient is appropriate throughout our interaction. Affect is normal, she denies any SI/HI Lymphatic: No cervical lymphadenopathy Musculoskeletal: Gait intact. NO CVA tenderness   LABS: Results for orders  placed or performed in visit on 06/23/14  POCT urinalysis dipstick  Result Value Ref Range   Color, UA yellow    Clarity, UA hazy    Glucose, UA neg    Bilirubin, UA neg    Ketones, UA neg    Spec Grav, UA 1.010    Blood, UA neg    pH, UA 7.0    Protein, UA neg    Urobilinogen, UA 0.2    Nitrite, UA neg    ukocytes, UA small (1+)   POCT UA -  Microscopic Only  Result Value Ref Range   WBC, Ur, HPF, POC 2-4    RBC, urine, microscopic neg    Bacteria, U Microscopic neg    Mucus, UA neg    Epithelial cells, urine per micros 1-2    Crystals, Ur, HPF, POC neg    Casts, Ur, LPF, POC neg    Yeast, UA neg    Renal tubular cells    POCT Wet Prep with KOH  Result Value Ref Range   Trichomonas, UA Negative    Clue Cells Wet Prep HPF POC 1-5    Epithelial Wet Prep HPF POC 10-18    Yeast Wet Prep HPF POC negative    Bacteria Wet Prep HPF POC trace    RBC Wet Prep HPF POC 0-1    WBC Wet Prep HPF POC 0-2    KOH Prep POC Negative      EKG/XRAY:   Primary read interpreted by Dr. Marin Comment at [facility].   ASSESSMENT/PLAN: Encounter Diagnoses  Name Primary?  . Pain with urination Yes  . Hyperlipidemia   . Acute maxillary sinusitis, recurrence not specified   . Vaginal discomfort   . Acute pharyngitis, unspecified pharyngitis type    Labs pending Will cx urine Since her issues primarily is feelign crummy and sinuses and pharyngitis ( strong gag reflex so will not strep test)  will treat with azithromycin first. She has ahd urine cx before and had no growth, she ahs been txted for yeast infection and currently does not have one Will txt with z pack , otc nasacort F/u with Dr Carlota Raspberry for benzos prn   Gross sideeffects, risk and benefits, and alternatives of medications d/w patient. Patient is aware that all medications have potential sideeffects and we are unable to predict every sideeffect or drug-drug interaction that may occur.  , [place], DO 06/23/2014 10:07 AM

## 2014-06-23 NOTE — Patient Instructions (Signed)

## 2014-06-24 ENCOUNTER — Encounter: Payer: Self-pay | Admitting: Emergency Medicine

## 2014-06-24 ENCOUNTER — Other Ambulatory Visit: Payer: Self-pay

## 2014-06-24 LAB — URINE CULTURE
Colony Count: NO GROWTH
Organism ID, Bacteria: NO GROWTH

## 2014-06-24 MED ORDER — ATORVASTATIN CALCIUM 40 MG PO TABS: 40.0000 mg | ORAL_TABLET | ORAL | Status: DC

## 2014-06-24 NOTE — Telephone Encounter (Signed)
[facility] reqs RF of atorvastatin. Dr Marin Comment, you haven't written for this before, but pt's lipid test was normal 06/23/14 while being treated w/this. Do you want to OK RFs?

## 2014-06-26 NOTE — Telephone Encounter (Signed)
Dr Marin Comment has done this message.

## 2014-07-27 ENCOUNTER — Encounter: Payer: Self-pay | Admitting: Family Medicine

## 2014-08-04 ENCOUNTER — Ambulatory Visit (INDEPENDENT_AMBULATORY_CARE_PROVIDER_SITE_OTHER): Payer: BC Managed Care – PPO | Admitting: Family Medicine

## 2014-08-04 ENCOUNTER — Encounter: Payer: Self-pay | Admitting: Family Medicine

## 2014-08-04 VITALS — BP 112/71 | HR 59 | Temp 98.4°F | Resp 16 | Ht 70.25 in | Wt 147.0 lb

## 2014-08-04 DIAGNOSIS — F418 Other specified anxiety disorders: Secondary | ICD-10-CM

## 2014-08-04 DIAGNOSIS — G4700 Insomnia, unspecified: Secondary | ICD-10-CM

## 2014-08-04 DIAGNOSIS — M25562 Pain in left knee: Secondary | ICD-10-CM

## 2014-08-04 MED ORDER — SERTRALINE HCL 100 MG PO TABS: 100.0000 mg | ORAL_TABLET | Freq: Every day | ORAL | Status: DC

## 2014-08-04 MED ORDER — CLONAZEPAM 0.5 MG PO TABS: ORAL_TABLET | ORAL | Status: DC

## 2014-08-04 NOTE — Progress Notes (Signed)
Subjective:  This chart was scribed for Merri Ray, MD by Dellis Filbert, ED Scribe at [facility].The patient was seen in exam room 25 and the patient's care was started at 3:05 PM.   Patient ID: Erika Johns, female    DOB: 07-26-1951, 63 y.o.   MRN: [id number] Chief Complaint  Patient presents with  . Medication Refill    clonazepam and zoloft  . Depression  . Anxiety   HPI  HPI Comments: Erika Johns is a 63 y.o. female who presents to [facility] for a follow up of depression and anxiety. Pt is also here for a medication refill of Clonazepam and Zoloft. She was last seen on July 27th 2015. She was overall doing better with a counselor Vivia Budge) seen 3x times a week. Concerns at that time of her husbands illness and mothers impending death. She was given Klonopin 0.5 mg 1/2 BID and Zoloft 100 mg qd.   Pt is doing overall well. She is taking 1/2 a Klonopin in the morning and 1 Klonopin at night. For the last week she has been taking 1/2 [place] before bed because she is low on her Klonopin. She still has trouble sleeping due to waking up in the night. Pt is making progress meeting with Vivia Budge. She has had some set backs and has been concerned with her husband. This has been a stressor. She denies suicidal ideation and self harm. Pt also denies increased alcohol usage. Pt is dealing with work better. No new side effects with her Klonopin and Zoloft.  Left knee pain: Pt states her left knee flared up during a yoga class. She has some swelling and aching. Her knee does not lock up or give out. She ices and elevates her knees. Pt also takes ibuprofen occasionally. She takes a cortisone shot every year. She tore her meniscus in 2013. Followed by Dr. Noemi Chapel.   Patient Active Problem List   Diagnosis Date Noted  . KNEE PAIN 08/27/2009  . LUMBAGO 08/27/2009  . 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 HAND PAIN, LEFT 08/27/2009   Past Medical  History  Diagnosis Date  . Allergy   . Arthritis   . Insomnia   . PONV (postoperative nausea and vomiting)     severe n/v-needs scop  . Constipation, chronic   . IBS (irritable bowel syndrome)   . Anxiety   . Depression    Past Surgical History  Procedure Laterality Date  . Knee arthroscopy  2003    lt  . Knee arthroscopy      right  . Fracture surgery  1996    fx tib-fib-rt  . Hardware removal  1996    rt ankle  . Dilation and curettage of uterus  2006  . Tonsillectomy    . Colonoscopy    . Orif clavicular fracture Right 05/28/2013    Procedure: OPEN REDUCTION INTERNAL FIXATION (ORIF) CLAVICULAR FRACTURE;  Surgeon: Renette Butters, MD;  Location: [place];  Service: Orthopedics;  Laterality: Right;   Allergies  Allergen Reactions  . Effexor [Venlafaxine Hydrochloride] Other (See Comments)    dizzy  . Hydrocodone Nausea And Vomiting  . Ampicillin Rash   Prior to Admission medications   Medication Sig Start Date End Date Taking? Authorizing Provider  Ascorbic Acid (VITAMIN C) 1000 MG tablet Take 1,000 mg by mouth daily.   Yes Historical Provider, MD  atorvastatin (LIPITOR) 40 MG tablet Take 1 tablet (40 mg total) by  mouth every other day. 06/24/14  Yes Thao P Le, DO  Cholecalciferol (VITAMIN D3) 1000 UNITS CAPS Take by mouth daily.   Yes Historical Provider, MD  clonazePAM (KLONOPIN) 0.5 MG tablet TAKE 1/2 TO 1 TABLET TWICE A DAY as needed. 08/04/14  Yes [name], MD  diclofenac sodium (VOLTAREN) 1 % GEL Apply topically 4 (four) times daily.   Yes Historical Provider, MD  fluticasone (FLONASE) 50 MCG/ACT nasal spray Place 2 sprays into both nostrils daily. 01/08/14  Yes [name], MD  ibuprofen (ADVIL,MOTRIN) 800 MG tablet Take 1 tablet (800 mg total) by mouth 3 (three) times daily. 05/23/13  Yes Hannah Muthersbaugh, PA-C  meloxicam (MOBIC) 7.5 MG tablet Take 7.5 mg by mouth daily.   Yes Historical Provider, MD  Omega-3 Fatty Acids (FISH OIL)  1000 MG CAPS Take by mouth.   Yes Historical Provider, MD  Polyethylene Glycol 3350 (MIRALAX PO) Take by mouth.   Yes Historical Provider, MD  sertraline (ZOLOFT) 100 MG tablet Take 1 tablet (100 mg total) by mouth daily. 08/04/14  Yes [name], MD  silodosin (RAPAFLO) 8 MG CAPS capsule Take 8 mg by mouth daily with breakfast.   Yes Historical Provider, MD  traMADol (ULTRAM) 50 MG tablet Take 1 tablet (50 mg total) by mouth every 8 (eight) hours as needed for pain. 05/02/13  Yes Robyn Haber, MD  valACYclovir (VALTREX) 1000 MG tablet Take 2 tabs twice a day for 2 doses for flareups of Cold sores 12/07/12  Yes Darlyne Russian, MD  ezetimibe-simvastatin (VYTORIN) 10-20 MG per tablet Take 1 tablet by mouth every other day. Patient not taking: Reported on 08/04/2014 12/27/11   Darlyne Russian, MD   History   Social History  . Marital Status: Married    Spouse Name: N/A    Number of Children: N/A  . Years of Education: N/A   Occupational History  . Not on file.   Social History Main Topics  . Smoking status: Never Smoker   . Smokeless tobacco: Not on file  . Alcohol Use: Yes  . Drug Use: No  . Sexual Activity: Yes   Other Topics Concern  . Not on file   Social History Narrative   Review of Systems  Musculoskeletal: Positive for joint swelling.  Psychiatric/Behavioral: Positive for sleep disturbance. Negative for suicidal ideas and self-injury. The patient is nervous/anxious.       Objective:  BP 112/71 mmHg  Pulse 59  Temp(Src) 98.4 F (36.9 C) (Oral)  Resp 16  Ht 5' 10.25" (1.784 m)  Wt 147 lb (66.679 kg)  BMI 20.95 kg/m2  SpO2 97%  Physical Exam  Constitutional: She is oriented to person, place, and time. She appears well-developed and well-nourished. No distress.  HENT:  Head: Normocephalic and atraumatic.  Eyes: EOM are normal.  Neck: Normal range of motion.  Cardiovascular: Normal rate.   Pulmonary/Chest: Effort normal.  Musculoskeletal:  1+ effusion in left  knee, no focal tenderness. Full ROM of left knee. Negative McMurray's  Neurological: She is alert and oriented to person, place, and time.  Skin: Skin is warm and dry.  Psychiatric: She has a normal mood and affect. Her behavior is normal.  Nursing note and vitals reviewed.     Assessment & Plan:   Erika Johns is a 63 y.o. female Depression with anxiety - Plan: sertraline (ZOLOFT) 100 MG tablet, clonazePAM (KLONOPIN) 0.5 MG tablet,  Insomnia - Plan: sertraline (ZOLOFT) 100 MG tablet, clonazePAM (KLONOPIN)  0.5 MG tablet  - stable overall, except some stress with husband's health. Continued on same dose of Zoloft for now, klonopin if needed (ok to continue current use, but in next few months may need to look at zoloft dosing if persistent benzodiazepine need. Cont counseling.   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 Sx care, and if worsens - follow up with Dr. Noemi Chapel or RTC.    Meds ordered this encounter  Medications  . silodosin (RAPAFLO) 8 MG CAPS capsule    Sig: Take 8 mg by mouth daily with breakfast.  . diclofenac sodium (VOLTAREN) 1 % GEL    Sig: Apply topically 4 (four) times daily.  . Polyethylene Glycol 3350 (MIRALAX PO)    Sig: Take by mouth.  . Omega-3 Fatty Acids (FISH OIL) 1000 MG CAPS    Sig: Take by mouth.  . sertraline (ZOLOFT) 100 MG tablet    Sig: Take 1 tablet (100 mg total) by mouth daily.    Dispense:  90 tablet    Refill:  1  . clonazePAM (KLONOPIN) 0.5 MG tablet    Sig: TAKE 1/2 TO 1 TABLET TWICE A DAY as needed.    Dispense:  45 tablet    Refill:  1   Patient Instructions  Plan on recheck in 3-6 months, call for med refills until that time.  Keep counseling appointments, and let me know if anything changes.  If knee starts to bother you more - let me or Dr. Noemi Chapel know.     I personally performed the services described in this documentation, which was scribed in my presence. The recorded information has been reviewed and considered,  and addended by me as needed.

## 2014-08-04 NOTE — Patient Instructions (Signed)
Plan on recheck in 3-6 months, call for med refills until that time.  Keep counseling appointments, and let me know if anything changes.  If knee starts to bother you more - let me or Dr. Noemi Chapel know.

## 2014-09-14 ENCOUNTER — Encounter: Payer: Self-pay | Admitting: Family Medicine

## 2014-09-26 IMAGING — RF DG UGI W/ SMALL BOWEL HIGH DENSITY
17 of 24 series · 17 of 24 positions shown · non-contrast
Comparison: None

FLUOROSCOPY TIME:  2 min and 36 seconds

CLINICAL DATA: Abdominal pain.  Chronic constipation.

EXAM:
UPPER GI W/ SMALL BOWEL HIGH DENSITY
TECHNIQUE: After obtaining a scout radiograph a routine upper GI series was
performed using thin and high density barium.

[Series 1: run · 1 of 1 slices shown (1 of 15)]
[im 1/1]
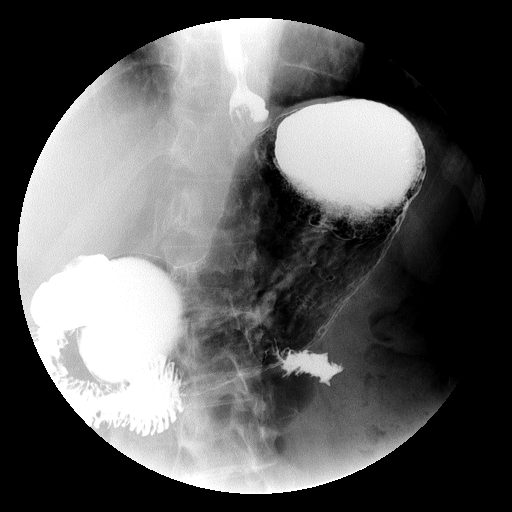

[Series 3: run · 1 of 1 slices shown (2 of 15)]
[im 1/1]
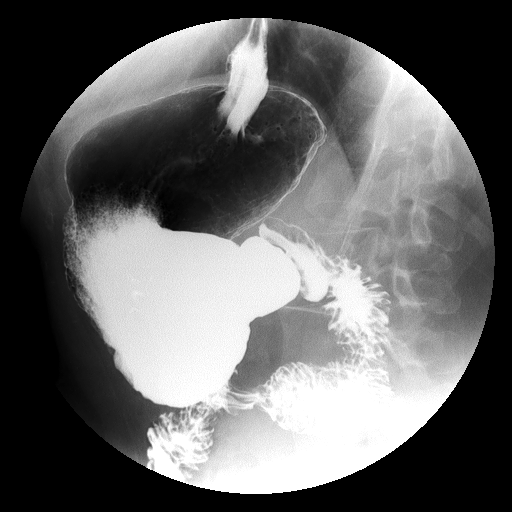

[Series 4: run · 1 of 1 slices shown (3 of 15)]
[im 1/1]
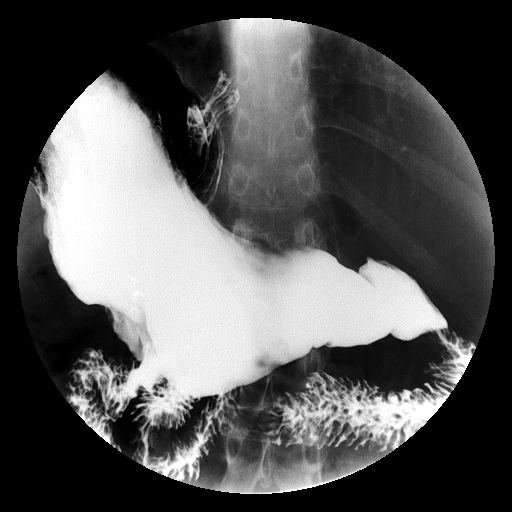

[Series 5: run · 1 of 1 slices shown (4 of 15)]
[im 1/1]
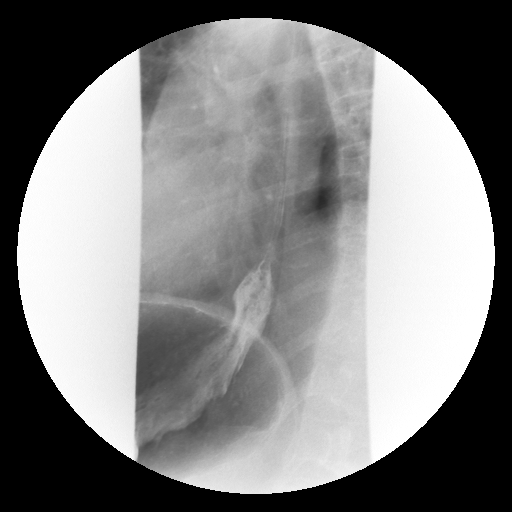

[Series 7: run · 1 of 1 slices shown (5 of 15)]
[im 1/1]
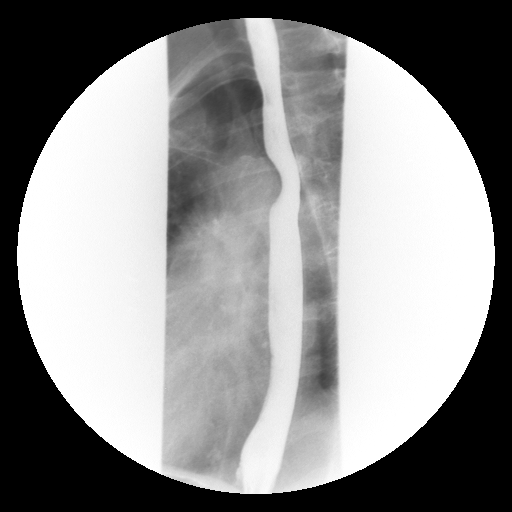

[Series 8: run · 1 of 1 slices shown (6 of 15)]
[im 1/1]
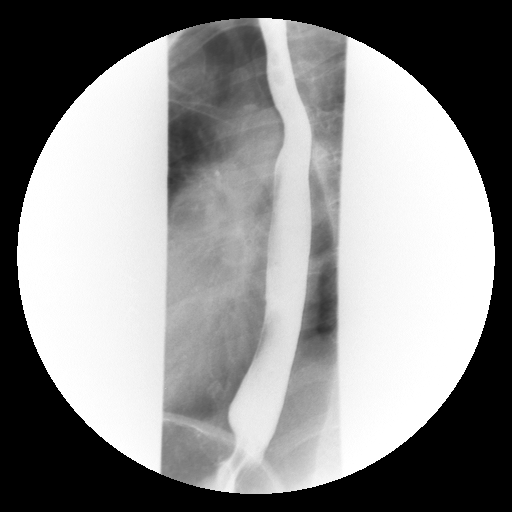

[Series 10: run · 1 of 1 slices shown (7 of 15)]
[im 1/1]
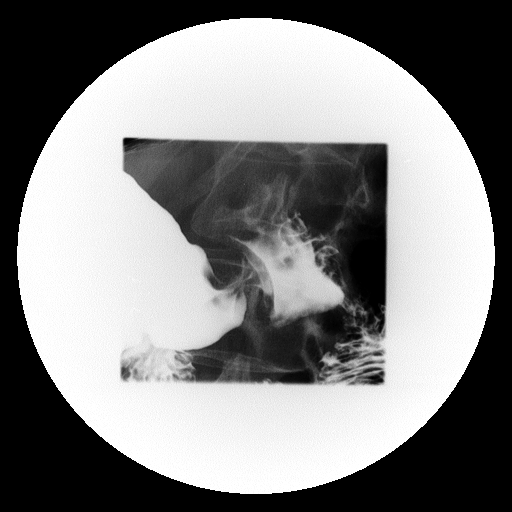

[Series 11: run · 1 of 1 slices shown (8 of 15)]
[im 1/1]
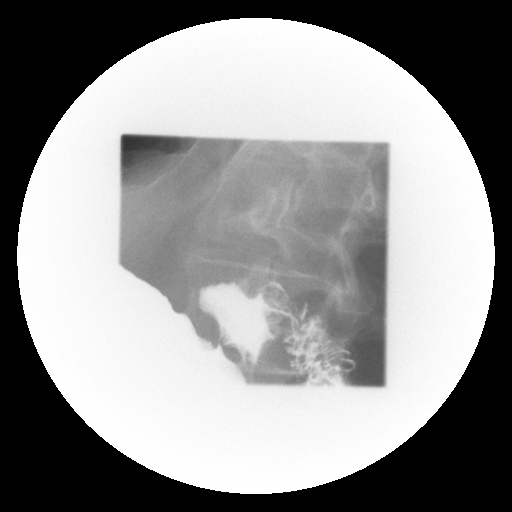

[Series 13: run · 1 of 1 slices shown (9 of 15)]
[im 1/1]
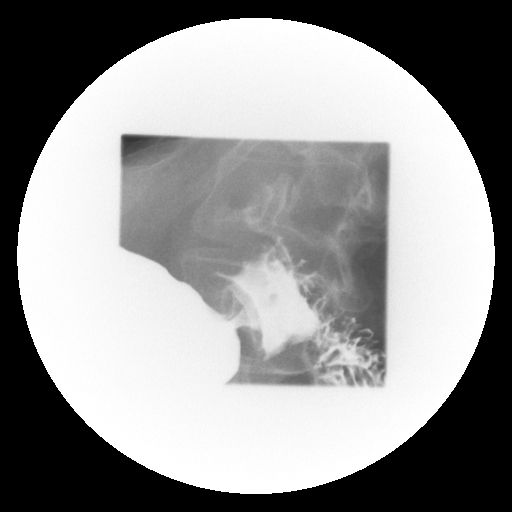

[Series 14: run · 1 of 1 slices shown (10 of 15)]
[im 1/1]
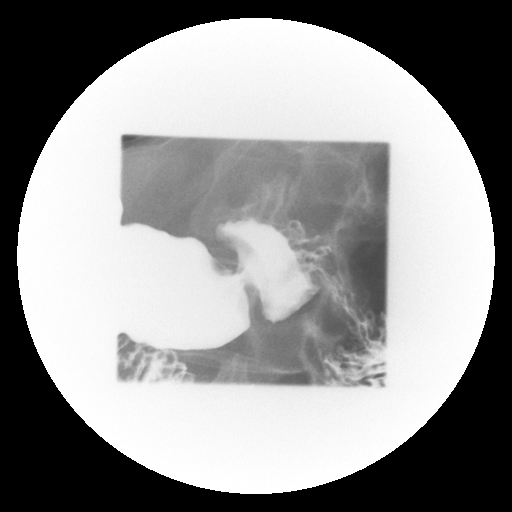

[Series 15: run · 1 of 1 slices shown (11 of 15)]
[im 1/1]
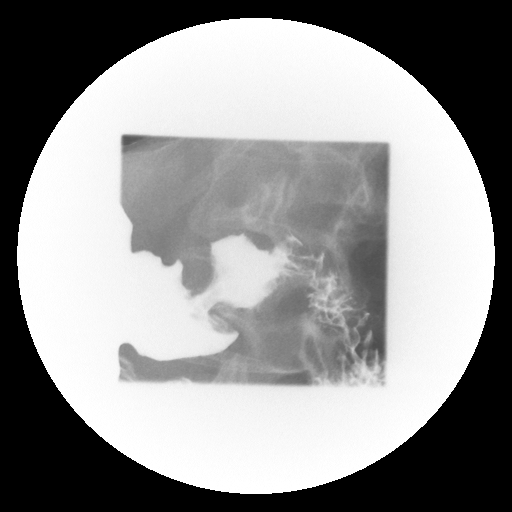

[Series 17: run · 1 of 1 slices shown (12 of 15)]
[im 1/1]
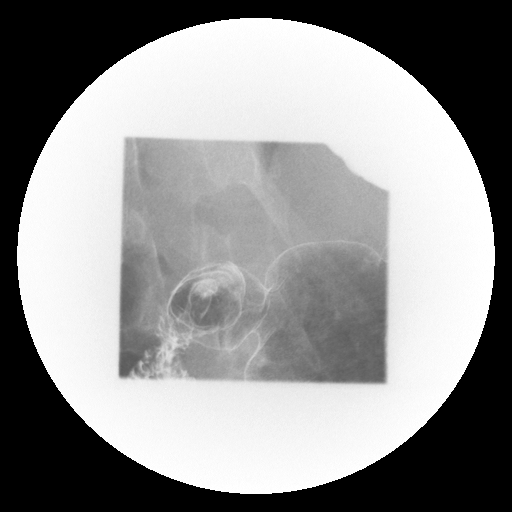

[Series 18: run · 1 of 1 slices shown (13 of 15)]
[im 1/1]
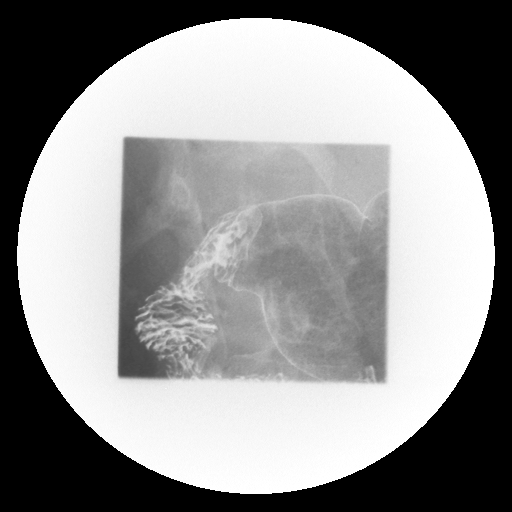

[Series 20: run · 1 of 1 slices shown (14 of 15)]
[im 1/1]
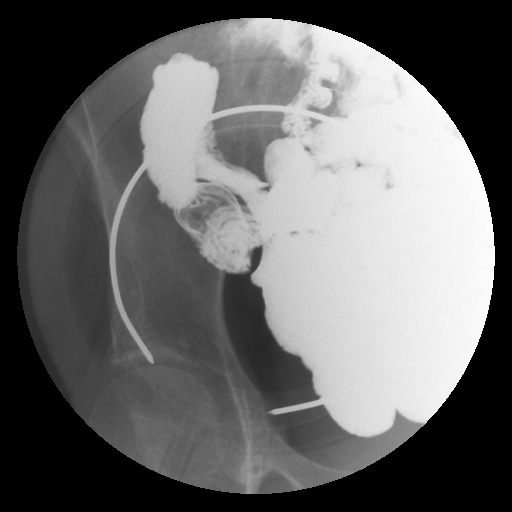

[Series 21: run · 1 of 1 slices shown (15 of 15)]
[im 1/1]
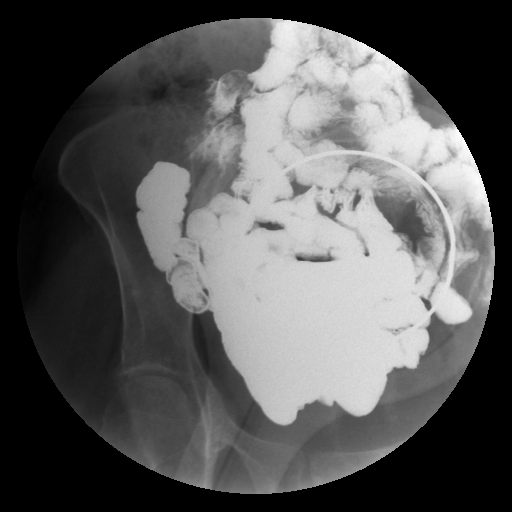

[Series 1001: view not recorded · 0.20mm/px · 1 of 1 slices shown (1 of 2)]
[im 1/1]
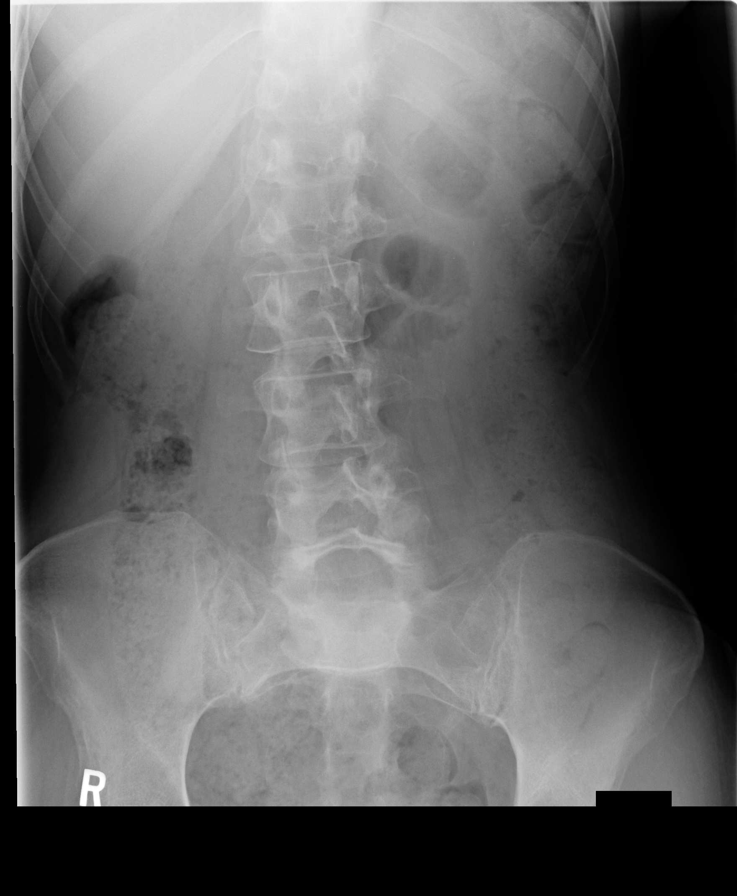

[Series 1003: view not recorded · 0.20mm/px · 1 of 1 slices shown (2 of 2)]
[im 1/1]
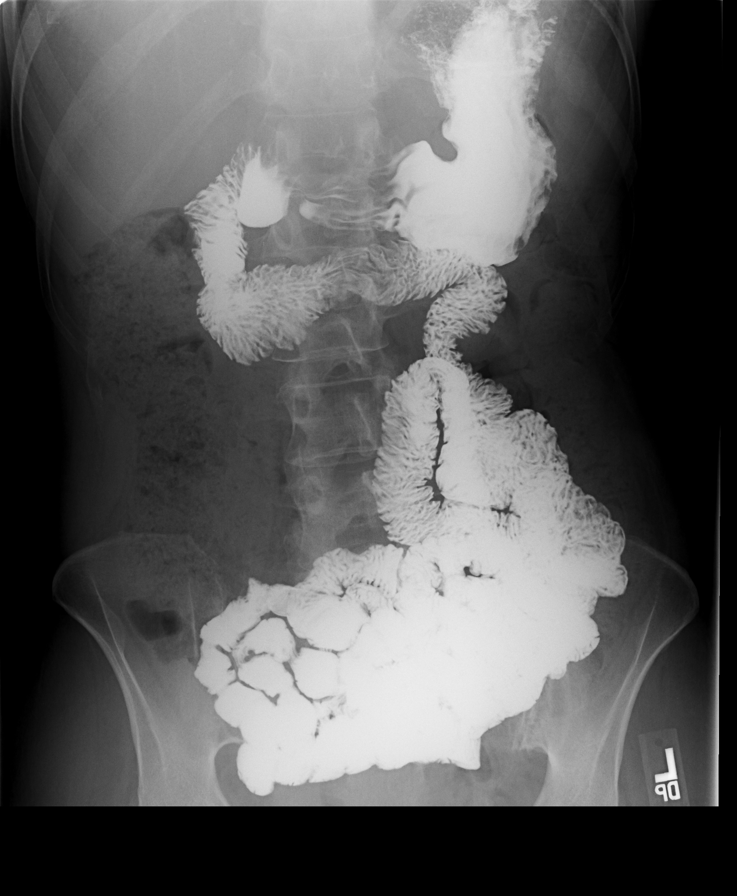

[17 of 24 positions shown; findings below may reference images not displayed]

FINDINGS: Preprocedure scout film demonstrates a moderate amount of colonic
stool. Minimal convex right lumbar spine curvature.

Double contrast evaluation of the stomach demonstrates no mass or
ulcer. Normal appearance of the duodenal bulb and C-loop.

Evaluation of esophageal primary peristalsis demonstrates minimal
esophageal dysmotility, likely age-appropriate.

Full column evaluation of the esophagus demonstrates no persistent
narrowing or stricture.

Small bowel follow-through images demonstrate normal mucosal fold
pattern, without evidence of stricture, obstruction, or dilatation.
Normal terminal ileum.
IMPRESSION: Normal upper GI/ small bowel follow-through for age.

Large colonic stool burden on plain film.

## 2014-10-11 ENCOUNTER — Other Ambulatory Visit: Payer: Self-pay | Admitting: Family Medicine

## 2014-10-13 ENCOUNTER — Encounter: Payer: Self-pay | Admitting: Family Medicine

## 2014-10-14 NOTE — Telephone Encounter (Signed)
Refilled. Ready for pickup.  

## 2014-10-15 NOTE — Telephone Encounter (Signed)
Faxed

## 2014-12-23 ENCOUNTER — Encounter: Payer: Self-pay | Admitting: Family Medicine

## 2014-12-23 ENCOUNTER — Other Ambulatory Visit: Payer: Self-pay | Admitting: Family Medicine

## 2014-12-25 NOTE — Telephone Encounter (Signed)
Has appt may 23rd. Refilled.

## 2014-12-25 NOTE — Telephone Encounter (Signed)
Called in rx and notified pt via voicemail.

## 2014-12-29 NOTE — Telephone Encounter (Signed)
Sent in #45 on 12/25/14. Has appt later this month to discuss dosing.

## 2015-01-12 ENCOUNTER — Ambulatory Visit (INDEPENDENT_AMBULATORY_CARE_PROVIDER_SITE_OTHER): Payer: BLUE CROSS/BLUE SHIELD | Admitting: Family Medicine

## 2015-01-12 ENCOUNTER — Encounter: Payer: Self-pay | Admitting: Family Medicine

## 2015-01-12 VITALS — BP 117/53 | HR 64 | Temp 98.1°F | Resp 16 | Ht 70.0 in | Wt 150.4 lb

## 2015-01-12 DIAGNOSIS — Z23 Encounter for immunization: Secondary | ICD-10-CM

## 2015-01-12 DIAGNOSIS — M25562 Pain in left knee: Secondary | ICD-10-CM | POA: Diagnosis not present

## 2015-01-12 DIAGNOSIS — F418 Other specified anxiety disorders: Secondary | ICD-10-CM

## 2015-01-12 MED ORDER — CLONAZEPAM 0.5 MG PO TABS: 0.2500 mg | ORAL_TABLET | Freq: Two times a day (BID) | ORAL | Status: DC | PRN

## 2015-01-12 MED ORDER — TRAMADOL HCL 50 MG PO TABS: 50.0000 mg | ORAL_TABLET | Freq: Three times a day (TID) | ORAL | Status: DC | PRN

## 2015-01-12 NOTE — Progress Notes (Signed)
Subjective:  This chart was scribed for Merri Ray, MD by [facility], medical scribe at [facility].The patient was seen in exam room 25 and the patient's care was started at 1:07 PM.   Patient ID: Erika Johns, female    DOB: [birth year], 64 y.o.   MRN: [id number]  Chief Complaint  Patient presents with  . Anxiety  . Medication Refill    clonazepam and tramadol   HPI HPI Comments: Erika Johns is a 64 y.o. female who presents to [facility] for a follow up for anxiety. She also needs her clonazepam and tramadol refilled. Pt was last seen on 07/25/2014  Depression with anxiety: She was on 1/2 klonopin a day and Zoloft 100 mg daily. At that time did recommended to continue counseling. She was having situational anxiety due to her husbands health at that time. Last refilled her klonopin for 44 on May 5 th, per prior e-mail her planned on retiring in July. This past month she is doing very well. May 31 st is her last day of work. Still taking her Klonopin every night for sleep and occasionally take a half for work. She has tried stopping it but was unable to sleep and noticed she was anxious at work. Yoga and exercise has been very helpful. She sees her counselor once every three weeks.  Left knee pain:  Followed by Dr. Noemi Chapel. 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 Discussed at December visit if symptoms persisted to follow up with Dr. Noemi Chapel. She takes Tramadol when her knees hurt after strenuous exercise. Last prescription lasted her three years. She is also taking glucosamine chondroitin and Voltaren gel for relief.  Patient Active Problem List   Diagnosis Date Noted  . KNEE PAIN 08/27/2009  . LUMBAGO 08/27/2009  . 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 HAND PAIN, LEFT 08/27/2009   Past Medical History  Diagnosis Date  . Allergy   . Arthritis   .  Insomnia   . PONV (postoperative nausea and vomiting)     severe n/v-needs scop  . Constipation, chronic   . IBS (irritable bowel syndrome)   . Anxiety   . Depression    Past Surgical History  Procedure Laterality Date  . Knee arthroscopy  2003    lt  . Knee arthroscopy      right  . Fracture surgery  1996    fx [id number]-rt  . Hardware removal  1996    rt ankle  . Dilation and curettage of uterus  2006  . Tonsillectomy    . Colonoscopy    . Orif clavicular fracture Right 05/28/2013    Procedure: OPEN REDUCTION INTERNAL FIXATION (ORIF) CLAVICULAR FRACTURE;  Surgeon: Renette Butters, MD;  Location: [place];  Service: Orthopedics;  Laterality: Right;   Allergies  Allergen Reactions  . Effexor [Venlafaxine Hydrochloride] Other (See Comments)    dizzy  . Hydrocodone Nausea And Vomiting  . Ampicillin Rash   Prior to Admission medications   Medication Sig Start Date End Date Taking? Authorizing Provider  Ascorbic Acid (VITAMIN C) 1000 MG tablet Take 1,000 mg by mouth daily.    Historical Provider, MD  atorvastatin (LIPITOR) 40 MG tablet Take 1 tablet (40 mg total) by mouth every other day. 06/24/14   Thao P Le, DO  Cholecalciferol (VITAMIN D3) 1000 UNITS CAPS Take by mouth daily.  Historical Provider, MD  clonazePAM (KLONOPIN) 0.5 MG tablet TAKE 1/2 TO 1 TABLET TWICE DAILY AS NEEDED 12/25/14   [name], MD  diclofenac sodium (VOLTAREN) 1 % GEL Apply topically 4 (four) times daily.    Historical Provider, MD  ezetimibe-simvastatin (VYTORIN) 10-20 MG per tablet Take 1 tablet by mouth every other day. Patient not taking: Reported on 08/04/2014 12/27/11   Darlyne Russian, MD  fluticasone [facility]) 50 MCG/ACT nasal spray Place 2 sprays into both nostrils daily. 01/08/14   [name], MD  ibuprofen (ADVIL,MOTRIN) 800 MG tablet Take 1 tablet (800 mg total) by mouth 3 (three) times daily. 05/23/13   Hannah Muthersbaugh, PA-C  meloxicam (MOBIC) 7.5 MG tablet Take 7.5  mg by mouth daily.    Historical Provider, MD  Omega-3 Fatty Acids (FISH OIL) 1000 MG CAPS Take by mouth.    Historical Provider, MD  Polyethylene Glycol 3350 (MIRALAX PO) Take by mouth.    Historical Provider, MD  sertraline (ZOLOFT) 100 MG tablet Take 1 tablet (100 mg total) by mouth daily. 08/04/14   [name], MD  silodosin (RAPAFLO) 8 MG CAPS capsule Take 8 mg by mouth daily with breakfast.    Historical Provider, MD  traMADol (ULTRAM) 50 MG tablet Take 1 tablet (50 mg total) by mouth every 8 (eight) hours as needed for pain. 05/02/13   Robyn Haber, MD  valACYclovir (VALTREX) 1000 MG tablet Take 2 tabs twice a day for 2 doses for flareups of Cold sores 12/07/12   Darlyne Russian, MD   History   Social History  . Marital Status: Married    Spouse Name: N/A  . Number of Children: N/A  . Years of Education: N/A   Occupational History  . Not on file.   Social History Main Topics  . Smoking status: Never Smoker   . Smokeless tobacco: Not on file  . Alcohol Use: Yes  . Drug Use: No  . Sexual Activity: Yes   Other Topics Concern  . Not on file   Social History Narrative    Review of Systems  Musculoskeletal: Positive for arthralgias.  Psychiatric/Behavioral: The patient is nervous/anxious.       Objective:  BP 117/53 mmHg  Pulse 64  Temp(Src) 98.1 F (36.7 C) (Oral)  Resp 16  Ht 5\' 10"  (1.778 m)  Wt 150 lb 6.4 oz (68.221 kg)  BMI 21.58 kg/m2  SpO2 97% Physical Exam  Constitutional: She is oriented to person, place, and time. She appears well-developed and well-nourished. No distress.  HENT:  Head: Normocephalic and atraumatic.  Eyes: Pupils are equal, round, and reactive to light.  Neck: Normal range of motion.  Cardiovascular: Normal rate and regular rhythm.   Pulmonary/Chest: Effort normal. No respiratory distress.  Musculoskeletal: Normal range of motion.  Trace effusion left knee, full range of motion, no bony tenderness and slight crepitous.     Neurological: She is alert and oriented to person, place, and time.  Skin: Skin is warm and dry.  Psychiatric: She has a normal mood and affect. Her behavior is normal.  Nursing note and vitals reviewed.     Assessment & Plan:   CHAQUITA BASQUES is a 64 y.o. female Left knee pain - Plan: traMADol (ULTRAM) 50 MG tablet  -OA/DJD with rare/intermmitent flare. Discussed topical voltaren if needed as initial treatment, but if rere need of ultram - provided today.  Risks with zoloft (seizure threshold and serotonin syndrome ) discussed. If persistent pain -  follow up with ortho.   Depression with anxiety - Plan: clonazePAM (KLONOPIN) 0.5 MG tablet  - overall stable, but upcoming retirement. Discussed maintaining same dose of Zoloft for now d/t thi life change and will determine need for klonopin over next few months to help determine next step. Anticipate less anxiety after retirement.   Need for prophylactic vaccination against Streptococcus pneumoniae (pneumococcus) - Plan: Pneumococcal conjugate vaccine 13-valent IM  - prevnar given., plan on pneumovax at next visit.   Meds ordered this encounter  Medications  . clonazePAM (KLONOPIN) 0.5 MG tablet    Sig: Take 0.5-1 tablets (0.25-0.5 mg total) by mouth 2 (two) times daily as needed for anxiety.    Dispense:  45 tablet    Refill:  1    May fill after June 4th 2016.  . traMADol (ULTRAM) 50 MG tablet    Sig: Take 1 tablet (50 mg total) by mouth every 8 (eight) hours as needed.    Dispense:  30 tablet    Refill:  0  . glucosamine-chondroitin 500-400 MG tablet    Sig: Take 1 tablet by mouth. 2 gel caps at breakfast   Patient Instructions  You can increase the topical voltaren gel to few times per day - especially if knee flares, and tramadol only if needed for more severe pain (this can also interact with Zoloft, so rare doses only if needed), but if worsened pain - follow up with Dr. Noemi Chapel   I anticipate less need of Klonopin  in retirement.  Follow up with me in 3 months to discuss need for this medicine and to determine if med changes needed. Continue Zoloft at same dose for now, and still recommend to continue counseling.   Can give you 2nd pneumonia vaccine at follow up in 3 months.   Return to the clinic or go to the nearest emergency room if any of your symptoms worsen or new symptoms occur.     I personally performed the services described in this documentation, which was scribed in my presence. The recorded information has been reviewed and considered, and addended by me as needed.

## 2015-01-12 NOTE — Patient Instructions (Addendum)
You can increase the topical voltaren gel to few times per day - especially if knee flares, and tramadol only if needed for more severe pain (this can also interact with Zoloft, so rare doses only if needed), but if worsened pain - follow up with Dr. Noemi Chapel   I anticipate less need of Klonopin in retirement.  Follow up with me in 3 months to discuss need for this medicine and to determine if med changes needed. Continue Zoloft at same dose for now, and still recommend to continue counseling.   Can give you 2nd pneumonia vaccine at follow up in 3 months.   Return to the clinic or go to the nearest emergency room if any of your symptoms worsen or new symptoms occur.

## 2015-02-02 ENCOUNTER — Encounter: Payer: Self-pay | Admitting: *Deleted

## 2015-03-02 ENCOUNTER — Other Ambulatory Visit: Payer: Self-pay | Admitting: Family Medicine

## 2015-05-26 ENCOUNTER — Encounter: Payer: Self-pay | Admitting: Family Medicine

## 2015-05-26 ENCOUNTER — Encounter: Payer: Self-pay | Admitting: Emergency Medicine

## 2015-05-31 ENCOUNTER — Ambulatory Visit (INDEPENDENT_AMBULATORY_CARE_PROVIDER_SITE_OTHER): Payer: PPO | Admitting: Family Medicine

## 2015-05-31 VITALS — BP 122/68 | HR 60 | Temp 98.1°F | Resp 16 | Ht 71.0 in | Wt 145.0 lb

## 2015-05-31 DIAGNOSIS — Z23 Encounter for immunization: Secondary | ICD-10-CM

## 2015-05-31 DIAGNOSIS — F418 Other specified anxiety disorders: Secondary | ICD-10-CM

## 2015-05-31 DIAGNOSIS — F4323 Adjustment disorder with mixed anxiety and depressed mood: Secondary | ICD-10-CM | POA: Diagnosis not present

## 2015-05-31 DIAGNOSIS — B353 Tinea pedis: Secondary | ICD-10-CM

## 2015-05-31 DIAGNOSIS — E785 Hyperlipidemia, unspecified: Secondary | ICD-10-CM

## 2015-05-31 LAB — COMPLETE METABOLIC PANEL WITH GFR
ALT: 13 U/L (ref 6–29)
AST: 15 U/L (ref 10–35)
Albumin: 4.1 g/dL (ref 3.6–5.1)
Alkaline Phosphatase: 59 U/L (ref 33–130)
BUN: 22 mg/dL (ref 7–25)
CO2: 31 mmol/L (ref 20–31)
Calcium: 9 mg/dL (ref 8.6–10.4)
Chloride: 104 mmol/L (ref 98–110)
Creat: 0.81 mg/dL (ref 0.50–0.99)
GFR, Est African American: 88 mL/min (ref >=60)
GFR, Est Non African American: 76 mL/min (ref >=60)
Glucose, Bld: 82 mg/dL (ref 65–99)
Potassium: 4.1 mmol/L (ref 3.5–5.3)
Sodium: 141 mmol/L (ref 135–146)
Total Bilirubin: 0.6 mg/dL (ref 0.2–1.2)
Total Protein: 6.5 g/dL (ref 6.1–8.1)

## 2015-05-31 LAB — LIPID PANEL
Cholesterol: 162 mg/dL (ref 125–200)
HDL: 71 mg/dL (ref >=46)
LDL Cholesterol: 76 mg/dL (ref <130)
Total CHOL/HDL Ratio: 2.3 Ratio (ref <=5.0)
Triglycerides: 73 mg/dL (ref <150)
VLDL: 15 mg/dL (ref <30)

## 2015-05-31 MED ORDER — ATORVASTATIN CALCIUM 40 MG PO TABS: 40.0000 mg | ORAL_TABLET | ORAL | Status: DC

## 2015-05-31 MED ORDER — NAFTIFINE HCL 1 % EX CREA: TOPICAL_CREAM | Freq: Every day | CUTANEOUS | Status: DC

## 2015-05-31 MED ORDER — CLONAZEPAM 0.5 MG PO TABS: 0.2500 mg | ORAL_TABLET | Freq: Two times a day (BID) | ORAL | Status: DC | PRN

## 2015-05-31 MED ORDER — SERTRALINE HCL 100 MG PO TABS
ORAL_TABLET | ORAL | Status: DC
Start: 2015-05-31 — End: 2015-08-11

## 2015-05-31 NOTE — Progress Notes (Signed)
@UMFCLOGO @  This chart was scribed for Robyn Haber, MD by Thea Alken, ED Scribe. This patient was seen in room 1 and the patient's care was started at 8:34 AM.  Patient ID: Erika Johns MRN: [id number], DOB: 14-Mar-1951, 64 y.o. Date of Encounter: 05/31/2015, 8:10 AM  Primary Physician: Jenny Reichmann, MD  Chief Complaint:  Chief Complaint  Patient presents with   Immunizations    pnemonia booster   Medication Refill    Clonazepam .05, Naftin 60g cream   other    needs labwork done     HPI: 64 y.o. year old female with history below presents with Medication refill. She is usually seen by Dr. Carlota Raspberry but he is currently on vacation. Last seen by him 12/2014  Pt is fasting and is needing blood work.   Hyperlipidemia  Takes Lipitor 40mg , 1 pill every other night. She has 1 refill left on medication.   Depression Pt is needing a refill of klonopin .5mg . She has been taking Klonopin for 2 years for depression. She has been caring for her husband for a couple years. She was initially taking a half during the day and a half at night. She has cut back and is now only taking a half a night.  Pt states she has been doing well and has been taking care of herself as well. She has been exercising regularly. She has tried group counseling session for caregivers.  She does not need a refill of Zoloft.  Husband has end-stage myelofibrosis. This is been a tremendous stress. 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  Foot fungus Pt has a chronic foot fungus that worsens in the winter. She would like a refill of Naftin 1%.   Immunization Immunization History  Administered Date(s) Administered   Influenza Split 04/28/2012, 05/22/2013, 06/05/2014   Pneumococcal Conjugate-13 01/12/2015   Td 11/22/2007   Zoster 06/22/2010   She received her flu shot at a local [facility]. She would like Prevnar today.    Past Medical History  Diagnosis Date   Allergy     Arthritis    Insomnia    PONV (postoperative nausea and vomiting)     severe n/v-needs scop   Constipation, chronic    IBS (irritable bowel syndrome)    Anxiety    Depression      Home Meds: Prior to Admission medications   Medication Sig Start Date End Date Taking? Authorizing Provider  Ascorbic Acid (VITAMIN C) 1000 MG tablet Take 1,000 mg by mouth daily.    Historical Provider, MD  atorvastatin (LIPITOR) 40 MG tablet Take 1 tablet (40 mg total) by mouth every other day. 06/24/14   Thao P Le, DO  Cholecalciferol (VITAMIN D3) 1000 UNITS CAPS Take by mouth daily.    Historical Provider, MD  clonazePAM (KLONOPIN) 0.5 MG tablet Take 0.5-1 tablets (0.25-0.5 mg total) by mouth 2 (two) times daily as needed for anxiety. 01/12/15   [name], MD  diclofenac sodium (VOLTAREN) 1 % GEL Apply topically 4 (four) times daily.    Historical Provider, MD  ezetimibe-simvastatin (VYTORIN) 10-20 MG per tablet Take 1 tablet by mouth every other day. Patient not taking: Reported on 08/04/2014 12/27/11   Darlyne Russian, MD  fluticasone [facility]) 50 MCG/ACT nasal spray Place 2 sprays into both nostrils daily. 01/08/14   [name], MD  glucosamine-chondroitin 500-400 MG tablet Take 1 tablet by mouth. 2 gel caps at breakfast    Historical  Provider, MD  ibuprofen (ADVIL,MOTRIN) 800 MG tablet Take 1 tablet (800 mg total) by mouth 3 (three) times daily. 05/23/13   Hannah Muthersbaugh, PA-C  meloxicam (MOBIC) 7.5 MG tablet Take 7.5 mg by mouth daily.    Historical Provider, MD  Omega-3 Fatty Acids (FISH OIL) 1000 MG CAPS Take by mouth.    Historical Provider, MD  Polyethylene Glycol 3350 (MIRALAX PO) Take by mouth.    Historical Provider, MD  sertraline (ZOLOFT) 100 MG tablet TAKE 1 TABLET (100 MG TOTAL) BY MOUTH DAILY. 03/03/15   Ezekiel Slocumb, PA-C  silodosin (RAPAFLO) 8 MG CAPS capsule Take 8 mg by mouth daily with breakfast.    Historical Provider, MD  traMADol (ULTRAM) 50 MG tablet Take 1  tablet (50 mg total) by mouth every 8 (eight) hours as needed. 01/12/15   [name], MD  valACYclovir (VALTREX) 1000 MG tablet Take 2 tabs twice a day for 2 doses for flareups of Cold sores 12/07/12   Darlyne Russian, MD    Allergies:  Allergies  Allergen Reactions   Effexor [Venlafaxine Hydrochloride] Other (See Comments)    dizzy   Hydrocodone Nausea And Vomiting   Ampicillin Rash    Social History   Social History   Marital Status: Married    Spouse Name: N/A   Number of Children: N/A   Years of Education: N/A   Occupational History   Not on file.   Social History Main Topics   Smoking status: Never Smoker    Smokeless tobacco: Not on file   Alcohol Use: Yes   Drug Use: No   Sexual Activity: Yes   Other Topics Concern   Not on file   Social History Narrative     Review of Systems: Constitutional: negative for chills, fever, night sweats, weight changes, or fatigue  HEENT: negative for vision changes, hearing loss, congestion, rhinorrhea, ST, epistaxis, or sinus pressure Cardiovascular: negative for chest pain or palpitations Respiratory: negative for hemoptysis, wheezing, shortness of breath, or cough Abdominal: negative for abdominal pain, nausea, vomiting, diarrhea, or constipation Dermatological: negative for rash Neurologic: negative for headache, dizziness, or syncope All other systems reviewed and are otherwise negative with the exception to those above and in the HPI.   Physical Exam: Blood pressure 122/68, pulse 60, temperature 98.1 F (36.7 C), temperature source Oral, resp. rate 16, height 5\' 11"  (1.803 m), weight 145 lb (65.772 kg), SpO2 98 %., Body mass index is 20.23 kg/(m^2). General: Well developed, well nourished, in no acute distress. Head: Normocephalic, atraumatic, eyes without discharge, sclera non-icteric, nares are without discharge. Bilateral auditory canals clear, TM's are without perforation, pearly grey and translucent  with reflective cone of light bilaterally. Oral cavity moist, posterior pharynx without exudate, erythema, peritonsillar abscess, or post nasal drip.  Neck: Supple. No thyromegaly. Full ROM. No lymphadenopathy. Lungs: Clear bilaterally to auscultation without wheezes, rales, or rhonchi. Breathing is unlabored. Heart: RRR with S1 S2. No murmurs, rubs, or gallops appreciated. Abdomen: Soft, non-tender, non-distended with normoactive bowel sounds. No hepatomegaly. No rebound/guarding. No obvious abdominal masses. Msk:  Strength and tone normal for age. Extremities/Skin: Warm and dry. No clubbing or cyanosis. No edema. No rashes or suspicious lesions. Neuro: Alert and oriented X 3. Moves all extremities spontaneously. Gait is normal. CNII-XII grossly in tact. Psych:  Responds to questions appropriately with a normal affect.    ASSESSMENT AND PLAN:  64 y.o. year old female with hyperlipidemia, need for refills on medication, need for pneumonia  vaccine This chart was scribed in my presence and reviewed by me personally.    ICD-9-CM ICD-10-CM   1. Hyperlipidemia 272.4 E78.5 Lipid panel     COMPLETE METABOLIC PANEL WITH GFR  2. Adjustment disorder with mixed anxiety and depressed mood 309.28 F43.23 atorvastatin (LIPITOR) 40 MG tablet  3. Depression with anxiety 300.4 F41.8 clonazePAM (KLONOPIN) 0.5 MG tablet     sertraline (ZOLOFT) 100 MG tablet  4. Tinea pedis, unspecified laterality 110.4 B35.3 naftifine (NAFTIN) 1 % cream  5. Need for prophylactic vaccination against Streptococcus pneumoniae (pneumococcus) V03.82 Z23 Pneumococcal conjugate vaccine 13-valent IM     CANCELED: Pneumococcal polysaccharide vaccine 23-valent greater than or equal to 2yo subcutaneous/IM     CANCELED: Pneumococcal polysaccharide vaccine 23-valent greater than or equal to 2yo subcutaneous/IM     Signed, Robyn Haber, MD   By signing my name below, I, Raven Small, attest that this documentation has been prepared  under the direction and in the presence of Robyn Haber, MD.  Electronically Signed: Thea Alken, ED Scribe. 05/31/2015. 8:56 AM.  Signed, Robyn Haber, MD 05/31/2015 8:10 AM

## 2015-06-12 ENCOUNTER — Encounter: Payer: Self-pay | Admitting: Family Medicine

## 2015-07-08 LAB — HM DEXA SCAN

## 2015-08-11 ENCOUNTER — Other Ambulatory Visit: Payer: Self-pay | Admitting: Physician Assistant

## 2015-09-24 DIAGNOSIS — M170 Bilateral primary osteoarthritis of knee: Secondary | ICD-10-CM | POA: Diagnosis not present

## 2015-10-14 ENCOUNTER — Ambulatory Visit (INDEPENDENT_AMBULATORY_CARE_PROVIDER_SITE_OTHER): Payer: PPO | Admitting: Family Medicine

## 2015-10-14 ENCOUNTER — Encounter: Payer: Self-pay | Admitting: Family Medicine

## 2015-10-14 VITALS — BP 98/72 | HR 59 | Temp 98.4°F | Resp 16 | Ht 70.0 in | Wt 148.6 lb

## 2015-10-14 DIAGNOSIS — F418 Other specified anxiety disorders: Secondary | ICD-10-CM | POA: Diagnosis not present

## 2015-10-14 DIAGNOSIS — J309 Allergic rhinitis, unspecified: Secondary | ICD-10-CM

## 2015-10-14 DIAGNOSIS — B353 Tinea pedis: Secondary | ICD-10-CM | POA: Diagnosis not present

## 2015-10-14 MED ORDER — FLUTICASONE PROPIONATE 50 MCG/ACT NA SUSP: 2.0000 | Freq: Every day | NASAL | Status: DC

## 2015-10-14 MED ORDER — SERTRALINE HCL 100 MG PO TABS: ORAL_TABLET | ORAL | Status: DC

## 2015-10-14 MED ORDER — NAFTIFINE HCL 1 % EX CREA: TOPICAL_CREAM | Freq: Every day | CUTANEOUS | Status: DC | PRN

## 2015-10-14 MED ORDER — CLONAZEPAM 0.5 MG PO TABS: 0.2500 mg | ORAL_TABLET | Freq: Two times a day (BID) | ORAL | Status: DC | PRN

## 2015-10-14 NOTE — Progress Notes (Signed)
Subjective:    Patient ID: Erika Johns, female    DOB: 1951/07/07, 65 y.o.   MRN: [id number] By signing my name below, I, Zola Button, attest that this documentation has been prepared under the direction and in the presence of Merri Ray, MD.  Electronically Signed: Zola Button, Medical Scribe. 10/14/2015. 1:20 PM.  HPI HPI Comments: Erika Johns is a 65 y.o. female who presents to the [facility] for a medication refill for sertraline, Klonopin, and Naftin. She has an appointment for a physical with me in 1 week. She was recommended to have her vitamin D levels checked.  Depression with anxiety: She is currently taking Zoloft 100 mg qd. Last seen by me in May of last year. Seeing counselor every 3 weeks at that time. Using Klonopin as needed. Planned on 3 month follow-up. Seen by Dr. Joseph Art in October. Continued on same medications. Was also on Naftin for tinea pedis. Patient would like to continue on her current medications. She takes half a Klonopin at night sometimes to help her sleep. She has been much happier since retiring. She has not been doing as much exercise due to pain in her right knee. Patient denies SI/HI.   Patient Active Problem List   Diagnosis Date Noted  . KNEE PAIN 08/27/2009  . LUMBAGO 08/27/2009  . 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 HAND PAIN, LEFT 08/27/2009   Past Medical History  Diagnosis Date  . Allergy   . Arthritis   . Insomnia   . PONV (postoperative nausea and vomiting)     severe n/v-needs scop  . Constipation, chronic   . IBS (irritable bowel syndrome)   . Anxiety   . Depression    Past Surgical History  Procedure Laterality Date  . Knee arthroscopy  2003    lt  . Knee arthroscopy      right  . Fracture surgery  1996    fx [id number]  . Hardware removal  1996    rt ankle  . Dilation and curettage of uterus  2006  . Tonsillectomy    . Colonoscopy    . Orif clavicular fracture Right  05/28/2013    Procedure: OPEN REDUCTION INTERNAL FIXATION (ORIF) CLAVICULAR FRACTURE;  Surgeon: Renette Butters, MD;  Location: [place];  Service: Orthopedics;  Laterality: Right;   Allergies  Allergen Reactions  . Effexor [Venlafaxine Hydrochloride] Other (See Comments)    dizzy  . Hydrocodone Nausea And Vomiting  . Ampicillin Rash   Prior to Admission medications   Medication Sig Start Date End Date Taking? Authorizing Provider  Ascorbic Acid (VITAMIN C) 1000 MG tablet Take 1,000 mg by mouth daily.   Yes Historical Provider, MD  atorvastatin (LIPITOR) 40 MG tablet Take 1 tablet (40 mg total) by mouth every other day. 05/31/15  Yes Robyn Haber, MD  Cholecalciferol (VITAMIN D3) 1000 UNITS CAPS Take by mouth daily.   Yes Historical Provider, MD  clonazePAM (KLONOPIN) 0.5 MG tablet Take 0.5-1 tablets (0.25-0.5 mg total) by mouth 2 (two) times daily as needed for anxiety. 05/31/15  Yes Robyn Haber, MD  diclofenac sodium (VOLTAREN) 1 % GEL Apply topically 4 (four) times daily.   Yes Historical Provider, MD  fluticasone (FLONASE) 50 MCG/ACT nasal spray Place 2 sprays into both nostrils daily. 01/08/14  Yes [name], MD  glucosamine-chondroitin 500-400 MG tablet Take 1 tablet by mouth. 2 gel caps at breakfast   Yes Historical Provider, MD  meloxicam [facility])  7.5 MG tablet Take 7.5 mg by mouth daily.   Yes Historical Provider, MD  naftifine (NAFTIN) 1 % cream Apply topically daily. 05/31/15  Yes Robyn Haber, MD  Omega-3 Fatty Acids (FISH OIL) 1000 MG CAPS Take by mouth.   Yes Historical Provider, MD  Polyethylene Glycol 3350 (MIRALAX PO) Take by mouth.   Yes Historical Provider, MD  sertraline (ZOLOFT) 100 MG tablet TAKE 1 TABLET (100 MG TOTAL) BY MOUTH DAILY   "OFFICE VISIT NEEDED FOR REFILLS" 08/11/15  Yes Bennett Scrape V, PA-C  traMADol (ULTRAM) 50 MG tablet Take 1 tablet (50 mg total) by mouth every 8 (eight) hours as needed. 01/12/15  Yes [name], MD    valACYclovir (VALTREX) 1000 MG tablet Take 2 tabs twice a day for 2 doses for flareups of Cold sores 12/07/12  Yes Darlyne Russian, MD  ibuprofen (ADVIL,MOTRIN) 800 MG tablet Take 1 tablet (800 mg total) by mouth 3 (three) times daily. Patient not taking: Reported on 10/14/2015 05/23/13   Jarrett Soho Muthersbaugh, PA-C  silodosin (RAPAFLO) 8 MG CAPS capsule Take 8 mg by mouth daily with breakfast. Reported on 10/14/2015    Historical Provider, MD   Social History   Social History  . Marital Status: Married    Spouse Name: N/A  . Number of Children: N/A  . Years of Education: N/A   Occupational History  . Not on file.   Social History Main Topics  . Smoking status: Never Smoker   . Smokeless tobacco: Never Used  . Alcohol Use: 0.0 oz/week    0 Standard drinks or equivalent per week  . Drug Use: No  . Sexual Activity: Yes   Other Topics Concern  . Not on file   Social History Narrative     Review of Systems  Psychiatric/Behavioral: Negative for suicidal ideas.       Objective:   Physical Exam  Constitutional: She is oriented to person, place, and time. She appears well-developed and well-nourished. No distress.  HENT:  Head: Normocephalic and atraumatic.  Right Ear: Hearing, tympanic membrane, external ear and ear canal normal.  Left Ear: Hearing, tympanic membrane, external ear and ear canal normal.  Nose: Nose normal.  Mouth/Throat: Oropharynx is clear and moist. No oropharyngeal exudate.  Eyes: Conjunctivae and EOM are normal. Pupils are equal, round, and reactive to light.  Cardiovascular: Normal rate, regular rhythm, normal heart sounds and intact distal pulses.   No murmur heard. Pulmonary/Chest: Effort normal and breath sounds normal. No respiratory distress. She has no wheezes. She has no rhonchi.  Neurological: She is alert and oriented to person, place, and time.  Skin: Skin is warm and dry. No erythema.  Left foot: Small amount of dry skin between the 4th and 5th  toes. No open wounds. No erythema or drainage.  Psychiatric: She has a normal mood and affect. Her behavior is normal.  Vitals reviewed.   Filed Vitals:   10/14/15 1307  BP: 98/72  Pulse: 59  Temp: 98.4 F (36.9 C)  TempSrc: Oral  Resp: 16  Height: 5\' 10"  (1.778 m)  Weight: 148 lb 9.6 oz (67.405 kg)  SpO2: 98%         Assessment & Plan:   Erika Johns is a 65 y.o. female Tinea pedis, unspecified laterality - Plan: naftifine (NAFTIN) 1 % cream `- Episodic. Can try over-the-counter Lotrimin, or refilled Naftin cream if needed. RTC precautions if persistent in spite of antifungal use.  Allergic rhinitis, unspecified allergic rhinitis  type - Plan: fluticasone (FLONASE) 50 MCG/ACT nasal spray  - controlled with Flonase. Refilled.  Depression with anxiety - Plan: clonazePAM (KLONOPIN) 0.5 MG tablet, sertraline (ZOLOFT) 100 MG tablet  - overall stable. Much improved since retiring. Continue Zoloft 100 mg daily, Klonopin as needed. Continue counseling.   Meds ordered this encounter  Medications  . naftifine (NAFTIN) 1 % cream    Sig: Apply topically daily as needed.    Dispense:  60 g    Refill:  1  . fluticasone (FLONASE) 50 MCG/ACT nasal spray    Sig: Place 2 sprays into both nostrils daily.    Dispense:  16 g    Refill:  6  . clonazePAM (KLONOPIN) 0.5 MG tablet    Sig: Take 0.5-1 tablets (0.25-0.5 mg total) by mouth 2 (two) times daily as needed for anxiety.    Dispense:  45 tablet    Refill:  1  . sertraline (ZOLOFT) 100 MG tablet    Sig: TAKE 1 TABLET (100 MG TOTAL) BY MOUTH DAILY    Dispense:  90 tablet    Refill:  1   Patient Instructions  You can try over the counter Lamisil or Naftin cream if needed for foot rash. See info below.   No change in Zoloft dose for now. Klonopin as needed.   flonase refilled.   Athlete's Foot Athlete's foot (tinea pedis) is a fungal infection of the skin on the feet. It often occurs on the skin between the toes or  underneath the toes. It can also occur on the soles of the feet. Athlete's foot is more likely to occur in hot, humid weather. Not washing your feet or changing your socks often enough can contribute to athlete's foot. The infection can spread from person to person (contagious). CAUSES Athlete's foot is caused by a fungus. This fungus thrives in warm, moist places. Most people get athlete's foot by sharing shower stalls, towels, and wet floors with an infected person. People with weakened immune systems, including those with diabetes, may be more likely to get athlete's foot. SYMPTOMS   Itchy areas between the toes or on the soles of the feet.  White, flaky, or scaly areas between the toes or on the soles of the feet.  Tiny, intensely itchy blisters between the toes or on the soles of the feet.  Tiny cuts on the skin. These cuts can develop a bacterial infection.  Thick or discolored toenails. DIAGNOSIS  Your caregiver can usually tell what the problem is by doing a physical exam. Your caregiver may also take a skin sample from the rash area. The skin sample may be examined under a microscope, or it may be tested to see if fungus will grow in the sample. A sample may also be taken from your toenail for testing. TREATMENT  Over-the-counter and prescription medicines can be used to kill the fungus. These medicines are available as powders or creams. Your caregiver can suggest medicines for you. Fungal infections respond slowly to treatment. You may need to continue using your medicine for several weeks. PREVENTION   Do not share towels.  Wear sandals in wet areas, such as shared locker rooms and shared showers.  Keep your feet dry. Wear shoes that allow air to circulate. Wear cotton or wool socks. HOME CARE INSTRUCTIONS   Take medicines as directed by your caregiver. Do not use steroid creams on athlete's foot.  Keep your feet clean and cool. Wash your feet daily and dry them thoroughly,  especially between your toes.  Change your socks every day. Wear cotton or wool socks. In hot climates, you may need to change your socks 2 to 3 times per day.  Wear sandals or canvas tennis shoes with good air circulation.  If you have blisters, soak your feet in Burow's solution or Epsom salts for 20 to 30 minutes, 2 times a day to dry out the blisters. Make sure you dry your feet thoroughly afterward. SEEK MEDICAL CARE IF:   You have a fever.  You have swelling, soreness, warmth, or redness in your foot.  You are not getting better after 7 days of treatment.  You are not completely cured after 30 days.  You have any problems caused by your medicines. MAKE SURE YOU:   Understand these instructions.  Will watch your condition.  Will get help right away if you are not doing well or get worse.   This information is not intended to replace advice given to you by your health care provider. Make sure you discuss any questions you have with your health care provider.   Document Released: 08/05/2000 Document Revised: 10/31/2011 Document Reviewed: 02/09/2015 Elsevier Interactive Patient Education [employer].     I personally performed the services described in this documentation, which was scribed in my presence. The recorded information has been reviewed and considered, and addended by me as needed.

## 2015-10-14 NOTE — Patient Instructions (Signed)
You can try over the counter Lamisil or Naftin cream if needed for foot rash. See info below.   No change in Zoloft dose for now. Klonopin as needed.   flonase refilled.   Athlete's Foot Athlete's foot (tinea pedis) is a fungal infection of the skin on the feet. It often occurs on the skin between the toes or underneath the toes. It can also occur on the soles of the feet. Athlete's foot is more likely to occur in hot, humid weather. Not washing your feet or changing your socks often enough can contribute to athlete's foot. The infection can spread from person to person (contagious). CAUSES Athlete's foot is caused by a fungus. This fungus thrives in warm, moist places. Most people get athlete's foot by sharing shower stalls, towels, and wet floors with an infected person. People with weakened immune systems, including those with diabetes, may be more likely to get athlete's foot. SYMPTOMS   Itchy areas between the toes or on the soles of the feet.  White, flaky, or scaly areas between the toes or on the soles of the feet.  Tiny, intensely itchy blisters between the toes or on the soles of the feet.  Tiny cuts on the skin. These cuts can develop a bacterial infection.  Thick or discolored toenails. DIAGNOSIS  Your caregiver can usually tell what the problem is by doing a physical exam. Your caregiver may also take a skin sample from the rash area. The skin sample may be examined under a microscope, or it may be tested to see if fungus will grow in the sample. A sample may also be taken from your toenail for testing. TREATMENT  Over-the-counter and prescription medicines can be used to kill the fungus. These medicines are available as powders or creams. Your caregiver can suggest medicines for you. Fungal infections respond slowly to treatment. You may need to continue using your medicine for several weeks. PREVENTION   Do not share towels.  Wear sandals in wet areas, such as shared  locker rooms and shared showers.  Keep your feet dry. Wear shoes that allow air to circulate. Wear cotton or wool socks. HOME CARE INSTRUCTIONS   Take medicines as directed by your caregiver. Do not use steroid creams on athlete's foot.  Keep your feet clean and cool. Wash your feet daily and dry them thoroughly, especially between your toes.  Change your socks every day. Wear cotton or wool socks. In hot climates, you may need to change your socks 2 to 3 times per day.  Wear sandals or canvas tennis shoes with good air circulation.  If you have blisters, soak your feet in Burow's solution or Epsom salts for 20 to 30 minutes, 2 times a day to dry out the blisters. Make sure you dry your feet thoroughly afterward. SEEK MEDICAL CARE IF:   You have a fever.  You have swelling, soreness, warmth, or redness in your foot.  You are not getting better after 7 days of treatment.  You are not completely cured after 30 days.  You have any problems caused by your medicines. MAKE SURE YOU:   Understand these instructions.  Will watch your condition.  Will get help right away if you are not doing well or get worse.   This information is not intended to replace advice given to you by your health care provider. Make sure you discuss any questions you have with your health care provider.   Document Released: 08/05/2000 Document Revised: 10/31/2011 Document  Reviewed: 02/09/2015 Elsevier Interactive Patient Education [employer].

## 2015-10-21 ENCOUNTER — Ambulatory Visit (INDEPENDENT_AMBULATORY_CARE_PROVIDER_SITE_OTHER): Payer: PPO | Admitting: Family Medicine

## 2015-10-21 ENCOUNTER — Encounter: Payer: Self-pay | Admitting: Family Medicine

## 2015-10-21 VITALS — BP 100/60 | HR 66 | Temp 98.6°F | Resp 16 | Ht 70.0 in | Wt 147.8 lb

## 2015-10-21 DIAGNOSIS — Z1159 Encounter for screening for other viral diseases: Secondary | ICD-10-CM | POA: Diagnosis not present

## 2015-10-21 DIAGNOSIS — Z0000 Encounter for general adult medical examination without abnormal findings: Secondary | ICD-10-CM

## 2015-10-21 DIAGNOSIS — K6289 Other specified diseases of anus and rectum: Secondary | ICD-10-CM

## 2015-10-21 DIAGNOSIS — M8580 Other specified disorders of bone density and structure, unspecified site: Secondary | ICD-10-CM | POA: Diagnosis not present

## 2015-10-21 DIAGNOSIS — Z114 Encounter for screening for human immunodeficiency virus [HIV]: Secondary | ICD-10-CM

## 2015-10-21 LAB — HEPATITIS C ANTIBODY: HCV AB: NEGATIVE

## 2015-10-21 LAB — HIV ANTIBODY (ROUTINE TESTING W REFLEX): HIV 1&2 Ab, 4th Generation: NONREACTIVE

## 2015-10-21 MED ORDER — HYDROCORTISONE ACE-PRAMOXINE 1-1 % RE FOAM: 1.0000 | Freq: Two times a day (BID) | RECTAL | Status: DC | PRN

## 2015-10-21 NOTE — Progress Notes (Signed)
Subjective:    Patient ID: Erika Johns, female    DOB: 1951/07/29, 65 y.o.   MRN: [id number] By signing my name below, I, Zola Button, attest that this documentation has been prepared under the direction and in the presence of Merri Ray, MD.  Electronically Signed: Zola Button, Medical Scribe. 10/21/2015. 11:55 AM.  HPI HPI Comments: Erika Johns is a 64 y.o. female with a history of osteopenia who presents to the [facility] for an annual exam. Patient is fasting today.  Anal irritation: Patient is requesting a refill of Proctofoam HC for anal irritation. She has constipation at baseline and believes she may have IBS. She takes Miralax but notes her bowels have been "off" for the past 2 weeks. Patient has had some anal irritation after the bowel movements due to the constipation. She last saw Dr. Wynetta Emery about 4 years ago. Patient denies anal bleeding. She does not believe she has any hemorrhoids.   Cancer screening:  Colon cancer screening - Her last colonoscopy was in 2013. She was told to repeat in 5 years. Cervical cancer screening - Her last pap test was done on 05/27/15 and was normal. Breast cancer screening - Last mammogram in May of last year.  Immunizations:  Immunization History  Administered Date(s) Administered  . Influenza Split 04/28/2012, 05/22/2013, 06/05/2014  . Influenza-Unspecified 05/25/2015  . Pneumococcal Conjugate-13 01/12/2015, 05/31/2015  . Td 11/22/2007  . Zoster 06/22/2010    Depression screening:  Depression screen [facility] 2/9 10/21/2015 10/14/2015 05/31/2015 01/12/2015 08/04/2014  Decreased Interest 0 0 0 0 0  Down, Depressed, Hopeless 0 0 0 0 0  PHQ - 2 Score 0 0 0 0 0   Fall screening: 0 falls in the past year.   Exercise: She does yoga twice a week and has also been walking for exercise.  Dentist: She sees a Pharmacist, community every year.  Optho: Her last visit was last year in May.   Visual Acuity Screening   Right eye Left eye Both eyes  Without correction:     With correction: 20/25 20/20 20/20     Advanced directives: She does have a living will, but does not think we have a copy on file.  HIV testing: She has never been tested and agrees to be tested today.  Hepatitis C testing: She agrees to be tested today.  Lipid screening: She takes Lipitor. Lab Results  Component Value Date   CHOL 162 05/31/2015   HDL 71 05/31/2015   LDLCALC 76 05/31/2015   TRIG 73 05/31/2015   CHOLHDL 2.3 05/31/2015     Patient Active Problem List   Diagnosis Date Noted  . KNEE PAIN 08/27/2009  . LUMBAGO 08/27/2009  . 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 HAND PAIN, LEFT 08/27/2009   Past Medical History  Diagnosis Date  . Allergy   . Arthritis   . Insomnia   . PONV (postoperative nausea and vomiting)     severe n/v-needs scop  . Constipation, chronic   . IBS (irritable bowel syndrome)   . Anxiety   . Depression    Past Surgical History  Procedure Laterality Date  . Knee arthroscopy  2003    lt  . Knee arthroscopy      right  . Fracture surgery  1996    fx tib-fib-rt  . Hardware removal  1996    rt ankle  . Dilation and curettage of uterus  2006  . Tonsillectomy    . Colonoscopy    .  Orif clavicular fracture Right 05/28/2013    Procedure: OPEN REDUCTION INTERNAL FIXATION (ORIF) CLAVICULAR FRACTURE;  Surgeon: Renette Butters, MD;  Location: [facility];  Service: Orthopedics;  Laterality: Right;   Allergies  Allergen Reactions  . Effexor [Venlafaxine Hydrochloride] Other (See Comments)    dizzy  . Hydrocodone Nausea And Vomiting  . Ampicillin Rash   Prior to Admission medications   Medication Sig Start Date End Date Taking? Authorizing Provider  Ascorbic Acid (VITAMIN C) 1000 MG tablet Take 1,000 mg by mouth daily.   Yes Historical Provider, MD  atorvastatin (LIPITOR) 40 MG tablet Take 1 tablet (40 mg total) by mouth every other day. 05/31/15  Yes Robyn Haber, MD    Cholecalciferol (VITAMIN D3) 1000 UNITS CAPS Take by mouth daily.   Yes Historical Provider, MD  clonazePAM (KLONOPIN) 0.5 MG tablet Take 0.5-1 tablets (0.25-0.5 mg total) by mouth 2 (two) times daily as needed for anxiety. 10/14/15  Yes [name], MD  diclofenac sodium (VOLTAREN) 1 % GEL Apply topically 4 (four) times daily.   Yes Historical Provider, MD  fluticasone (FLONASE) 50 MCG/ACT nasal spray Place 2 sprays into both nostrils daily. 10/14/15  Yes [name], MD  glucosamine-chondroitin 500-400 MG tablet Take 1 tablet by mouth. 2 gel caps at breakfast   Yes Historical Provider, MD  meloxicam (MOBIC) 7.5 MG tablet Take 7.5 mg by mouth daily.   Yes Historical Provider, MD  naftifine (NAFTIN) 1 % cream Apply topically daily as needed. 10/14/15  Yes [name], MD  Omega-3 Fatty Acids (FISH OIL) 1000 MG CAPS Take by mouth.   Yes Historical Provider, MD  Polyethylene Glycol 3350 (MIRALAX PO) Take by mouth.   Yes Historical Provider, MD  sertraline (ZOLOFT) 100 MG tablet TAKE 1 TABLET (100 MG TOTAL) BY MOUTH DAILY 10/14/15  Yes [name], MD  traMADol (ULTRAM) 50 MG tablet Take 1 tablet (50 mg total) by mouth every 8 (eight) hours as needed. 01/12/15  Yes [name], MD  valACYclovir (VALTREX) 1000 MG tablet Take 2 tabs twice a day for 2 doses for flareups of Cold sores 12/07/12  Yes Darlyne Russian, MD  silodosin (RAPAFLO) 8 MG CAPS capsule Take 8 mg by mouth daily with breakfast. Reported on 10/21/2015    Historical Provider, MD   Social History   Social History  . Marital Status: Married    Spouse Name: N/A  . Number of Children: N/A  . Years of Education: N/A   Occupational History  . Not on file.   Social History Main Topics  . Smoking status: Never Smoker   . Smokeless tobacco: Never Used  . Alcohol Use: 0.0 oz/week    0 Standard drinks or equivalent per week  . Drug Use: No  . Sexual Activity: Yes   Other Topics Concern  . Not on file   Social  History Narrative     Review of Systems 13 point ROS reviewed on patient health survey. Negative other than listed above or in nursing note. See nursing note.     Objective:   Physical Exam  Genitourinary:  Rectal exam: No fissures. No bleeding. No external hemorrhoids.    Filed Vitals:   10/21/15 1046  BP: 100/60  Pulse: 66  Temp: 98.6 F (37 C)  TempSrc: Oral  Resp: 16  Height: 5\' 10"  (1.778 m)  Weight: 147 lb 12.8 oz (67.042 kg)  SpO2: 98%  Assessment & Plan:   Erika Johns is a 65 y.o. female Medicare annual wellness visit, initial  --anticipatory guidance as below in AVS, screening labs above. Health maintenance items as above in HPI discussed/recommended as applicable.   Anal irritation - Plan: hydrocortisone-pramoxine (PROCTOFOAM HC) rectal foam  -ok to use proctofoam temporarily, but if persistent perianal discomfort recommend GI eval.   Encounter for screening for HIV - Plan: HIV antibody   Need for hepatitis C screening test - Plan: Hepatitis C antibody  Osteopenia - Plan: Vitamin D, 25-hydroxy  -cont follow up with sports medicine, vit d level pending.   Meds ordered this encounter  Medications  . hydrocortisone-pramoxine (PROCTOFOAM HC) rectal foam    Sig: Place 1 applicator rectally 2 (two) times daily as needed for hemorrhoids or itching.    Dispense:  10 g    Refill:  0   Patient Instructions  Can try proctofoam temporarily but if you need this frequently or persisitent irritation at anus, or difficulty with bowel movements, I recommend calling your gastroenterologist.  Return to the clinic or go to the nearest emergency room if any of your symptoms worsen or new symptoms occur.  You should receive a call or letter about your lab results within the next week to 10 days.    Keeping you healthy  Get these tests  Blood pressure- Have your blood pressure checked once a year by your healthcare provider.  Normal blood pressure  is 120/80  Weight- Have your body mass index (BMI) calculated to screen for obesity.  BMI is a measure of body fat based on height and weight. You can also calculate your own BMI at [email or web address].  Cholesterol- Have your cholesterol checked every year.  Diabetes- Have your blood sugar checked regularly if you have high blood pressure, high cholesterol, have a family history of diabetes or if you are overweight.  Screening for Colon Cancer- Colonoscopy starting at age 78.  Screening may begin sooner depending on your family history and other health conditions. Follow up colonoscopy as directed by your Gastroenterologist.  Screening for Prostate Cancer- Both blood work (PSA) and a rectal exam help screen for Prostate Cancer.  Screening begins at age 63 with African-American men and at age 27 with Caucasian men.  Screening may begin sooner depending on your family history.  Take these medicines  Aspirin- One aspirin daily can help prevent Heart disease and Stroke.  Flu shot- Every fall.  Tetanus- Every 10 years.  Zostavax- Once after the age of 63 to prevent Shingles.  Pneumonia shot- Once after the age of 75; if you are younger than 40, ask your healthcare provider if you need a Pneumonia shot.  Take these steps  Don't smoke- If you do smoke, talk to your doctor about quitting.  For tips on how to quit, go to www.smokefree.gov or call 1-800-QUIT-NOW.  Be physically active- Exercise 5 days a week for at least 30 minutes.  If you are not already physically active start slow and gradually work up to 30 minutes of moderate physical activity.  Examples of moderate activity include walking briskly, mowing the yard, dancing, swimming, bicycling, etc.  Eat a healthy diet- Eat a variety of healthy food such as fruits, vegetables, low fat milk, low fat cheese, yogurt, lean meant, poultry, fish, beans, tofu, etc. For more information go to www.thenutritionsource.org  Drink alcohol in  moderation- Limit alcohol intake to less than two drinks a day. Never drink and drive.  Dentist- Brush  and floss twice daily; visit your dentist twice a year.  Depression- Your emotional health is as important as your physical health. If you're feeling down, or losing interest in things you would normally enjoy please talk to your healthcare provider.  Eye exam- Visit your eye doctor every year.  Safe sex- If you may be exposed to a sexually transmitted infection, use a condom.  Seat belts- Seat belts can save your life; always wear one.  Smoke/Carbon Monoxide detectors- These detectors need to be installed on the appropriate level of your home.  Replace batteries at least once a year.  Skin cancer- When out in the sun, cover up and use sunscreen 15 SPF or higher.  Violence- If anyone is threatening you, please tell your healthcare provider.  Living Will/ Health care power of attorney- Speak with your healthcare provider and family.          I personally performed the services described in this documentation, which was scribed in my presence. The recorded information has been reviewed and considered, and addended by me as needed.

## 2015-10-21 NOTE — Patient Instructions (Addendum)
Can try proctofoam temporarily but if you need this frequently or persisitent irritation at anus, or difficulty with bowel movements, I recommend calling your gastroenterologist.  Return to the clinic or go to the nearest emergency room if any of your symptoms worsen or new symptoms occur.  You should receive a call or letter about your lab results within the next week to 10 days.    Keeping you healthy  Get these tests  Blood pressure- Have your blood pressure checked once a year by your healthcare provider.  Normal blood pressure is 120/80  Weight- Have your body mass index (BMI) calculated to screen for obesity.  BMI is a measure of body fat based on height and weight. You can also calculate your own BMI at [email or web address].  Cholesterol- Have your cholesterol checked every year.  Diabetes- Have your blood sugar checked regularly if you have high blood pressure, high cholesterol, have a family history of diabetes or if you are overweight.  Screening for Colon Cancer- Colonoscopy starting at age 54.  Screening may begin sooner depending on your family history and other health conditions. Follow up colonoscopy as directed by your Gastroenterologist.  Screening for Prostate Cancer- Both blood work (PSA) and a rectal exam help screen for Prostate Cancer.  Screening begins at age 73 with African-American men and at age 64 with Caucasian men.  Screening may begin sooner depending on your family history.  Take these medicines  Aspirin- One aspirin daily can help prevent Heart disease and Stroke.  Flu shot- Every fall.  Tetanus- Every 10 years.  Zostavax- Once after the age of 32 to prevent Shingles.  Pneumonia shot- Once after the age of 91; if you are younger than 26, ask your healthcare provider if you need a Pneumonia shot.  Take these steps  Don't smoke- If you do smoke, talk to your doctor about quitting.  For tips on how to quit, go to www.smokefree.gov or call  1-800-QUIT-NOW.  Be physically active- Exercise 5 days a week for at least 30 minutes.  If you are not already physically active start slow and gradually work up to 30 minutes of moderate physical activity.  Examples of moderate activity include walking briskly, mowing the yard, dancing, swimming, bicycling, etc.  Eat a healthy diet- Eat a variety of healthy food such as fruits, vegetables, low fat milk, low fat cheese, yogurt, lean meant, poultry, fish, beans, tofu, etc. For more information go to www.thenutritionsource.org  Drink alcohol in moderation- Limit alcohol intake to less than two drinks a day. Never drink and drive.  Dentist- Brush and floss twice daily; visit your dentist twice a year.  Depression- Your emotional health is as important as your physical health. If you're feeling down, or losing interest in things you would normally enjoy please talk to your healthcare provider.  Eye exam- Visit your eye doctor every year.  Safe sex- If you may be exposed to a sexually transmitted infection, use a condom.  Seat belts- Seat belts can save your life; always wear one.  Smoke/Carbon Monoxide detectors- These detectors need to be installed on the appropriate level of your home.  Replace batteries at least once a year.  Skin cancer- When out in the sun, cover up and use sunscreen 15 SPF or higher.  Violence- If anyone is threatening you, please tell your healthcare provider.  Living Will/ Health care power of attorney- Speak with your healthcare provider and family.

## 2015-10-22 LAB — VITAMIN D 25 HYDROXY (VIT D DEFICIENCY, FRACTURES): VIT D 25 HYDROXY: 38 ng/mL (ref 30–100)

## 2015-10-23 ENCOUNTER — Telehealth: Payer: Self-pay

## 2015-10-23 DIAGNOSIS — K6289 Other specified diseases of anus and rectum: Secondary | ICD-10-CM

## 2015-10-23 NOTE — Telephone Encounter (Signed)
Patient was recently seen by Dr. Nyoka Cowden and was prescribed proctofoam. Patient states that it's not covered by the insurance but it will cover proctofoam cream. Patient was prescribed the cream sometime in the past and it worked well for her. Patient would like proctofoam cream to [facility] [address]!  Please call!

## 2015-10-26 MED ORDER — HYDROCORTISONE ACE-PRAMOXINE 1-1 % RE CREA: 1.0000 "application " | TOPICAL_CREAM | Freq: Two times a day (BID) | RECTAL | Status: DC

## 2015-10-26 NOTE — Telephone Encounter (Signed)
Prescription sent to pharmacy.  Contacted patient and left voicemail that this was sent to pharmacy.

## 2015-11-02 ENCOUNTER — Encounter: Payer: Self-pay | Admitting: Family Medicine

## 2015-11-04 ENCOUNTER — Telehealth: Payer: Self-pay | Admitting: Family Medicine

## 2015-11-04 DIAGNOSIS — K6289 Other specified diseases of anus and rectum: Secondary | ICD-10-CM

## 2015-11-04 MED ORDER — HYDROCORTISONE 2.5 % RE CREA: 1.0000 "application " | TOPICAL_CREAM | Freq: Two times a day (BID) | RECTAL | Status: DC | PRN

## 2015-11-04 MED ORDER — LIDOCAINE (ANORECTAL) 5 % EX GEL: 1.0000 "application " | Freq: Two times a day (BID) | CUTANEOUS | Status: DC | PRN

## 2015-11-04 MED ORDER — LIDOCAINE-PRILOCAINE 2.5-2.5 % EX CREA: 1.0000 "application " | TOPICAL_CREAM | Freq: Once | CUTANEOUS | Status: DC | PRN

## 2015-11-04 NOTE — Telephone Encounter (Signed)
Noted. New rx sent to pharmacy, but I changed Emla cream to lidocaine anorectal gel.  I have not usually used Emla cream for perianal application.  Would start with lidocaine by itself without the prilocaine.   Let me know if there are any  questions.

## 2015-11-04 NOTE — Telephone Encounter (Signed)
Patient walked into [address] requesting for Dr. Carlota Raspberry to call in another prescription that her insurance will cover. Patient stated her insurance will cover Proctosol-HC 2.5% (Hydrocortisone Cream) ans Lidpcaine 2.5% and Prilocaine 2.5% cream. Patient want both prescriptions called into pharmacy [facility] on [address]. I informed Dr. Carlota Raspberry about the issue and he stated for me to put in a telephone message. Thank you, Delos Haring

## 2015-11-13 NOTE — Telephone Encounter (Signed)
I have completed the PA form and faxed back to [facility] w/ confirmation.

## 2015-11-13 NOTE — Telephone Encounter (Signed)
It appears that pt is asking for a prior auth to be completed now that she has failed ins formulary alternative. Called [employer] and they will send me a new form to complete. Advised pt on MyChart of the status.

## 2015-11-16 NOTE — Telephone Encounter (Signed)
PA approved from 3/24 - 08/21/2016. Notified pt on MyChart.

## 2016-01-11 DIAGNOSIS — Z6821 Body mass index (BMI) 21.0-21.9, adult: Secondary | ICD-10-CM | POA: Diagnosis not present

## 2016-01-11 DIAGNOSIS — K5900 Constipation, unspecified: Secondary | ICD-10-CM | POA: Diagnosis not present

## 2016-01-12 DIAGNOSIS — Z1231 Encounter for screening mammogram for malignant neoplasm of breast: Secondary | ICD-10-CM | POA: Diagnosis not present

## 2016-01-12 DIAGNOSIS — Z803 Family history of malignant neoplasm of breast: Secondary | ICD-10-CM | POA: Diagnosis not present

## 2016-01-19 DIAGNOSIS — M170 Bilateral primary osteoarthritis of knee: Secondary | ICD-10-CM | POA: Diagnosis not present

## 2016-02-03 DIAGNOSIS — H0012 Chalazion right lower eyelid: Secondary | ICD-10-CM | POA: Diagnosis not present

## 2016-02-16 DIAGNOSIS — R3916 Straining to void: Secondary | ICD-10-CM | POA: Diagnosis not present

## 2016-02-16 DIAGNOSIS — R350 Frequency of micturition: Secondary | ICD-10-CM | POA: Diagnosis not present

## 2016-03-22 DIAGNOSIS — H0012 Chalazion right lower eyelid: Secondary | ICD-10-CM | POA: Diagnosis not present

## 2016-03-22 DIAGNOSIS — H04123 Dry eye syndrome of bilateral lacrimal glands: Secondary | ICD-10-CM | POA: Diagnosis not present

## 2016-04-19 DIAGNOSIS — H04123 Dry eye syndrome of bilateral lacrimal glands: Secondary | ICD-10-CM | POA: Diagnosis not present

## 2016-04-19 DIAGNOSIS — H5203 Hypermetropia, bilateral: Secondary | ICD-10-CM | POA: Diagnosis not present

## 2016-04-19 DIAGNOSIS — H52223 Regular astigmatism, bilateral: Secondary | ICD-10-CM | POA: Diagnosis not present

## 2016-04-19 DIAGNOSIS — H2513 Age-related nuclear cataract, bilateral: Secondary | ICD-10-CM | POA: Diagnosis not present

## 2016-04-19 DIAGNOSIS — H35373 Puckering of macula, bilateral: Secondary | ICD-10-CM | POA: Diagnosis not present

## 2016-04-19 DIAGNOSIS — H353132 Nonexudative age-related macular degeneration, bilateral, intermediate dry stage: Secondary | ICD-10-CM | POA: Diagnosis not present

## 2016-04-19 DIAGNOSIS — H02831 Dermatochalasis of right upper eyelid: Secondary | ICD-10-CM | POA: Diagnosis not present

## 2016-04-19 DIAGNOSIS — H524 Presbyopia: Secondary | ICD-10-CM | POA: Diagnosis not present

## 2016-05-12 ENCOUNTER — Ambulatory Visit (INDEPENDENT_AMBULATORY_CARE_PROVIDER_SITE_OTHER): Payer: PPO

## 2016-05-12 ENCOUNTER — Ambulatory Visit (INDEPENDENT_AMBULATORY_CARE_PROVIDER_SITE_OTHER): Payer: PPO | Admitting: Family Medicine

## 2016-05-12 ENCOUNTER — Encounter: Payer: Self-pay | Admitting: Family Medicine

## 2016-05-12 VITALS — BP 110/68 | HR 67 | Temp 98.4°F | Resp 18 | Ht 70.0 in | Wt 147.0 lb

## 2016-05-12 DIAGNOSIS — M79674 Pain in right toe(s): Secondary | ICD-10-CM

## 2016-05-12 DIAGNOSIS — S99921A Unspecified injury of right foot, initial encounter: Secondary | ICD-10-CM | POA: Diagnosis not present

## 2016-05-12 DIAGNOSIS — F439 Reaction to severe stress, unspecified: Secondary | ICD-10-CM

## 2016-05-12 DIAGNOSIS — Z658 Other specified problems related to psychosocial circumstances: Secondary | ICD-10-CM

## 2016-05-12 DIAGNOSIS — M25562 Pain in left knee: Secondary | ICD-10-CM

## 2016-05-12 DIAGNOSIS — F418 Other specified anxiety disorders: Secondary | ICD-10-CM

## 2016-05-12 DIAGNOSIS — M1712 Unilateral primary osteoarthritis, left knee: Secondary | ICD-10-CM

## 2016-05-12 DIAGNOSIS — M179 Osteoarthritis of knee, unspecified: Secondary | ICD-10-CM | POA: Diagnosis not present

## 2016-05-12 DIAGNOSIS — Z23 Encounter for immunization: Secondary | ICD-10-CM | POA: Diagnosis not present

## 2016-05-12 MED ORDER — CLONAZEPAM 0.5 MG PO TABS: 0.2500 mg | ORAL_TABLET | Freq: Two times a day (BID) | ORAL | 1 refills | Status: DC | PRN

## 2016-05-12 MED ORDER — SERTRALINE HCL 100 MG PO TABS: ORAL_TABLET | ORAL | 1 refills | Status: DC

## 2016-05-12 NOTE — Patient Instructions (Addendum)
  Continue Zoloft at same dose, Klonopin 1/2-1 pill up to twice per day as needed. Let me know if I can help further with stress and anxiety symptoms.  Your knee pain is likely due to a flare of arthritis. Try the reaction brace and diclofenac as prescribed by orthopedist to see if this will help. If you have persistent symptoms, can follow-up with orthopedics for possible injection.  I do not see a toe fracture. I will let you know if the radiologist sees something different. Hard soled shoes such as a hiking shoe may be more comfortable than sandals or tennis shoes. If pain is not improving in the next 1-2 weeks, recheck with repeat x-ray. Sooner if worse.  IF you received an x-ray today, you will receive an invoice from [facility]. Please contact [facility] at [phone] with questions or concerns regarding your invoice.   IF you received labwork today, you will receive an invoice from [employer]. Please contact [facility] at [phone] with questions or concerns regarding your invoice.   Our billing staff will not be able to assist you with questions regarding bills from these companies.  You will be contacted with the lab results as soon as they are available. The fastest way to get your results is to activate your My Chart account. Instructions are located on the last page of this paperwork. If you have not heard from [name] regarding the results in 2 weeks, please contact this office.

## 2016-05-12 NOTE — Progress Notes (Signed)
Subjective:  By signing my name below, I, Moises Blood, attest that this documentation has been prepared under the direction and in the presence of Merri Ray, MD. Electronically Signed: Moises Blood, [place]. 05/12/2016 , 11:21 AM .  Patient was seen in Room 3 .   Patient ID: Erika Johns, female    DOB: 08-04-1951, 65 y.o.   MRN: [id number] Chief Complaint  Patient presents with  . Medication Refill    klonopin  . Flu Vaccine  . Foot Pain    right fooet and knee pain   HPI Erika Johns is a 65 y.o. female Here for follow up.   Anxiety H/o anxiety with depression. She's on klonopin 0.5mg  prn and zoloft 100mg  qd. She has recent stressors with husband's terminal illness. He has been given a few weeks to live. She also has a Social worker, Vivia Budge.   Her husband has been agitated lately. He woke up at 5:00AM this morning trying to get out of bed and shower by himself. He's been taking 1 tablet of ativan. Patient's neighbor is a caregiver and has offered to help, so was called this morning. They advised the husband to take 2 tablets of ativan instead for rest. Patient was informed he has a few days to live.   She takes klonopin almost every night at the moment.   Foot/knee pain She's also complaining of right foot pain and left knee pain.   Right foot She tripped over her shoes about a week ago. She rolled her right great toe and area has been black and blue. She is able to walk and move it but it's sore.   Left knee swelling She has a history of arthritis left knee. She had her last cortisone injection for arthritis of left knee in June. She's usually followed by Dr. Noemi Chapel. She's been applying topical gel, ice and wearing a knee brace. She's also been given pill form of diclofenac.   Immunizations She also received flu shot today.   Patient Active Problem List   Diagnosis Date Noted  . KNEE PAIN 08/27/2009  . LUMBAGO 08/27/2009  .  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 HAND PAIN, LEFT 08/27/2009   Past Medical History:  Diagnosis Date  . Allergy   . Anxiety   . Arthritis   . Constipation, chronic   . Depression   . IBS (irritable bowel syndrome)   . Insomnia   . PONV (postoperative nausea and vomiting)    severe n/v-needs scop   Past Surgical History:  Procedure Laterality Date  . COLONOSCOPY    . DILATION AND CURETTAGE OF UTERUS  2006  . FRACTURE SURGERY  1996   fx tib-[id number]  . HARDWARE REMOVAL  1996   rt ankle  . KNEE ARTHROSCOPY  2003   lt  . KNEE ARTHROSCOPY     right  . ORIF CLAVICULAR FRACTURE Right 05/28/2013   Procedure: OPEN REDUCTION INTERNAL FIXATION (ORIF) CLAVICULAR FRACTURE;  Surgeon: Renette Butters, MD;  Location: [place];  Service: Orthopedics;  Laterality: Right;  . TONSILLECTOMY     Allergies  Allergen Reactions  . Effexor [Venlafaxine Hydrochloride] Other (See Comments)    dizzy  . Hydrocodone Nausea And Vomiting  . Ampicillin Rash   Prior to Admission medications   Medication Sig Start Date End Date Taking? Authorizing Provider  Ascorbic Acid (VITAMIN C) 1000 MG tablet Take 1,000 mg by mouth daily.    Historical Provider, MD  atorvastatin (LIPITOR) 40 MG  tablet Take 1 tablet (40 mg total) by mouth every other day. 05/31/15   Robyn Haber, MD  Cholecalciferol (VITAMIN D3) 1000 UNITS CAPS Take by mouth daily.    Historical Provider, MD  clonazePAM (KLONOPIN) 0.5 MG tablet Take 0.5-1 tablets (0.25-0.5 mg total) by mouth 2 (two) times daily as needed for anxiety. 10/14/15   [name], MD  diclofenac sodium (VOLTAREN) 1 % GEL Apply topically 4 (four) times daily.    Historical Provider, MD  fluticasone (FLONASE) 50 MCG/ACT nasal spray Place 2 sprays into both nostrils daily. 10/14/15   [name], MD  glucosamine-chondroitin 500-400 MG tablet Take 1 tablet by mouth. 2 gel caps at breakfast    Historical Provider, MD  hydrocortisone (ANUSOL-HC) 2.5 %  rectal cream Place 1 application rectally 2 (two) times daily as needed for hemorrhoids or itching. 11/04/15   [name], MD  Lidocaine, Anorectal, 5 % GEL Apply 1 application topically 2 (two) times daily as needed. 11/04/15   [name], MD  meloxicam (MOBIC) 7.5 MG tablet Take 7.5 mg by mouth daily.    Historical Provider, MD  naftifine (NAFTIN) 1 % cream Apply topically daily as needed. 10/14/15   [name], MD  Omega-3 Fatty Acids (FISH OIL) 1000 MG CAPS Take by mouth.    Historical Provider, MD  Polyethylene Glycol 3350 (MIRALAX PO) Take by mouth.    Historical Provider, MD  sertraline (ZOLOFT) 100 MG tablet TAKE 1 TABLET (100 MG TOTAL) BY MOUTH DAILY 10/14/15   [name], MD  traMADol (ULTRAM) 50 MG tablet Take 1 tablet (50 mg total) by mouth every 8 (eight) hours as needed. 01/12/15   [name], MD  valACYclovir (VALTREX) 1000 MG tablet Take 2 tabs twice a day for 2 doses for flareups of Cold sores 12/07/12   Darlyne Russian, MD   Social History   Social History  . Marital status: Married    Spouse name: N/A  . Number of children: N/A  . Years of education: N/A   Occupational History  . Not on file.   Social History Main Topics  . Smoking status: Never Smoker  . Smokeless tobacco: Never Used  . Alcohol use 0.0 oz/week  . Drug use: No  . Sexual activity: Yes   Other Topics Concern  . Not on file   Social History Narrative  . No narrative on file   Review of Systems  Constitutional: Negative for chills, fatigue and fever.  Gastrointestinal: Negative for diarrhea, nausea and vomiting.  Musculoskeletal: Positive for arthralgias, joint swelling and myalgias. Negative for gait problem.  Skin: Negative for wound.  Neurological: Negative for dizziness, weakness and numbness.       Objective:   Physical Exam  Constitutional: She is oriented to person, place, and time. She appears well-developed and well-nourished. No distress.  HENT:  Head:  Normocephalic and atraumatic.  Eyes: EOM are normal. Pupils are equal, round, and reactive to light.  Neck: Neck supple.  Cardiovascular: Normal rate.   Pulmonary/Chest: Effort normal. No respiratory distress.  Musculoskeletal: Normal range of motion.  Right foot: there are some resolving ecchymoses in 2nd through 4th toes, and base of the great toe dorsal surface; tender along proximal phalanx of the great toe Left knee: slight effusion of left knee, skin tact, no warmth, ecchymosis, or erythema; full rom, tender along medial jointline, negative mcmurray, negative varus and valgus  Neurological: She is alert and oriented to person, place,  and time.  Skin: Skin is warm and dry.  Psychiatric: She has a normal mood and affect. Her behavior is normal.  Nursing note and vitals reviewed.    Vitals:   05/12/16 1035  BP: 110/68  Pulse: 67  Resp: 18  Temp: 98.4 F (36.9 C)  TempSrc: Oral  SpO2: 97%  Weight: 147 lb (66.7 kg)  Height: 5\' 10"  (1.778 m)   Dg Toe Great Right  Result Date: 05/12/2016 CLINICAL DATA:  Injury. EXAM: RIGHT GREAT TOE COMPARISON:  No recent. FINDINGS: Degenerative changes right great toe. No evidence of fracture or dislocation. No radiopaque foreign body. IMPRESSION: Degenerative changes right great toe.  No acute abnormality. Electronically Signed   By: Marcello Moores  Register   On: 05/12/2016 12:18      Assessment & Plan:   Erika Johns is a 65 y.o. female Depression with anxiety - Plan: sertraline (ZOLOFT) 100 MG tablet, clonazePAM (KLONOPIN) 0.5 MG tablet, Situational stress - Plan: sertraline (ZOLOFT) 100 MG tablet, clonazePAM (KLONOPIN) 0.5 MG tablet  -Overall doing well and setting of husband's impending passing. Has counselor, and discussed Klonopin dosing if needed up to twice per day. Also has hospice at home which appears to be helping. Advised to call me if any assistance needed.   Need for prophylactic vaccination and inoculation against  influenza - Plan: Flu Vaccine QUAD 36+ mos IM  Great toe pain, right - Plan: DG Toe Great Right  - Contusion likely without apparent fracture on x-ray. Hard soled shoe discussed, RTC precautions.  Left medial knee pain - Plan: Apply knee sleeve Osteoarthritis of left knee, unspecified osteoarthritis type - Plan: Apply knee sleeve  -DJD of left knee by history with previous injections by orthopedics. Likely flare of osteoarthritis.  - OA reaction web brace applied, pneumatic care with diclofenac short-term if needed, and follow-up with orthopedics or myself if needed in next few weeks.    Meds ordered this encounter  Medications  . sertraline (ZOLOFT) 100 MG tablet    Sig: TAKE 1 TABLET (100 MG TOTAL) BY MOUTH DAILY    Dispense:  90 tablet    Refill:  1  . clonazePAM (KLONOPIN) 0.5 MG tablet    Sig: Take 0.5-1 tablets (0.25-0.5 mg total) by mouth 2 (two) times daily as needed for anxiety.    Dispense:  45 tablet    Refill:  1   Patient Instructions    Continue Zoloft at same dose, Klonopin 1/2-1 pill up to twice per day as needed. Let me know if I can help further with stress and anxiety symptoms.  Your knee pain is likely due to a flare of arthritis. Try the reaction brace and diclofenac as prescribed by orthopedist to see if this will help. If you have persistent symptoms, can follow-up with orthopedics for possible injection.    IF you received an x-ray today, you will receive an invoice from [facility]. Please contact [facility] at [phone] with questions or concerns regarding your invoice.   IF you received labwork today, you will receive an invoice from [employer]. Please contact [facility] at [phone] with questions or concerns regarding your invoice.   Our billing staff will not be able to assist you with questions regarding bills from these companies.  You will be contacted with the lab results as soon as they are  available. The fastest way to get your results is to activate your My Chart account. Instructions are located on the last page of  this paperwork. If you have not heard from [name] regarding the results in 2 weeks, please contact this office.       I personally performed the services described in this documentation, which was scribed in my presence. The recorded information has been reviewed and considered, and addended by me as needed.   Signed,   Merri Ray, MD [facility].  05/12/16 12:14 PM

## 2016-05-23 ENCOUNTER — Encounter: Payer: Self-pay | Admitting: Family Medicine

## 2016-05-24 DIAGNOSIS — S83242A Other tear of medial meniscus, current injury, left knee, initial encounter: Secondary | ICD-10-CM | POA: Diagnosis not present

## 2016-05-31 ENCOUNTER — Other Ambulatory Visit: Payer: Self-pay | Admitting: Family Medicine

## 2016-05-31 DIAGNOSIS — F4323 Adjustment disorder with mixed anxiety and depressed mood: Secondary | ICD-10-CM

## 2016-06-07 ENCOUNTER — Ambulatory Visit (INDEPENDENT_AMBULATORY_CARE_PROVIDER_SITE_OTHER): Payer: PPO | Admitting: Family Medicine

## 2016-06-07 ENCOUNTER — Telehealth: Payer: Self-pay

## 2016-06-07 VITALS — BP 122/72 | HR 63 | Temp 98.0°F | Resp 17 | Ht 70.5 in | Wt 150.0 lb

## 2016-06-07 DIAGNOSIS — E785 Hyperlipidemia, unspecified: Secondary | ICD-10-CM

## 2016-06-07 LAB — COMPLETE METABOLIC PANEL WITH GFR
ALT: 17 U/L (ref 6–29)
AST: 18 U/L (ref 10–35)
Albumin: 4 g/dL (ref 3.6–5.1)
Alkaline Phosphatase: 60 U/L (ref 33–130)
BUN: 17 mg/dL (ref 7–25)
CHLORIDE: 106 mmol/L (ref 98–110)
CO2: 28 mmol/L (ref 20–31)
CREATININE: 0.66 mg/dL (ref 0.50–0.99)
Calcium: 8.8 mg/dL (ref 8.6–10.4)
GFR, Est Non African American: >89 mL/min (ref >=60)
Glucose, Bld: 94 mg/dL (ref 65–99)
Potassium: 3.9 mmol/L (ref 3.5–5.3)
Sodium: 141 mmol/L (ref 135–146)
Total Bilirubin: 0.6 mg/dL (ref 0.2–1.2)
Total Protein: 6.3 g/dL (ref 6.1–8.1)

## 2016-06-07 LAB — LIPID PANEL
Cholesterol: 182 mg/dL (ref 125–200)
HDL: 58 mg/dL (ref >=46)
LDL CALC: 107 mg/dL (ref <130)
TRIGLYCERIDES: 85 mg/dL (ref <150)
Total CHOL/HDL Ratio: 3.1 Ratio (ref <=5.0)
VLDL: 17 mg/dL (ref <30)

## 2016-06-07 MED ORDER — ATORVASTATIN CALCIUM 40 MG PO TABS: 40.0000 mg | ORAL_TABLET | Freq: Every day | ORAL | 3 refills | Status: DC

## 2016-06-07 NOTE — Patient Instructions (Addendum)
  No change in meds for now.  Follow up in next 6 months, but let me know if med refills needed sooner.    IF you received an x-ray today, you will receive an invoice from [facility]. Please contact [facility] at [phone] with questions or concerns regarding your invoice.   IF you received labwork today, you will receive an invoice from [employer]. Please contact [facility] at [phone] with questions or concerns regarding your invoice.   Our billing staff will not be able to assist you with questions regarding bills from these companies.  You will be contacted with the lab results as soon as they are available. The fastest way to get your results is to activate your My Chart account. Instructions are located on the last page of this paperwork. If you have not heard from [name] regarding the results in 2 weeks, please contact this office.

## 2016-06-07 NOTE — Telephone Encounter (Signed)
Pt left prescription and AVS.  Pt states she does not need info.  Does not want written rx until after Dr. Carlota Raspberry sees her labs done today.   Rx canceled.

## 2016-06-07 NOTE — Progress Notes (Signed)
By signing my name below, I, Mesha Guinyard, attest that this documentation has been prepared under the direction and in the presence of Merri Ray, MD.  Electronically Signed: Verlee Monte, Medical Scribe. 06/07/16. 8:38 AM.  Subjective:    Patient ID: Erika Johns, female    DOB: 1951/04/18, 66 y.o.   MRN: [id number]  HPI Chief Complaint  Patient presents with  . Medication Refill    lipitor     HPI Comments: Erika Johns is a 65 y.o. female who presents to the [facility] for medication refill for lipitor. She was just seen a few weeks ago. We discussed anxiety and depression at that time and unfortunately her husband has pasted since that visit. He was involved with hospice at that time. Pt is fasting.  Depression/Anxiety: Takes Zoloft 100mg  QD, klonopin .5mg  PRN but mainly QHS and Vivia Budge is her therapist. Pt mentions she thinks she's coming down with a cold and her sleeping pattern is "erratic"; sleeping at either 12am, or 11pm while waking up shortly after and can't go back to sleep.  Pt went to the hospice last night to talk about grief, what to expect, and how to cope. Pt went to her grandkids birthday party. Pt mentions there will be times where she wants to tell her husband about her day but she can't, and his service is next week. She was frustrated with his family for not being as present in his life, but they came back in his life during his final days. Pt has a 61 y/o mother that isn't doing well and her sister helps her. Pt saw Marjory Lies last week.  Knee Pain/ Sore Toe: Pt was told she's getting a MRI of hr knee by Dr. Noemi Chapel. Pt mentions a sore toe and she had it buddy tapped when she had it checked out by her orthopedic.wearing knee brace with min relief.   HLD: Takes lipitor 40 mg. Pt denies experiencing any side effects from her medication. Lab Results  Component Value Date   CHOL 162 05/31/2015   HDL 71 05/31/2015   LDLCALC 76 05/31/2015   TRIG 73 05/31/2015   CHOLHDL 2.3 05/31/2015   Lab Results  Component Value Date   ALT 13 05/31/2015   AST 15 05/31/2015   ALKPHOS 59 05/31/2015   BILITOT 0.6 05/31/2015   Patient Active Problem List   Diagnosis Date Noted  . KNEE PAIN 08/27/2009  . LUMBAGO 08/27/2009  . 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 HAND PAIN, LEFT 08/27/2009   Past Medical History:  Diagnosis Date  . Allergy   . Anxiety   . Arthritis   . Constipation, chronic   . Depression   . IBS (irritable bowel syndrome)   . Insomnia   . PONV (postoperative nausea and vomiting)    severe n/v-needs scop   Past Surgical History:  Procedure Laterality Date  . COLONOSCOPY    . DILATION AND CURETTAGE OF UTERUS  2006  . FRACTURE SURGERY  1996   fx tib-fib-rt  . HARDWARE REMOVAL  1996   rt ankle  . KNEE ARTHROSCOPY  2003   lt  . KNEE ARTHROSCOPY     right  . ORIF CLAVICULAR FRACTURE Right 05/28/2013   Procedure: OPEN REDUCTION INTERNAL FIXATION (ORIF) CLAVICULAR FRACTURE;  Surgeon: Renette Butters, MD;  Location: [place];  Service: Orthopedics;  Laterality: Right;  . TONSILLECTOMY     Allergies  Allergen Reactions  . Effexor [Venlafaxine Hydrochloride]  Other (See Comments)    dizzy  . Hydrocodone Nausea And Vomiting  . Ampicillin Rash   Prior to Admission medications   Medication Sig Start Date End Date Taking? Authorizing Provider  Ascorbic Acid (VITAMIN C) 1000 MG tablet Take 1,000 mg by mouth daily.   Yes Historical Provider, MD  atorvastatin (LIPITOR) 40 MG tablet Take 1 tablet (40 mg total) by mouth every other day. 05/31/15  Yes Robyn Haber, MD  Cholecalciferol (VITAMIN D3) 1000 UNITS CAPS Take by mouth daily.   Yes Historical Provider, MD  clonazePAM (KLONOPIN) 0.5 MG tablet Take 0.5-1 tablets (0.25-0.5 mg total) by mouth 2 (two) times daily as needed for anxiety. 05/12/16  Yes [name], MD  diclofenac sodium (VOLTAREN) 1 % GEL Apply topically 4  (four) times daily.   Yes Historical Provider, MD  fluticasone (FLONASE) 50 MCG/ACT nasal spray Place 2 sprays into both nostrils daily. 10/14/15  Yes [name], MD  glucosamine-chondroitin 500-400 MG tablet Take 1 tablet by mouth. 2 gel caps at breakfast   Yes Historical Provider, MD  hydrocortisone (ANUSOL-HC) 2.5 % rectal cream Place 1 application rectally 2 (two) times daily as needed for hemorrhoids or itching. 11/04/15  Yes [name], MD  Lidocaine, Anorectal, 5 % GEL Apply 1 application topically 2 (two) times daily as needed. 11/04/15  Yes [name], MD  meloxicam (MOBIC) 7.5 MG tablet Take 7.5 mg by mouth daily.   Yes Historical Provider, MD  naftifine (NAFTIN) 1 % cream Apply topically daily as needed. 10/14/15  Yes [name], MD  Omega-3 Fatty Acids (FISH OIL) 1000 MG CAPS Take by mouth.   Yes Historical Provider, MD  Polyethylene Glycol 3350 (MIRALAX PO) Take by mouth.   Yes Historical Provider, MD  sertraline (ZOLOFT) 100 MG tablet TAKE 1 TABLET (100 MG TOTAL) BY MOUTH DAILY 05/12/16  Yes [name], MD  traMADol (ULTRAM) 50 MG tablet Take 1 tablet (50 mg total) by mouth every 8 (eight) hours as needed. 01/12/15  Yes [name], MD  valACYclovir (VALTREX) 1000 MG tablet Take 2 tabs twice a day for 2 doses for flareups of Cold sores 12/07/12  Yes Darlyne Russian, MD   Social History   Social History  . Marital status: Married    Spouse name: N/A  . Number of children: N/A  . Years of education: N/A   Occupational History  . Not on file.   Social History Main Topics  . Smoking status: Never Smoker  . Smokeless tobacco: Never Used  . Alcohol use 0.0 oz/week  . Drug use: No  . Sexual activity: Yes   Other Topics Concern  . Not on file   Social History Narrative  . No narrative on file   Review of Systems  Constitutional: Negative for fatigue and unexpected weight change.  Respiratory: Negative for chest tightness and shortness of breath.    Cardiovascular: Negative for chest pain, palpitations and leg swelling.  Gastrointestinal: Negative for abdominal pain and blood in stool.  Musculoskeletal: Positive for arthralgias (knee). Negative for myalgias.  Neurological: Negative for dizziness, syncope, light-headedness and headaches.  Psychiatric/Behavioral: Positive for sleep disturbance.   Objective:  Physical Exam  Constitutional: She is oriented to person, place, and time. She appears well-developed and well-nourished.  HENT:  Head: Normocephalic and atraumatic.  Eyes: Conjunctivae and EOM are normal. Pupils are equal, round, and reactive to light.  Neck: Carotid bruit is not present.  Cardiovascular: Normal rate,  regular rhythm, normal heart sounds and intact distal pulses.   Pulmonary/Chest: Effort normal and breath sounds normal.  Abdominal: Soft. She exhibits no pulsatile midline mass. There is no tenderness.  Neurological: She is alert and oriented to person, place, and time.  Skin: Skin is warm and dry.  Psychiatric: She has a normal mood and affect. Her behavior is normal.  Vitals reviewed.  BP 122/72 (BP Location: Right Arm, Patient Position: Sitting, Cuff Size: Normal)   Pulse 63   Temp 98 F (36.7 C) (Oral)   Resp 17   Ht 5' 10.5" (1.791 m)   Wt 150 lb (68 kg)   SpO2 99%   BMI 21.22 kg/m  Assessment & Plan:   PATTIANN SCIPIO is a 65 y.o. female Hyperlipidemia, unspecified hyperlipidemia type - Plan: COMPLETE METABOLIC PANEL WITH GFR, Lipid panel, atorvastatin (LIPITOR) 40 MG tablet  -Lowering Lipitor current dose, check CMP, check lipid panel. Refilled medications.  Appears to be doing well with grieving process, continue counseling, let me know if I can help.  Keep follow-up with orthopedics regarding knee pain, and planned MRI.    Meds ordered this encounter  Medications  . atorvastatin (LIPITOR) 40 MG tablet    Sig: Take 1 tablet (40 mg total) by mouth daily at 6 PM.    Dispense:  90  tablet    Refill:  3   Patient Instructions    No change in meds for now.  Follow up in next 6 months, but let me know if med refills needed sooner.    IF you received an x-ray today, you will receive an invoice from [facility]. Please contact [facility] at [phone] with questions or concerns regarding your invoice.   IF you received labwork today, you will receive an invoice from [employer]. Please contact [facility] at [phone] with questions or concerns regarding your invoice.   Our billing staff will not be able to assist you with questions regarding bills from these companies.  You will be contacted with the lab results as soon as they are available. The fastest way to get your results is to activate your My Chart account. Instructions are located on the last page of this paperwork. If you have not heard from [name] regarding the results in 2 weeks, please contact this office.        I personally performed the services described in this documentation, which was scribed in my presence. The recorded information has been reviewed and considered, and addended by me as needed.   Signed,   Merri Ray, MD [facility].  06/07/16 9:15 AM

## 2016-06-13 ENCOUNTER — Encounter: Payer: Self-pay | Admitting: Family Medicine

## 2016-06-13 DIAGNOSIS — Z124 Encounter for screening for malignant neoplasm of cervix: Secondary | ICD-10-CM | POA: Diagnosis not present

## 2016-06-14 DIAGNOSIS — M25562 Pain in left knee: Secondary | ICD-10-CM | POA: Diagnosis not present

## 2016-06-21 DIAGNOSIS — M1712 Unilateral primary osteoarthritis, left knee: Secondary | ICD-10-CM | POA: Diagnosis not present

## 2016-06-26 ENCOUNTER — Encounter: Payer: Self-pay | Admitting: Family Medicine

## 2016-07-04 ENCOUNTER — Encounter: Payer: Self-pay | Admitting: Family Medicine

## 2016-07-04 ENCOUNTER — Other Ambulatory Visit: Payer: Self-pay | Admitting: Family Medicine

## 2016-07-04 DIAGNOSIS — E785 Hyperlipidemia, unspecified: Secondary | ICD-10-CM

## 2016-07-05 MED ORDER — ATORVASTATIN CALCIUM 40 MG PO TABS: 40.0000 mg | ORAL_TABLET | Freq: Every day | ORAL | 1 refills | Status: DC

## 2016-07-05 MED ORDER — ATORVASTATIN CALCIUM 40 MG PO TABS: 40.0000 mg | ORAL_TABLET | ORAL | 1 refills | Status: DC

## 2016-07-08 ENCOUNTER — Telehealth: Payer: Self-pay | Admitting: Family Medicine

## 2016-07-08 NOTE — Telephone Encounter (Signed)
Preoperative clearance form received from [facility] orthopedics. Will need to have an office visit to discuss that surgery and determine if other testing needed in the office. Specifically may need updated chest x-ray, possible EKG and to discuss if other testing needed prior to surgery.

## 2016-07-29 ENCOUNTER — Telehealth: Payer: Self-pay

## 2016-07-30 ENCOUNTER — Telehealth: Payer: Self-pay | Admitting: Emergency Medicine

## 2016-07-30 NOTE — Telephone Encounter (Signed)
Please see prior note on 11/17 regarding surgical clearance.

## 2016-07-30 NOTE — Telephone Encounter (Signed)
Pt cancelled knee surgery at this time States, her life is very busy right now taking care of sick sister and grieving after husband death. She will f/u with you after getting settled. She wanted you to know, she stopped taking Klonopin. She is sleeping well.

## 2016-09-02 ENCOUNTER — Other Ambulatory Visit (HOSPITAL_COMMUNITY): Payer: Self-pay

## 2016-09-12 ENCOUNTER — Inpatient Hospital Stay: Admit: 2016-09-12 | Payer: Self-pay | Admitting: Orthopedic Surgery

## 2016-09-12 SURGERY — ARTHROPLASTY, KNEE, TOTAL
Anesthesia: Spinal | Laterality: Left

## 2016-09-27 ENCOUNTER — Ambulatory Visit (INDEPENDENT_AMBULATORY_CARE_PROVIDER_SITE_OTHER): Payer: PPO | Admitting: Family Medicine

## 2016-09-27 VITALS — BP 122/72 | HR 71 | Temp 98.9°F | Resp 17 | Ht 70.0 in | Wt 147.0 lb

## 2016-09-27 DIAGNOSIS — F418 Other specified anxiety disorders: Secondary | ICD-10-CM

## 2016-09-27 DIAGNOSIS — Z23 Encounter for immunization: Secondary | ICD-10-CM

## 2016-09-27 DIAGNOSIS — F439 Reaction to severe stress, unspecified: Secondary | ICD-10-CM

## 2016-09-27 DIAGNOSIS — G8929 Other chronic pain: Secondary | ICD-10-CM

## 2016-09-27 DIAGNOSIS — E785 Hyperlipidemia, unspecified: Secondary | ICD-10-CM

## 2016-09-27 DIAGNOSIS — M25571 Pain in right ankle and joints of right foot: Secondary | ICD-10-CM

## 2016-09-27 DIAGNOSIS — M25562 Pain in left knee: Secondary | ICD-10-CM | POA: Diagnosis not present

## 2016-09-27 MED ORDER — SERTRALINE HCL 50 MG PO TABS: 50.0000 mg | ORAL_TABLET | Freq: Every day | ORAL | 1 refills | Status: DC

## 2016-09-27 NOTE — Patient Instructions (Addendum)
  Decrease zoloft to 50g.  If you note increasing depression or anxiety symptoms - can return to 100mg  dose (2 pills), but let me know so I can send in the new dose. For now, I sent in 50mg  dose.   If you plan to have surgery on the knee - return for preop evaluation.   Ok to continue lipitor every other day and return before that runs out to recheck levels.   Decreased motion of your great toe is likely due to some arthritis, but we can always check an x-ray if you would like to make sure there was not a subtle fracture that did not heal appropriately from September. Just let me know. I can also refer you to a foot specialist if needed.  IF you received an x-ray today, you will receive an invoice from [facility]. Please contact [facility] Radiology at [phone] with questions or concerns regarding your invoice.   IF you received labwork today, you will receive an invoice from [facility]. Please contact [facility] at [phone] with questions or concerns regarding your invoice.   Our billing staff will not be able to assist you with questions regarding bills from these companies.  You will be contacted with the lab results as soon as they are available. The fastest way to get your results is to activate your My Chart account. Instructions are located on the last page of this paperwork. If you have not heard from [name] regarding the results in 2 weeks, please contact this office.

## 2016-09-27 NOTE — Progress Notes (Signed)
By signing my name below, I, Erika Johns, attest that this documentation has been prepared under the direction and in the presence of Erika Ray, MD.  Electronically Signed: Verlee Monte, Medical Scribe. 09/27/16. 11:31 AM.  Subjective:    Patient ID: Erika Johns, female    DOB: 1950/09/22, 66 y.o.   MRN: [id number]  HPI Chief Complaint  Patient presents with  . Medication Refill    zoloft     HPI Comments: Erika Johns is a 66 y.o. female who presents to the [facility] for zoloft refill. She's used this for anxiety and depression. Her husband passed away 05/23/16 after a prolonged illness. Last office visit 10/17th she was on 100 mg of zoloft with klonopin PEN. Therapist Vivia Budge, she also met up with hospice to discuss grief, appeared to be overall stable with grieving process.  Depression Screening: Pt was in [place] for Christmas for 2 months, and she has been taking 50 mg of zoloft. Pt is doing better and enjoyed not taking care of anyone but herself. She continues to speak with Vivia Budge and hospice every Thursday for the past 7 weeks. Pt rarely takes klonopin, and it's QHS. Pt's mom turned 54 y/o and she's on call for her passing for anytime. Pt's friend also passed and she plans on traveling to take care of the estate. Pt mentions she's an extrovert and she reaches out to people if she needs it but suspects she may have some under the radar dysphoric mood since she still misses him. Depression screen [facility] 2/9 09/27/2016 06/07/2016 05/12/2016 10/21/2015 10/14/2015  Decreased Interest 0 0 0 0 0  Down, Depressed, Hopeless 0 0 0 0 0  PHQ - 2 Score 0 0 0 0 0   HLD: Takes lipitor every other day. Compliant with her medication. Lab Results  Component Value Date   CHOL 182 06/07/2016   HDL 58 06/07/2016   LDLCALC 107 06/07/2016   TRIG 85 06/07/2016   CHOLHDL 3.1 06/07/2016   Health Maintenance: She is due for pneumo vac. She  has had prevnar, and other immunizations are UTD. Immunization History  Administered Date(s) Administered  . Influenza Split 04/28/2012, 05/22/2013, 06/05/2014  . Influenza,inj,Quad PF,36+ Mos 05/12/2016  . Influenza-Unspecified 05/25/2015  . Pneumococcal Conjugate-13 01/12/2015, 05/31/2015  . Td 11/22/2007  . Zoster 06/22/2010   Left Knee Pain: Planning for possible left knee replacement. Surgical clearance form provided in [place], but based on the phone note in December, she wanted to delay the surgery until a better time. Pt takes 1 tramadol PRN for her knee pain, which is rarely. Her knee doesn't hurt as bad and she's able to ambulate without a brace - she no longer wears brace. Pt reports some pain with child pose and occasionally when she lays in bed. Pt states she doesn't have time to get knee surgery.  Right ankle Swelling: Reports ankle swelling while in [place].  Right Toe Pain: Her right toe no longer moves and she recalls injured it 04/2016. Pt states it bothers her less but it limits some yoga poses. Deferred great toe x-Johns today.  Patient Active Problem List   Diagnosis Date Noted  . KNEE PAIN 08/27/2009  . LUMBAGO 08/27/2009  . 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 HAND PAIN, LEFT 08/27/2009   Past Medical History:  Diagnosis Date  . Allergy   . Anxiety   . Arthritis   . Constipation, chronic   . Depression   . IBS (  irritable bowel syndrome)   . Insomnia   . PONV (postoperative nausea and vomiting)    severe n/v-needs scop   Past Surgical History:  Procedure Laterality Date  . COLONOSCOPY    . DILATION AND CURETTAGE OF UTERUS  2006  . FRACTURE SURGERY  1996   fx [id number]-rt  . HARDWARE REMOVAL  1996   rt ankle  . KNEE ARTHROSCOPY  2003   lt  . KNEE ARTHROSCOPY     right  . ORIF CLAVICULAR FRACTURE Right 05/28/2013   Procedure: OPEN REDUCTION INTERNAL FIXATION (ORIF) CLAVICULAR FRACTURE;  Surgeon: Renette Butters, MD;  Location: [facility];   Service: Orthopedics;  Laterality: Right;  . TONSILLECTOMY     Allergies  Allergen Reactions  . Effexor [Venlafaxine Hydrochloride] Other (See Comments)    dizzy  . Hydrocodone Nausea And Vomiting  . Ampicillin Rash   Prior to Admission medications   Medication Sig Start Date End Date Taking? Authorizing Provider  Ascorbic Acid (VITAMIN C) 1000 MG tablet Take 1,000 mg by mouth daily.   Yes Historical Provider, MD  atorvastatin (LIPITOR) 40 MG tablet Take 1 tablet (40 mg total) by mouth every other day. 07/05/16  Yes [name], MD  Cholecalciferol (VITAMIN D3) 1000 UNITS CAPS Take by mouth daily.   Yes Historical Provider, MD  clonazePAM (KLONOPIN) 0.5 MG tablet Take 0.5-1 tablets (0.25-0.5 mg total) by mouth 2 (two) times daily as needed for anxiety. 05/12/16  Yes [name], MD  diclofenac sodium (VOLTAREN) 1 % GEL Apply topically 4 (four) times daily.   Yes Historical Provider, MD  fluticasone (FLONASE) 50 MCG/ACT nasal spray Place 2 sprays into both nostrils daily. 10/14/15  Yes [name], MD  glucosamine-chondroitin 500-400 MG tablet Take 1 tablet by mouth. 2 gel caps at breakfast   Yes Historical Provider, MD  hydrocortisone (ANUSOL-HC) 2.5 % rectal cream Place 1 application rectally 2 (two) times daily as needed for hemorrhoids or itching. 11/04/15  Yes [name], MD  Lidocaine, Anorectal, 5 % GEL Apply 1 application topically 2 (two) times daily as needed. 11/04/15  Yes [name], MD  naftifine (NAFTIN) 1 % cream Apply topically daily as needed. 10/14/15  Yes [name], MD  Omega-3 Fatty Acids (FISH OIL) 1000 MG CAPS Take by mouth.   Yes Historical Provider, MD  Polyethylene Glycol 3350 (MIRALAX PO) Take by mouth.   Yes Historical Provider, MD  sertraline (ZOLOFT) 100 MG tablet TAKE 1 TABLET (100 MG TOTAL) BY MOUTH DAILY 05/12/16  Yes [name], MD  traMADol (ULTRAM) 50 MG tablet Take 1 tablet (50 mg total) by mouth every 8 (eight) hours as  needed. 01/12/15  Yes [name], MD  valACYclovir (VALTREX) 1000 MG tablet Take 2 tabs twice a day for 2 doses for flareups of Cold sores 12/07/12  Yes Darlyne Russian, MD   Social History   Social History  . Marital status: Widowed    Spouse name: N/A  . Number of children: N/A  . Years of education: N/A   Occupational History  . Not on file.   Social History Main Topics  . Smoking status: Never Smoker  . Smokeless tobacco: Never Used  . Alcohol use 0.0 oz/week  . Drug use: No  . Sexual activity: Yes   Other Topics Concern  . Not on file   Social History Narrative  . No narrative on file   Review of Systems  Musculoskeletal:  Positive for arthralgias and joint swelling.  Psychiatric/Behavioral: Positive for dysphoric mood.   Objective:  Physical Exam  Constitutional: She appears well-developed and well-nourished. No distress.  HENT:  Head: Normocephalic and atraumatic.  Eyes: Conjunctivae are normal.  Neck: Neck supple.  Cardiovascular: Normal rate.   Pulmonary/Chest: Effort normal.  Musculoskeletal: She exhibits no edema (lower extremity).  Decrease flexion of the DIP Able to flex against resistant of the great toe but minimal flexion of the toe No focal bony tenderness Calf non tendner Left knee FROM No focal bony tenderness  Neurological: She is alert.  Skin: Skin is warm and dry.  Psychiatric: She has a normal mood and affect. Her behavior is normal.  Nursing note and vitals reviewed.  BP 122/72 (BP Location: Right Arm, Patient Position: Sitting, Cuff Size: Normal)   Pulse 71   Temp 98.9 F (37.2 C) (Oral)   Resp 17   Ht _0  (1.778 m)   Wt 147 lb (66.7 kg)   SpO2 95%   BMI 21.09 kg/m  Assessment & Plan:   AALIAH JORGENSON is a 66 y.o. female Depression with anxiety - Plan: sertraline (ZOLOFT) 50 MG tablet Situational stress - Plan: sertraline (ZOLOFT) 50 MG tablet  - Appears to be coping well with grieving process. She feels she can  tolerate a lower dose of Zoloft. Like to try 50 mg dose, so doses changed. Advised if increased depression or anxiety symptoms, return to 2 pills per day, and let me know and I will return her to the 100 mg dose. Continue Klonopin if needed for breakthrough symptoms, but recently has been doing well.  Need for prophylactic vaccination against Streptococcus pneumoniae (pneumococcus) - Plan: Pneumococcal polysaccharide vaccine 23-valent greater than or equal to 2yo subcutaneous/IM  Hyperlipidemia, unspecified hyperlipidemia type  -Okay to continue Lipitor every other day for now, follow-up prior to expiration of this prescription for repeat levels.  Chronic pain of left knee  - Overall functionally doing well. Advised if she does consider knee replacement surgery, to return for preop evaluation.  Arthralgia of toe of right foot  - Previous x-Johns without fracture, possible degenerative changes versus occult fracture with surrounding consultation. Repeat x-Johns discussed, declined at this time. RTC precautions  Meds ordered this encounter  Medications  . sertraline (ZOLOFT) 50 MG tablet    Sig: Take 1 tablet (50 mg total) by mouth daily. TAKE 1 TABLET (100 MG TOTAL) BY MOUTH DAILY    Dispense:  90 tablet    Refill:  1   Patient Instructions    Decrease zoloft to 50g.  If you note increasing depression or anxiety symptoms - can return to 113m dose (2 pills), but let me know so I can send in the new dose. For now, I sent in 542mdose.   If you plan to have surgery on the knee - return for preop evaluation.   Ok to continue lipitor every other day and return before that runs out to recheck levels.   Decreased motion of your great toe is likely due to some arthritis, but we can always check an x-Johns if you would like to make sure there was not a subtle fracture that did not heal appropriately from September. Just let me know. I can also refer you to a foot specialist if needed.  IF you  received an x-Johns today, you will receive an invoice from [facility]. Please contact [facility] at [phone] questions or concerns regarding your invoice.  IF you received labwork today, you will receive an invoice from [place]. Please contact [facility] at [phone] with questions or concerns regarding your invoice.   Our billing staff will not be able to assist you with questions regarding bills from these companies.  You will be contacted with the lab results as soon as they are available. The fastest way to get your results is to activate your My Chart account. Instructions are located on the last page of this paperwork. If you have not heard from [name] regarding the results in 2 weeks, please contact this office.       I personally performed the services described in this documentation, which was scribed in my presence. The recorded information has been reviewed and considered for accuracy and completeness, addended by me as needed, and agree with information above.  Signed,   Erika Ray, MD [facility].  09/28/16 12:51 PM

## 2016-09-30 ENCOUNTER — Telehealth: Payer: Self-pay

## 2016-09-30 DIAGNOSIS — F439 Reaction to severe stress, unspecified: Secondary | ICD-10-CM

## 2016-09-30 DIAGNOSIS — F418 Other specified anxiety disorders: Secondary | ICD-10-CM

## 2016-09-30 NOTE — Telephone Encounter (Signed)
Patient needs someone to get in touch with [facility] pharmacy to see about her Zoloft prescription. Something about the way Dr Carlota Raspberry has it worded they need clarification on. The things is the patient took her last pill on 09/30/16 and doesn't have any and it is going to take a few days to get them to her since it is a mail order pharmacy.  Her call back number is [phone]

## 2016-10-01 MED ORDER — SERTRALINE HCL 50 MG PO TABS: 50.0000 mg | ORAL_TABLET | Freq: Every day | ORAL | 1 refills | Status: DC

## 2016-10-01 NOTE — Telephone Encounter (Signed)
Per ov note decreased to 50mg  qd for now but written 50 and 100mg  qd I changed to 50 mg qd and re sent.

## 2016-10-01 NOTE — Telephone Encounter (Signed)
fyi

## 2016-10-02 ENCOUNTER — Encounter: Payer: Self-pay | Admitting: Family Medicine

## 2016-10-03 ENCOUNTER — Encounter: Payer: Self-pay | Admitting: Family Medicine

## 2016-12-15 ENCOUNTER — Ambulatory Visit (INDEPENDENT_AMBULATORY_CARE_PROVIDER_SITE_OTHER): Payer: PPO | Admitting: Physician Assistant

## 2016-12-15 VITALS — BP 135/76 | HR 60 | Temp 98.8°F | Resp 16 | Ht 70.0 in | Wt 145.2 lb

## 2016-12-15 DIAGNOSIS — H6982 Other specified disorders of Eustachian tube, left ear: Secondary | ICD-10-CM

## 2016-12-15 MED ORDER — CETIRIZINE HCL 10 MG PO TABS: 10.0000 mg | ORAL_TABLET | Freq: Every day | ORAL | 0 refills | Status: DC

## 2016-12-15 MED ORDER — PSEUDOEPHEDRINE HCL 60 MG PO TABS: 60.0000 mg | ORAL_TABLET | Freq: Two times a day (BID) | ORAL | 0 refills | Status: AC

## 2016-12-15 NOTE — Patient Instructions (Addendum)
Please use low dose sudafed, zyrtec, and flonase consistently over the next 3-5 days. If no improvement or symptoms worsen please return or contact our office. Thank you for letting me participate in your health and well being.   Eustachian Tube Dysfunction The eustachian tube connects the middle ear to the back of the nose. It regulates air pressure in the middle ear by allowing air to move between the ear and nose. It also helps to drain fluid from the middle ear space. When the eustachian tube does not function properly, air pressure, fluid, or both can build up in the middle ear. Eustachian tube dysfunction can affect one or both ears. What are the causes? This condition happens when the eustachian tube becomes blocked or cannot open normally. This may result from:  Ear infections.  Colds and other upper respiratory infections.  Allergies.  Irritation, such as from cigarette smoke or acid from the stomach coming up into the esophagus (gastroesophageal reflux).  Sudden changes in air pressure, such as from descending in an airplane.  Abnormal growths in the nose or throat, such as nasal polyps, tumors, or enlarged tissue at the back of the throat (adenoids). What increases the risk? This condition may be more likely to develop in people who smoke and people who are overweight. Eustachian tube dysfunction may also be more likely to develop in children, especially children who have:  Certain birth defects of the mouth, such as cleft palate.  Large tonsils and adenoids. What are the signs or symptoms? Symptoms of this condition may include:  A feeling of fullness in the ear.  Ear pain.  Clicking or popping noises in the ear.  Ringing in the ear.  Hearing loss.  Loss of balance. Symptoms may get worse when the air pressure around you changes, such as when you travel to an area of high elevation or fly on an airplane. How is this diagnosed? This condition may be diagnosed  based on:  Your symptoms.  A physical exam of your ear, nose, and throat.  Tests, such as those that measure:  The movement of your eardrum (tympanogram).  Your hearing (audiometry). How is this treated? Treatment depends on the cause and severity of your condition. If your symptoms are mild, you may be able to relieve your symptoms by moving air into ("popping") your ears. If you have symptoms of fluid in your ears, treatment may include:  Decongestants.  Antihistamines.  Nasal sprays or ear drops that contain medicines that reduce swelling (steroids). In some cases, you may need to have a procedure to drain the fluid in your eardrum (myringotomy). In this procedure, a small tube is placed in the eardrum to:  Drain the fluid.  Restore the air in the middle ear space. Follow these instructions at home:  Take over-the-counter and prescription medicines only as told by your health care provider.  Use techniques to help pop your ears as recommended by your health care provider. These may include:  Chewing gum.  Yawning.  Frequent, forceful swallowing.  Closing your mouth, holding your nose closed, and gently blowing as if you are trying to blow air out of your nose.  Do not do any of the following until your health care provider approves:  Travel to high altitudes.  Fly in airplanes.  Work in a Pension scheme manager or room.  Scuba dive.  Keep your ears dry. Dry your ears completely after showering or bathing.  Do not smoke.  Keep all follow-up visits as  told by your health care provider. This is important. Contact a health care provider if:  Your symptoms do not go away after treatment.  Your symptoms come back after treatment.  You are unable to pop your ears.  You have:  A fever.  Pain in your ear.  Pain in your head or neck.  Fluid draining from your ear.  Your hearing suddenly changes.  You become very dizzy.  You lose your balance. This  information is not intended to replace advice given to you by your health care provider. Make sure you discuss any questions you have with your health care provider. Document Released: 09/04/2015 Document Revised: 01/14/2016 Document Reviewed: 08/27/2014 Elsevier Interactive Patient Education  2017 [employer].   IF you received an x-ray today, you will receive an invoice from [facility]. Please contact [facility] at [phone] with questions or concerns regarding your invoice.   IF you received labwork today, you will receive an invoice from [place]. Please contact [facility] at [phone] with questions or concerns regarding your invoice.   Our billing staff will not be able to assist you with questions regarding bills from these companies.  You will be contacted with the lab results as soon as they are available. The fastest way to get your results is to activate your My Chart account. Instructions are located on the last page of this paperwork. If you have not heard from [name] regarding the results in 2 weeks, please contact this office.

## 2016-12-15 NOTE — Progress Notes (Signed)
MRN: [id number] DOB: Aug 09, 1951  Subjective:   Erika Johns is a 66 y.o. female presenting for chief complaint of Sinus Problem (cough, head pressure, drainage) .  Reports 1 week history of sinus pressure, sinus congestion, ear fullness and dry cough (no hemoptysis). Has tried salt water gargles, nasal normal saline and vitamin c with no full relief.  States she has been traveling a lot on airplanes lately and under a lot of stress. Has not been able to get her left ear to fully pop since she got off the plane a few days ago. Denies fever, ear drainage, sore throat, wheezing, productive cough, shortness of breath, myalgias,night sweats, chills, nausea, vomiting, abdominal pain and diarrhea. Has had sick contact with people on planes and family members. No history of seasonal allergies, history of asthma. Denies smoking. Denies any other aggravating or relieving factors, no other questions or concerns.  Erika Johns has a current medication list which includes the following prescription(s): vitamin c, atorvastatin, vitamin d3, clonazepam, diclofenac sodium, docosahexaenoic acid, fluticasone, glucosamine-chondroitin, sertraline, and tamsulosin. Also is allergic to effexor [venlafaxine hydrochloride]; hydrocodone; and ampicillin.  [name]  has a past medical history of Allergy; Anxiety; Arthritis; Constipation, chronic; Depression; IBS (irritable bowel syndrome); Insomnia; and PONV (postoperative nausea and vomiting). Also  has a past surgical history that includes Knee arthroscopy (2003); Knee arthroscopy; Fracture surgery (1996); Hardware Removal (1996); Dilation and curettage of uterus (2006); Tonsillectomy; Colonoscopy; and ORIF clavicular fracture (Right, 05/28/2013).   Objective:   Vitals: BP 135/76   Pulse 60   Temp 98.8 F (37.1 C) (Oral)   Resp 16   Ht 5\' 10"  (1.778 m)   Wt 145 lb 3.2 oz (65.9 kg)   SpO2 96%   BMI 20.83 kg/m   Physical Exam  Constitutional: She is oriented to  person, place, and time. She appears well-developed and well-nourished.  HENT:  Head: Normocephalic and atraumatic.  Right Ear: Tympanic membrane, external ear and ear canal normal.  Left Ear: External ear and ear canal normal. There is drainage (cerumen blocking view of TM).  Nose: Mucosal edema (moderate, bilaterally) present. Right sinus exhibits no maxillary sinus tenderness and no frontal sinus tenderness. Left sinus exhibits no maxillary sinus tenderness and no frontal sinus tenderness.  Mouth/Throat: Uvula is midline and mucous membranes are normal. Posterior oropharyngeal erythema present. No tonsillar exudate.  Eyes: Conjunctivae are normal.  Neck: Normal range of motion.  Cardiovascular: Normal rate, regular rhythm and normal heart sounds.   Pulmonary/Chest: Effort normal and breath sounds normal. She has no wheezes. She has no rales.  Lymphadenopathy:       Head (right side): No submental, no submandibular, no tonsillar, no preauricular, no posterior auricular and no occipital adenopathy present.       Head (left side): No submental, no submandibular, no tonsillar, no preauricular, no posterior auricular and no occipital adenopathy present.    She has no cervical adenopathy.       Right: No supraclavicular adenopathy present.       Left: No supraclavicular adenopathy present.  Neurological: She is alert and oriented to person, place, and time.  Skin: Skin is warm and dry.  Psychiatric: She has a normal mood and affect.  Vitals reviewed.  Post cerumen removal with ear curette, TM visualized, no erythema or bulging noted.   No results found for this or any previous visit (from the past 24 hour(s)).  Assessment and Plan :  1. Dysfunction of left eustachian tube Due to patient's  hx and PE findings, will treat for ETD. Pt instructed to use low dose sudafed, flonase, and zyrtec consistently for the next 3-5 days. Instructed to return to clinic if symptoms worsen, do not improve, or  as needed - pseudoephedrine (SUDAFED) 60 MG tablet; Take 1 tablet (60 mg total) by mouth 2 (two) times daily.  Dispense: 20 tablet; Refill: 0 - cetirizine (ZYRTEC) 10 MG tablet; Take 1 tablet (10 mg total) by mouth daily.  Dispense: 30 tablet; Refill: 0 - Care order/instruction:  Tenna Delaine, PA-C  [facility] 12/15/2016 4:34 PM

## 2016-12-19 ENCOUNTER — Encounter: Payer: Self-pay | Admitting: Physician Assistant

## 2016-12-20 ENCOUNTER — Other Ambulatory Visit: Payer: Self-pay | Admitting: Physician Assistant

## 2016-12-20 MED ORDER — PSEUDOEPHEDRINE HCL 60 MG PO TABS: 60.0000 mg | ORAL_TABLET | Freq: Three times a day (TID) | ORAL | 0 refills | Status: DC | PRN

## 2016-12-23 ENCOUNTER — Other Ambulatory Visit: Payer: Self-pay

## 2016-12-23 MED ORDER — PSEUDOEPHEDRINE HCL 60 MG PO TABS: 60.0000 mg | ORAL_TABLET | Freq: Three times a day (TID) | ORAL | 0 refills | Status: DC | PRN

## 2016-12-26 ENCOUNTER — Ambulatory Visit (INDEPENDENT_AMBULATORY_CARE_PROVIDER_SITE_OTHER): Payer: PPO | Admitting: Family Medicine

## 2016-12-26 VITALS — BP 110/69 | HR 65 | Temp 97.8°F | Resp 16 | Ht 70.25 in | Wt 147.6 lb

## 2016-12-26 DIAGNOSIS — J309 Allergic rhinitis, unspecified: Secondary | ICD-10-CM | POA: Diagnosis not present

## 2016-12-26 DIAGNOSIS — F32A Depression, unspecified: Secondary | ICD-10-CM

## 2016-12-26 DIAGNOSIS — F329 Major depressive disorder, single episode, unspecified: Secondary | ICD-10-CM | POA: Diagnosis not present

## 2016-12-26 DIAGNOSIS — E785 Hyperlipidemia, unspecified: Secondary | ICD-10-CM

## 2016-12-26 MED ORDER — ATORVASTATIN CALCIUM 40 MG PO TABS: 40.0000 mg | ORAL_TABLET | ORAL | 1 refills | Status: DC

## 2016-12-26 MED ORDER — FLUTICASONE PROPIONATE 50 MCG/ACT NA SUSP: 2.0000 | Freq: Every day | NASAL | 6 refills | Status: DC

## 2016-12-26 NOTE — Patient Instructions (Addendum)
Ok to continue same dose Lipitor every other day, or can always change to lower dose that you take every day. Let me know if you want to change that dose.   Continue Zoloft 50mg  once per day. Continue exercise to help with cholesterol and mood.   Over the counter zyrtec, and flonase nasal spray as needed for allergies.  Safe travels!  Allergic Rhinitis Allergic rhinitis is when the mucous membranes in the nose respond to allergens. Allergens are particles in the air that cause your body to have an allergic reaction. This causes you to release allergic antibodies. Through a chain of events, these eventually cause you to release histamine into the blood stream. Although meant to protect the body, it is this release of histamine that causes your discomfort, such as frequent sneezing, congestion, and an itchy, runny nose. What are the causes? Seasonal allergic rhinitis (hay fever) is caused by pollen allergens that may come from grasses, trees, and weeds. Year-round allergic rhinitis (perennial allergic rhinitis) is caused by allergens such as house dust mites, pet dander, and mold spores. What are the signs or symptoms?  Nasal stuffiness (congestion).  Itchy, runny nose with sneezing and tearing of the eyes. How is this diagnosed? Your health care provider can help you determine the allergen or allergens that trigger your symptoms. If you and your health care provider are unable to determine the allergen, skin or blood testing may be used. Your health care provider will diagnose your condition after taking your health history and performing a physical exam. Your health care provider may assess you for other related conditions, such as asthma, pink eye, or an ear infection. How is this treated? Allergic rhinitis does not have a cure, but it can be controlled by:  Medicines that block allergy symptoms. These may include allergy shots, nasal sprays, and oral antihistamines.  Avoiding the  allergen. Hay fever may often be treated with antihistamines in pill or nasal spray forms. Antihistamines block the effects of histamine. There are over-the-counter medicines that may help with nasal congestion and swelling around the eyes. Check with your health care provider before taking or giving this medicine. If avoiding the allergen or the medicine prescribed do not work, there are many new medicines your health care provider can prescribe. Stronger medicine may be used if initial measures are ineffective. Desensitizing injections can be used if medicine and avoidance does not work. Desensitization is when a patient is given ongoing shots until the body becomes less sensitive to the allergen. Make sure you follow up with your health care provider if problems continue. Follow these instructions at home: It is not possible to completely avoid allergens, but you can reduce your symptoms by taking steps to limit your exposure to them. It helps to know exactly what you are allergic to so that you can avoid your specific triggers. Contact a health care provider if:  You have a fever.  You develop a cough that does not stop easily (persistent).  You have shortness of breath.  You start wheezing.  Symptoms interfere with normal daily activities. This information is not intended to replace advice given to you by your health care provider. Make sure you discuss any questions you have with your health care provider. Document Released: 05/03/2001 Document Revised: 04/08/2016 Document Reviewed: 04/15/2013 Elsevier Interactive Patient Education  2017 [employer].   IF you received an x-ray today, you will receive an invoice from [facility]. Please contact [facility]iology at [phone] with questions  or concerns regarding your invoice.   IF you received labwork today, you will receive an invoice from [place]. Please contact [facility] at [phone] with questions or concerns  regarding your invoice.   Our billing staff will not be able to assist you with questions regarding bills from these companies.  You will be contacted with the lab results as soon as they are available. The fastest way to get your results is to activate your My Chart account. Instructions are located on the last page of this paperwork. If you have not heard from [name] regarding the results in 2 weeks, please contact this office.

## 2016-12-26 NOTE — Progress Notes (Signed)
By signing my name below, I, Mesha Guinyard, attest that this documentation has been prepared under the direction and in the presence of Merri Ray, MD.  Electronically Signed: Verlee Monte, Medical Scribe. 12/26/16. 9:03 AM.  Subjective:    Patient ID: Erika Johns, female    DOB: [birth year], 66 y.o.   MRN: [id number]  HPI Chief Complaint  Patient presents with  . Follow-up    Cholesterol check  . Medication Refill    Atorvastatin    HPI Comments: Erika Johns is a 66 y.o. female who presents to Primary Care at [facility] for [place] recheck. She takes lipitor 40 mg QD Pt is fasting.  Pt has been taking lipitor every other day since it was rx'ed that way "for a long time". Denies myalgia, chest pain, light-headedness, dizziness, or other acute side effects.  Lab Results  Component Value Date   CHOL 182 06/07/2016   HDL 58 06/07/2016   LDLCALC 107 06/07/2016   TRIG 85 06/07/2016   CHOLHDL 3.1 06/07/2016   Lab Results  Component Value Date   ALT 17 06/07/2016   AST 18 06/07/2016   ALKPHOS 60 06/07/2016   BILITOT 0.6 06/07/2016   ENT: Reports she hasn't had congestion since her tubes has been placed.  Constipation: Reports constant constipation.  Arthralgias/Exercise: Pt is doing yoga and she mowed the lawn yesterday. Pt is walking and plans on riding her bike again. She's eating right.  Seasonal Allergies: Pt uses flonase nasal spray QD PRN, but she has stopped taking all her medications.  Cough: Took a couple of ambien last week when she kept coughing. Sxs have complete resolved since.  Depression: Pt is taking zoloft 50 mg QD and klonopin when she's really nervous, which is very little (last took 12/05/16). Pt has been celebrating life and plans on selling her deceased husbands bikes. Reports ups and downs with her mood and wants to hangout with the living. Pt plans on going to [place] for 6 weeks and [place] to visit her sister. Denies SI, and  thoughts of self harm.   Depression screen [facility] [address] 12/26/2016 12/15/2016 09/27/2016 06/07/2016 05/12/2016  Decreased Interest 0 0 0 0 0  Down, Depressed, Hopeless 0 0 0 0 0  PHQ - 2 Score 0 0 0 0 0   Patient Active Problem List   Diagnosis Date Noted  . KNEE PAIN 08/27/2009  . LUMBAGO 08/27/2009  . 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 HAND PAIN, LEFT 08/27/2009   Past Medical History:  Diagnosis Date  . Allergy   . Anxiety   . Arthritis   . Constipation, chronic   . Depression   . IBS (irritable bowel syndrome)   . Insomnia   . PONV (postoperative nausea and vomiting)    severe n/v-needs scop   Past Surgical History:  Procedure Laterality Date  . COLONOSCOPY    . DILATION AND CURETTAGE OF UTERUS  2006  . FRACTURE SURGERY  1996   fx [id number]  . HARDWARE REMOVAL  1996   rt ankle  . KNEE ARTHROSCOPY  2003   lt  . KNEE ARTHROSCOPY     right  . ORIF CLAVICULAR FRACTURE Right 05/28/2013   Procedure: OPEN REDUCTION INTERNAL FIXATION (ORIF) CLAVICULAR FRACTURE;  Surgeon: Renette Butters, MD;  Location: [facility];  Service: Orthopedics;  Laterality: Right;  . TONSILLECTOMY     Allergies  Allergen Reactions  . Effexor [Venlafaxine Hydrochloride] Other (See Comments)    dizzy  .  Hydrocodone Nausea And Vomiting  . Ampicillin Rash   Prior to Admission medications   Medication Sig Start Date End Date Taking? Authorizing Provider  Ascorbic Acid (VITAMIN C) 1000 MG tablet Take 1,000 mg by mouth daily.   Yes [provider]  atorvastatin (LIPITOR) 40 MG tablet Take 1 tablet (40 mg total) by mouth every other day. 07/05/16  Yes [name], MD  cetirizine (ZYRTEC) 10 MG tablet Take 1 tablet (10 mg total) by mouth daily. 12/15/16  Yes Tenna Delaine D, PA-C  Cholecalciferol (VITAMIN D3) 1000 UNITS CAPS Take by mouth daily.   Yes [provider]  clonazePAM (KLONOPIN) 0.5 MG tablet Take 0.5-1 tablets (0.25-0.5 mg total) by mouth 2 (two) times  daily as needed for anxiety. 05/12/16  Yes [name], MD  diclofenac sodium (VOLTAREN) 1 % GEL Apply topically 4 (four) times daily.   Yes [provider]  fluticasone (FLONASE) 50 MCG/ACT nasal spray Place 2 sprays into both nostrils daily. 10/14/15  Yes [name], MD  glucosamine-chondroitin 500-400 MG tablet Take 1 tablet by mouth. 2 gel caps at breakfast   Yes [provider]  sertraline (ZOLOFT) 50 MG tablet Take 1 tablet (50 mg total) by mouth daily. Patient taking differently: Take 100 mg by mouth daily.  10/01/16  Yes [name], MD  tamsulosin (FLOMAX) 0.4 MG CAPS capsule Take 0.4 mg by mouth.   Yes [provider]  DOCOSAHEXAENOIC ACID PO Take by mouth.    [provider]  pseudoephedrine (SUDAFED) 60 MG tablet Take 1 tablet (60 mg total) by mouth every 8 (eight) hours as needed for congestion. Patient not taking: Reported on 12/26/2016 12/23/16   Leonie Douglas, PA-C   Social History   Social History  . Marital status: Widowed    Spouse name: N/A  . Number of children: N/A  . Years of education: N/A   Occupational History  . Not on file.   Social History Main Topics  . Smoking status: Never Smoker  . Smokeless tobacco: Never Used  . Alcohol use 0.0 oz/week  . Drug use: No  . Sexual activity: Yes   Other Topics Concern  . Not on file   Social History Narrative  . No narrative on file   Review of Systems  Respiratory: Negative for cough.   Cardiovascular: Negative for chest pain.  Gastrointestinal: Positive for constipation (chronic).  Musculoskeletal: Negative for arthralgias and myalgias.  Allergic/Immunologic: Positive for environmental allergies.  Neurological: Negative for dizziness and light-headedness.  Psychiatric/Behavioral: Negative for dysphoric mood, self-injury, sleep disturbance and suicidal ideas. The patient is not nervous/anxious.    Objective:  Physical Exam  Constitutional: She appears  well-developed and well-nourished. No distress.  HENT:  Head: Normocephalic and atraumatic.  Right Ear: Tympanic membrane, external ear and ear canal normal.  Left Ear: Tympanic membrane, external ear and ear canal normal.  Eyes: Conjunctivae are normal.  Neck: Neck supple.  Cardiovascular: Normal rate, regular rhythm and normal heart sounds.  Exam reveals no gallop and no friction rub.   No murmur heard. Pulmonary/Chest: Effort normal and breath sounds normal. No respiratory distress. She has no wheezes. She has no rales.  Neurological: She is alert.  Skin: Skin is warm and dry.  Psychiatric: She has a normal mood and affect. Her behavior is normal.  Nursing note and vitals reviewed.   Vitals:   12/26/16 0832  BP: 110/69  Pulse: 65  Resp: 16  Temp: 97.8 F (  36.6 C)  TempSrc: Oral  SpO2: 98%  Weight: 147 lb 9.6 oz (67 kg)  Height: 5' 10.25" (1.784 m)  Body mass index is 21.03 kg/m. Assessment & Plan:   Erika Johns is a 66 y.o. female Hyperlipidemia, unspecified hyperlipidemia type - Plan: atorvastatin (LIPITOR) 40 MG tablet, Lipid panel, Comprehensive metabolic panel  -Stable previous it. Tolerating Lipitor every other day dosing. Check labs, refilled Lipitor.  Allergic rhinitis, unspecified seasonality, unspecified trigger - Plan: fluticasone (FLONASE) 50 MCG/ACT nasal spray  - Flonase nasal spray, Zyrtec over-the-counter as needed. If eustachian tube symptoms do not continue to improve, consider ENT eval.  Depression, unspecified depression type  -Stable. Continue Zoloft 50 mg daily.  Meds ordered this encounter  Medications  . atorvastatin (LIPITOR) 40 MG tablet    Sig: Take 1 tablet (40 mg total) by mouth every other day.    Dispense:  45 tablet    Refill:  1  . fluticasone (FLONASE) 50 MCG/ACT nasal spray    Sig: Place 2 sprays into both nostrils daily.    Dispense:  16 g    Refill:  6   Patient Instructions   Ok to continue same dose Lipitor  every other day, or can always change to lower dose that you take every day. Let me know if you want to change that dose.   Continue Zoloft 50mg  once per day. Continue exercise to help with cholesterol and mood.   Over the counter zyrtec, and flonase nasal spray as needed for allergies.  Safe travels!  Allergic Rhinitis Allergic rhinitis is when the mucous membranes in the nose respond to allergens. Allergens are particles in the air that cause your body to have an allergic reaction. This causes you to release allergic antibodies. Through a chain of events, these eventually cause you to release histamine into the blood stream. Although meant to protect the body, it is this release of histamine that causes your discomfort, such as frequent sneezing, congestion, and an itchy, runny nose. What are the causes? Seasonal allergic rhinitis (hay fever) is caused by pollen allergens that may come from grasses, trees, and weeds. Year-round allergic rhinitis (perennial allergic rhinitis) is caused by allergens such as house dust mites, pet dander, and mold spores. What are the signs or symptoms?  Nasal stuffiness (congestion).  Itchy, runny nose with sneezing and tearing of the eyes. How is this diagnosed? Your health care provider can help you determine the allergen or allergens that trigger your symptoms. If you and your health care provider are unable to determine the allergen, skin or blood testing may be used. Your health care provider will diagnose your condition after taking your health history and performing a physical exam. Your health care provider may assess you for other related conditions, such as asthma, pink eye, or an ear infection. How is this treated? Allergic rhinitis does not have a cure, but it can be controlled by:  Medicines that block allergy symptoms. These may include allergy shots, nasal sprays, and oral antihistamines.  Avoiding the allergen. Hay fever may often be treated  with antihistamines in pill or nasal spray forms. Antihistamines block the effects of histamine. There are over-the-counter medicines that may help with nasal congestion and swelling around the eyes. Check with your health care provider before taking or giving this medicine. If avoiding the allergen or the medicine prescribed do not work, there are many new medicines your health care provider can prescribe. Stronger medicine may be used if initial  measures are ineffective. Desensitizing injections can be used if medicine and avoidance does not work. Desensitization is when a patient is given ongoing shots until the body becomes less sensitive to the allergen. Make sure you follow up with your health care provider if problems continue. Follow these instructions at home: It is not possible to completely avoid allergens, but you can reduce your symptoms by taking steps to limit your exposure to them. It helps to know exactly what you are allergic to so that you can avoid your specific triggers. Contact a health care provider if:  You have a fever.  You develop a cough that does not stop easily (persistent).  You have shortness of breath.  You start wheezing.  Symptoms interfere with normal daily activities. This information is not intended to replace advice given to you by your health care provider. Make sure you discuss any questions you have with your health care provider. Document Released: 05/03/2001 Document Revised: 04/08/2016 Document Reviewed: 04/15/2013 Elsevier Interactive Patient Education  2017 [employer].   IF you received an x-ray today, you will receive an invoice from [facility]. Please contact [facility]iology at [phone] with questions or concerns regarding your invoice.   IF you received labwork today, you will receive an invoice from [place]. Please contact [facility] at [phone] with questions or concerns regarding your invoice.   Our billing staff  will not be able to assist you with questions regarding bills from these companies.  You will be contacted with the lab results as soon as they are available. The fastest way to get your results is to activate your My Chart account. Instructions are located on the last page of this paperwork. If you have not heard from [name] regarding the results in 2 weeks, please contact this office.      I personally performed the services described in this documentation, which was scribed in my presence. The recorded information has been reviewed and considered for accuracy and completeness, addended by me as needed, and agree with information above.  Signed,   Merri Ray, MD [facility] at [facility].  12/26/16 9:18 AM

## 2016-12-27 LAB — COMPREHENSIVE METABOLIC PANEL
ALBUMIN: 4.1 g/dL (ref 3.6–4.8)
ALT: 19 IU/L (ref 0–32)
AST: 21 IU/L (ref 0–40)
Albumin/Globulin Ratio: 1.9 (ref 1.2–2.2)
Alkaline Phosphatase: 73 IU/L (ref 39–117)
BUN / CREAT RATIO: 22 (ref 12–28)
BUN: 17 mg/dL (ref 8–27)
Bilirubin Total: 0.4 mg/dL (ref 0.0–1.2)
CO2: 25 mmol/L (ref 18–29)
CREATININE: 0.77 mg/dL (ref 0.57–1.00)
Calcium: 8.9 mg/dL (ref 8.7–10.3)
Chloride: 101 mmol/L (ref 96–106)
GFR calc Af Amer: 92 mL/min/{1.73_m2} (ref >59)
GFR calc non Af Amer: 80 mL/min/{1.73_m2} (ref >59)
Globulin, Total: 2.2 g/dL (ref 1.5–4.5)
Glucose: 86 mg/dL (ref 65–99)
Potassium: 4 mmol/L (ref 3.5–5.2)
Sodium: 140 mmol/L (ref 134–144)
Total Protein: 6.3 g/dL (ref 6.0–8.5)

## 2016-12-27 LAB — LIPID PANEL
CHOL/HDL RATIO: 3 ratio (ref 0.0–4.4)
Cholesterol, Total: 178 mg/dL (ref 100–199)
HDL: 59 mg/dL (ref >39)
LDL Calculated: 105 mg/dL — ABNORMAL HIGH (ref 0–99)
TRIGLYCERIDES: 70 mg/dL (ref 0–149)
VLDL Cholesterol Cal: 14 mg/dL (ref 5–40)

## 2017-01-03 DIAGNOSIS — Z1231 Encounter for screening mammogram for malignant neoplasm of breast: Secondary | ICD-10-CM | POA: Diagnosis not present

## 2017-01-03 DIAGNOSIS — Z803 Family history of malignant neoplasm of breast: Secondary | ICD-10-CM | POA: Diagnosis not present

## 2017-04-03 ENCOUNTER — Encounter: Payer: Self-pay | Admitting: Family Medicine

## 2017-04-03 ENCOUNTER — Ambulatory Visit (INDEPENDENT_AMBULATORY_CARE_PROVIDER_SITE_OTHER): Payer: PPO | Admitting: Family Medicine

## 2017-04-03 VITALS — BP 121/74 | HR 67 | Temp 98.8°F | Resp 16 | Ht 70.5 in | Wt 148.0 lb

## 2017-04-03 DIAGNOSIS — F5104 Psychophysiologic insomnia: Secondary | ICD-10-CM | POA: Diagnosis not present

## 2017-04-03 DIAGNOSIS — F439 Reaction to severe stress, unspecified: Secondary | ICD-10-CM | POA: Diagnosis not present

## 2017-04-03 DIAGNOSIS — G8929 Other chronic pain: Secondary | ICD-10-CM

## 2017-04-03 DIAGNOSIS — F418 Other specified anxiety disorders: Secondary | ICD-10-CM

## 2017-04-03 DIAGNOSIS — M25562 Pain in left knee: Secondary | ICD-10-CM

## 2017-04-03 DIAGNOSIS — R208 Other disturbances of skin sensation: Secondary | ICD-10-CM | POA: Diagnosis not present

## 2017-04-03 MED ORDER — SERTRALINE HCL 50 MG PO TABS: 50.0000 mg | ORAL_TABLET | Freq: Every day | ORAL | 1 refills | Status: DC

## 2017-04-03 NOTE — Patient Instructions (Addendum)
Ok to continue Zoloft same dose for now, but would recommend continuing counseling, and exercise. I also think that service and volunteering is good for you. See info for sleep below. Klonopin could be used for sleep if needed. If you continue to need something to remain asleep, I would consider Belsomra instead of Ambien. Let me know and I can send that to your pharmacy if you would like. If that is cost prohibitive, or not working well, we could try Ambien, but would not recommend any higher dosing than 5 mg.  If you plan to have surgery for knee - return for preoperative clearance. Diclofenac if needed, but topical Voltaren may be safer. Ideally would not want to take voltaren long term.   I do not see any concerns on exam today of your foot, but if that persists we can look at some other causes of neuropathy.  That can also be due to prior injury. Please return for those tests when you are ready.   Insomnia Insomnia is a sleep disorder that makes it difficult to fall asleep or to stay asleep. Insomnia can cause tiredness (fatigue), low energy, difficulty concentrating, mood swings, and poor performance at work or school. There are three different ways to classify insomnia:  Difficulty falling asleep.  Difficulty staying asleep.  Waking up too early in the morning.  Any type of insomnia can be long-term (chronic) or short-term (acute). Both are common. Short-term insomnia usually lasts for three months or less. Chronic insomnia occurs at least three times a week for longer than three months. What are the causes? Insomnia may be caused by another condition, situation, or substance, such as:  Anxiety.  Certain medicines.  Gastroesophageal reflux disease (GERD) or other gastrointestinal conditions.  Asthma or other breathing conditions.  Restless legs syndrome, sleep apnea, or other sleep disorders.  Chronic pain.  Menopause. This may include hot flashes.  Stroke.  Abuse of  alcohol, tobacco, or illegal drugs.  Depression.  Caffeine.  Neurological disorders, such as Alzheimer disease.  An overactive thyroid (hyperthyroidism).  The cause of insomnia may not be known. What increases the risk? Risk factors for insomnia include:  Gender. Women are more commonly affected than men.  Age. Insomnia is more common as you get older.  Stress. This may involve your professional or personal life.  Income. Insomnia is more common in people with lower income.  Lack of exercise.  Irregular work schedule or night shifts.  Traveling between different time zones.  What are the signs or symptoms? If you have insomnia, trouble falling asleep or trouble staying asleep is the main symptom. This may lead to other symptoms, such as:  Feeling fatigued.  Feeling nervous about going to sleep.  Not feeling rested in the morning.  Having trouble concentrating.  Feeling irritable, anxious, or depressed.  How is this treated? Treatment for insomnia depends on the cause. If your insomnia is caused by an underlying condition, treatment will focus on addressing the condition. Treatment may also include:  Medicines to help you sleep.  Counseling or therapy.  Lifestyle adjustments.  Follow these instructions at home:  Take medicines only as directed by your health care provider.  Keep regular sleeping and waking hours. Avoid naps.  Keep a sleep diary to help you and your health care provider figure out what could be causing your insomnia. Include: ? When you sleep. ? When you wake up during the night. ? How well you sleep. ? How rested you feel the  next day. ? Any side effects of medicines you are taking. ? What you eat and drink.  Make your bedroom a comfortable place where it is easy to fall asleep: ? Put up shades or special blackout curtains to block light from outside. ? Use a white noise machine to block noise. ? Keep the temperature cool.  Exercise  regularly as directed by your health care provider. Avoid exercising right before bedtime.  Use relaxation techniques to manage stress. Ask your health care provider to suggest some techniques that may work well for you. These may include: ? Breathing exercises. ? Routines to release muscle tension. ? Visualizing peaceful scenes.  Cut back on alcohol, caffeinated beverages, and cigarettes, especially close to bedtime. These can disrupt your sleep.  Do not overeat or eat spicy foods right before bedtime. This can lead to digestive discomfort that can make it hard for you to sleep.  Limit screen use before bedtime. This includes: ? Watching TV. ? Using your smartphone, tablet, and computer.  Stick to a routine. This can help you fall asleep faster. Try to do a quiet activity, brush your teeth, and go to bed at the same time each night.  Get out of bed if you are still awake after 15 minutes of trying to sleep. Keep the lights down, but try reading or doing a quiet activity. When you feel sleepy, go back to bed.  Make sure that you drive carefully. Avoid driving if you feel very sleepy.  Keep all follow-up appointments as directed by your health care provider. This is important. Contact a health care provider if:  You are tired throughout the day or have trouble in your daily routine due to sleepiness.  You continue to have sleep problems or your sleep problems get worse. Get help right away if:  You have serious thoughts about hurting yourself or someone else. This information is not intended to replace advice given to you by your health care provider. Make sure you discuss any questions you have with your health care provider. Document Released: 08/05/2000 Document Revised: 01/08/2016 Document Reviewed: 05/09/2014 Elsevier Interactive Patient Education  2018 [employer].   IF you received an x-ray today, you will receive an invoice from [facility]. Please contact  [facility] at [phone] with questions or concerns regarding your invoice.   IF you received labwork today, you will receive an invoice from [place]. Please contact [facility] at [phone] with questions or concerns regarding your invoice.   Our billing staff will not be able to assist you with questions regarding bills from these companies.  You will be contacted with the lab results as soon as they are available. The fastest way to get your results is to activate your My Chart account. Instructions are located on the last page of this paperwork. If you have not heard from [name] regarding the results in 2 weeks, please contact this office.

## 2017-04-03 NOTE — Progress Notes (Signed)
Subjective:  By signing my name below, I, Moises Blood, attest that this documentation has been prepared under the direction and in the presence of Merri Ray, MD. Electronically Signed: Moises Blood, [place]. 04/03/2017 , 10:19 AM .  Patient was seen in Room 25 .   Patient ID: Erika Johns, female    DOB: 12-11-1950, 66 y.o.   MRN: [id number] Chief Complaint  Patient presents with  . Insomnia  . Medication Refill    zoloft   HPI Erika Johns is a 66 y.o. female Here for follow up.   Depression with anxiety, insomnia See previous visits. Her husband passed away last year. When discussed in February, she was improving and decreased to Zoloft 50mg  and continued klonopin if needed. She was still stable in May, continued on same dose of Zoloft 50mg  with klonopin as needed. Her last prescription of klonopin #45 no refill in Sept 2017.   She's been having trouble staying asleep, or returning to sleep. Some nights she does well, but other nights, if she's thinking too much, she isn't able to stay asleep. She tried taking leftover Ambien 10mg . She hasn't been taking her klonopin; when she took klonopin before going to sleep in the past, she had trouble staying asleep.   She's still meeting with hospice group occasionally as well as meeting her counselor, Vivia Budge. Her mother will be 81 years old this year. Her sister, who lives in [place], requires special needs and is unable to manage her finances. Patient has been taking managing their accounts. She also visit her sister recently in [place], and helped around. She's planning her granddaughter's 8th birthday coming up. She denies SI/Hi. She drinks a glass of wine a night.   Left knee pain She's been doing too many squats and stretches with yoga; noticed left knee pain. She was still able to walk 2 miles yesterday. Her orthopedist is Dr. Noemi Chapel. She was prescribed Diclofenac 75mg  up to twice a day, but  taking it QD every morning with food. Without taking it, her knees will hurt. She denies heartburn or abdominal pain. She does have Voltaren gel if needed  Right foot burning She mentions top of her right foot burning rarely, ongoing for 3 months. It would occur even when she's just sitting there, and then goes away. She denies burning in any other affected areas. She had surgery in her right leg in the past in 1998, with a plate and 8 screws.   Patient Active Problem List   Diagnosis Date Noted  . KNEE PAIN 08/27/2009  . LUMBAGO 08/27/2009  . 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 HAND PAIN, LEFT 08/27/2009   Past Medical History:  Diagnosis Date  . Allergy   . Anxiety   . Arthritis   . Constipation, chronic   . Depression   . IBS (irritable bowel syndrome)   . Insomnia   . PONV (postoperative nausea and vomiting)    severe n/v-needs scop   Past Surgical History:  Procedure Laterality Date  . COLONOSCOPY    . DILATION AND CURETTAGE OF UTERUS  2006  . FRACTURE SURGERY  1996   fx [id number]  . HARDWARE REMOVAL  1996   rt ankle  . KNEE ARTHROSCOPY  2003   lt  . KNEE ARTHROSCOPY     right  . ORIF CLAVICULAR FRACTURE Right 05/28/2013   Procedure: OPEN REDUCTION INTERNAL FIXATION (ORIF) CLAVICULAR FRACTURE;  Surgeon: Renette Butters, MD;  Location: [facility];  Service:  Orthopedics;  Laterality: Right;  . TONSILLECTOMY     Allergies  Allergen Reactions  . Effexor [Venlafaxine Hydrochloride] Other (See Comments)    dizzy  . Hydrocodone Nausea And Vomiting  . Ampicillin Rash   Prior to Admission medications   Medication Sig Start Date End Date Taking? Authorizing Provider  Ascorbic Acid (VITAMIN C) 1000 MG tablet Take 1,000 mg by mouth daily.    [provider]  atorvastatin (LIPITOR) 40 MG tablet Take 1 tablet (40 mg total) by mouth every other day. 12/26/16   [name], MD  cetirizine (ZYRTEC) 10 MG tablet Take 1 tablet (10 mg total) by mouth  daily. 12/15/16   Tenna Delaine D, PA-C  Cholecalciferol (VITAMIN D3) 1000 UNITS CAPS Take by mouth daily.    [provider]  clonazePAM (KLONOPIN) 0.5 MG tablet Take 0.5-1 tablets (0.25-0.5 mg total) by mouth 2 (two) times daily as needed for anxiety. 05/12/16   [name], MD  diclofenac sodium (VOLTAREN) 1 % GEL Apply topically 4 (four) times daily.    [provider]  DOCOSAHEXAENOIC ACID PO Take by mouth.    [provider]  fluticasone (FLONASE) 50 MCG/ACT nasal spray Place 2 sprays into both nostrils daily. 12/26/16   [name], MD  glucosamine-chondroitin 500-400 MG tablet Take 1 tablet by mouth. 2 gel caps at breakfast    [provider]  pseudoephedrine (SUDAFED) 60 MG tablet Take 1 tablet (60 mg total) by mouth every 8 (eight) hours as needed for congestion. Patient not taking: Reported on 12/26/2016 12/23/16   Tenna Delaine D, PA-C  sertraline (ZOLOFT) 50 MG tablet Take 1 tablet (50 mg total) by mouth daily. Patient taking differently: Take 100 mg by mouth daily.  10/01/16   [name], MD  tamsulosin (FLOMAX) 0.4 MG CAPS capsule Take 0.4 mg by mouth.    [provider]   Social History   Social History  . Marital status: Widowed    Spouse name: N/A  . Number of children: N/A  . Years of education: N/A   Occupational History  . Not on file.   Social History Main Topics  . Smoking status: Never Smoker  . Smokeless tobacco: Never Used  . Alcohol use 0.0 oz/week  . Drug use: No  . Sexual activity: Yes   Other Topics Concern  . Not on file   Social History Narrative  . No narrative on file   Review of Systems  Constitutional: Negative for fatigue and unexpected weight change.  Respiratory: Negative for chest tightness and shortness of breath.   Cardiovascular: Negative for chest pain, palpitations and leg swelling.  Gastrointestinal: Negative for abdominal pain and blood in stool.  Neurological:  Negative for dizziness, syncope, light-headedness and headaches.  Psychiatric/Behavioral: Positive for sleep disturbance.       Objective:   Physical Exam  Constitutional: She is oriented to person, place, and time. She appears well-developed and well-nourished.  HENT:  Head: Normocephalic and atraumatic.  Eyes: Pupils are equal, round, and reactive to light. Conjunctivae and EOM are normal.  Neck: Carotid bruit is not present.  Cardiovascular: Normal rate, regular rhythm, normal heart sounds and intact distal pulses.   Pulses:      Dorsalis pedis pulses are 2+ on the right side.  Right foot: cap refill <1 second  Pulmonary/Chest: Effort normal and breath sounds normal.  Abdominal: Soft. She exhibits no pulsatile midline mass. There is no tenderness.  Neurological: She is alert  and oriented to person, place, and time.  normal sensation of dorsum of right foot  Skin: Skin is warm and dry.  Psychiatric: She has a normal mood and affect. Her behavior is normal.  Vitals reviewed. Well-healed scar anterior right lower leg. Nontender  Vitals:   04/03/17 0949  BP: 121/74  Pulse: 67  Resp: 16  Temp: 98.8 F (37.1 C)  TempSrc: Oral  SpO2: 97%  Weight: 148 lb (67.1 kg)  Height: 5' 10.5" (1.791 m)      Assessment & Plan:   Erika Johns is a 66 y.o. female Depression with anxiety - Plan: sertraline (ZOLOFT) 50 MG tablet Psychophysiological insomnia Situational stress - Plan: sertraline (ZOLOFT) 50 MG tablet  - Continue same dose of Zoloft, Klonopin if needed at night, or could consider Belsomra. She can call for prescription if she wants to try that medication. If she did end up requiring Ambien, would not recommend any higher dosing than 5 mg.  - Continue counseling and recheck in 6 months to determine if dosing changes needed. Did not recommend coming off of Zoloft at this time based on other stressors.   Chronic pain of left knee  - Has deferred knee replacement,  overall doing well with once per day and said. Option of topical diclofenac discussed as well. She does have that at home if needed, and may be safer long-term  Burning sensation of foot  - Possible neuropathic pain, may be related to previous surgery. Option of B12, other testing discussed, but she would like to defer that at this time. RTC precautions if symptoms persist and look into other causes of neuropathy, including possible NCS.  Meds ordered this encounter  Medications  . clindamycin (CLEOCIN) 150 MG capsule    Sig: Take by mouth 4 (four) times daily.  . sertraline (ZOLOFT) 50 MG tablet    Sig: Take 1 tablet (50 mg total) by mouth daily.    Dispense:  90 tablet    Refill:  1   Patient Instructions   Ok to continue Zoloft same dose for now, but would recommend continuing counseling, and exercise. I also think that service and volunteering is good for you. See info for sleep below. Klonopin could be used for sleep if needed. If you continue to need something to remain asleep, I would consider Belsomra instead of Ambien. Let me know and I can send that to your pharmacy if you would like. If that is cost prohibitive, or not working well, we could try Ambien, but would not recommend any higher dosing than 5 mg.  If you plan to have surgery for knee - return for preoperative clearance. Diclofenac if needed, but topical Voltaren may be safer. Ideally would not want to take voltaren long term.   I do not see any concerns on exam today of your foot, but if that persists we can look at some other causes of neuropathy.  That can also be due to prior injury. Please return for those tests when you are ready.   Insomnia Insomnia is a sleep disorder that makes it difficult to fall asleep or to stay asleep. Insomnia can cause tiredness (fatigue), low energy, difficulty concentrating, mood swings, and poor performance at work or school. There are three different ways to classify  insomnia:  Difficulty falling asleep.  Difficulty staying asleep.  Waking up too early in the morning.  Any type of insomnia can be long-term (chronic) or short-term (acute). Both are common. Short-term insomnia usually  lasts for three months or less. Chronic insomnia occurs at least three times a week for longer than three months. What are the causes? Insomnia may be caused by another condition, situation, or substance, such as:  Anxiety.  Certain medicines.  Gastroesophageal reflux disease (GERD) or other gastrointestinal conditions.  Asthma or other breathing conditions.  Restless legs syndrome, sleep apnea, or other sleep disorders.  Chronic pain.  Menopause. This may include hot flashes.  Stroke.  Abuse of alcohol, tobacco, or illegal drugs.  Depression.  Caffeine.  Neurological disorders, such as Alzheimer disease.  An overactive thyroid (hyperthyroidism).  The cause of insomnia may not be known. What increases the risk? Risk factors for insomnia include:  Gender. Women are more commonly affected than men.  Age. Insomnia is more common as you get older.  Stress. This may involve your professional or personal life.  Income. Insomnia is more common in people with lower income.  Lack of exercise.  Irregular work schedule or night shifts.  Traveling between different time zones.  What are the signs or symptoms? If you have insomnia, trouble falling asleep or trouble staying asleep is the main symptom. This may lead to other symptoms, such as:  Feeling fatigued.  Feeling nervous about going to sleep.  Not feeling rested in the morning.  Having trouble concentrating.  Feeling irritable, anxious, or depressed.  How is this treated? Treatment for insomnia depends on the cause. If your insomnia is caused by an underlying condition, treatment will focus on addressing the condition. Treatment may also include:  Medicines to help you  sleep.  Counseling or therapy.  Lifestyle adjustments.  Follow these instructions at home:  Take medicines only as directed by your health care provider.  Keep regular sleeping and waking hours. Avoid naps.  Keep a sleep diary to help you and your health care provider figure out what could be causing your insomnia. Include: ? When you sleep. ? When you wake up during the night. ? How well you sleep. ? How rested you feel the next day. ? Any side effects of medicines you are taking. ? What you eat and drink.  Make your bedroom a comfortable place where it is easy to fall asleep: ? Put up shades or special blackout curtains to block light from outside. ? Use a white noise machine to block noise. ? Keep the temperature cool.  Exercise regularly as directed by your health care provider. Avoid exercising right before bedtime.  Use relaxation techniques to manage stress. Ask your health care provider to suggest some techniques that may work well for you. These may include: ? Breathing exercises. ? Routines to release muscle tension. ? Visualizing peaceful scenes.  Cut back on alcohol, caffeinated beverages, and cigarettes, especially close to bedtime. These can disrupt your sleep.  Do not overeat or eat spicy foods right before bedtime. This can lead to digestive discomfort that can make it hard for you to sleep.  Limit screen use before bedtime. This includes: ? Watching TV. ? Using your smartphone, tablet, and computer.  Stick to a routine. This can help you fall asleep faster. Try to do a quiet activity, brush your teeth, and go to bed at the same time each night.  Get out of bed if you are still awake after 15 minutes of trying to sleep. Keep the lights down, but try reading or doing a quiet activity. When you feel sleepy, go back to bed.  Make sure that you drive carefully. Avoid  driving if you feel very sleepy.  Keep all follow-up appointments as directed by your health  care provider. This is important. Contact a health care provider if:  You are tired throughout the day or have trouble in your daily routine due to sleepiness.  You continue to have sleep problems or your sleep problems get worse. Get help right away if:  You have serious thoughts about hurting yourself or someone else. This information is not intended to replace advice given to you by your health care provider. Make sure you discuss any questions you have with your health care provider. Document Released: 08/05/2000 Document Revised: 01/08/2016 Document Reviewed: 05/09/2014 Elsevier Interactive Patient Education  2018 [employer].   IF you received an x-ray today, you will receive an invoice from [facility]. Please contact [facility] at [phone] with questions or concerns regarding your invoice.   IF you received labwork today, you will receive an invoice from [facility]. Please contact [facility] at [phone] with questions or concerns regarding your invoice.   Our billing staff will not be able to assist you with questions regarding bills from these companies.  You will be contacted with the lab results as soon as they are available. The fastest way to get your results is to activate your My Chart account. Instructions are located on the last page of this paperwork. If you have not heard from [name] regarding the results in 2 weeks, please contact this office.       I personally performed the services described in this documentation, which was scribed in my presence. The recorded information has been reviewed and considered for accuracy and completeness, addended by me as needed, and agree with information above.  Signed,   Merri Ray, MD [facility].  04/03/17 10:27 AM

## 2017-04-04 DIAGNOSIS — H6992 Unspecified Eustachian tube disorder, left ear: Secondary | ICD-10-CM | POA: Insufficient documentation

## 2017-04-04 DIAGNOSIS — Z7289 Other problems related to lifestyle: Secondary | ICD-10-CM | POA: Diagnosis not present

## 2017-04-04 DIAGNOSIS — H6982 Other specified disorders of Eustachian tube, left ear: Secondary | ICD-10-CM | POA: Insufficient documentation

## 2017-04-04 DIAGNOSIS — H903 Sensorineural hearing loss, bilateral: Secondary | ICD-10-CM | POA: Insufficient documentation

## 2017-04-05 DIAGNOSIS — R3914 Feeling of incomplete bladder emptying: Secondary | ICD-10-CM | POA: Diagnosis not present

## 2017-04-21 DIAGNOSIS — L720 Epidermal cyst: Secondary | ICD-10-CM | POA: Diagnosis not present

## 2017-04-21 DIAGNOSIS — D1801 Hemangioma of skin and subcutaneous tissue: Secondary | ICD-10-CM | POA: Diagnosis not present

## 2017-04-21 DIAGNOSIS — L812 Freckles: Secondary | ICD-10-CM | POA: Diagnosis not present

## 2017-04-21 DIAGNOSIS — L821 Other seborrheic keratosis: Secondary | ICD-10-CM | POA: Diagnosis not present

## 2017-04-21 DIAGNOSIS — L858 Other specified epidermal thickening: Secondary | ICD-10-CM | POA: Diagnosis not present

## 2017-04-21 DIAGNOSIS — D225 Melanocytic nevi of trunk: Secondary | ICD-10-CM | POA: Diagnosis not present

## 2017-04-21 DIAGNOSIS — D2239 Melanocytic nevi of other parts of face: Secondary | ICD-10-CM | POA: Diagnosis not present

## 2017-04-21 DIAGNOSIS — L570 Actinic keratosis: Secondary | ICD-10-CM | POA: Diagnosis not present

## 2017-04-25 DIAGNOSIS — H04123 Dry eye syndrome of bilateral lacrimal glands: Secondary | ICD-10-CM | POA: Diagnosis not present

## 2017-04-25 DIAGNOSIS — H5203 Hypermetropia, bilateral: Secondary | ICD-10-CM | POA: Diagnosis not present

## 2017-04-25 DIAGNOSIS — H2513 Age-related nuclear cataract, bilateral: Secondary | ICD-10-CM | POA: Diagnosis not present

## 2017-04-25 DIAGNOSIS — H02831 Dermatochalasis of right upper eyelid: Secondary | ICD-10-CM | POA: Diagnosis not present

## 2017-04-25 DIAGNOSIS — H52223 Regular astigmatism, bilateral: Secondary | ICD-10-CM | POA: Diagnosis not present

## 2017-04-25 DIAGNOSIS — H353132 Nonexudative age-related macular degeneration, bilateral, intermediate dry stage: Secondary | ICD-10-CM | POA: Diagnosis not present

## 2017-04-25 DIAGNOSIS — H524 Presbyopia: Secondary | ICD-10-CM | POA: Diagnosis not present

## 2017-04-26 ENCOUNTER — Ambulatory Visit: Payer: PPO

## 2017-04-27 ENCOUNTER — Ambulatory Visit: Payer: PPO

## 2017-04-27 VITALS — BP 117/70 | HR 69 | Temp 98.1°F | Ht 70.5 in | Wt 148.0 lb

## 2017-04-27 DIAGNOSIS — Z0000 Encounter for general adult medical examination without abnormal findings: Secondary | ICD-10-CM

## 2017-04-27 NOTE — Patient Instructions (Addendum)
Erika Johns , Thank you for taking time to come for your Medicare Wellness Visit. I appreciate your ongoing commitment to your health goals. Please review the following plan we discussed and let me know if I can assist you in the future.   Screening recommendations/referrals: Colonoscopy: up to date, next due 12/21/2021 Mammogram: up to date, you stated you had this done 01/03/17 Bone Density: up to date, next due 07/07/2020 Recommended yearly ophthalmology/optometry visit for glaucoma screening and checkup Recommended yearly dental visit for hygiene and checkup  Vaccinations: Influenza vaccine: You want to wait until October and receive this.  Pneumococcal vaccine: up to date  Tdap vaccine: due, You will check with your insurance concerning this.  Shingles vaccine: up to date   Advanced directives: Please bring a copy of your POA (Power of [place]) and/or Living Will to your next appointment.   Conditions/risks identified: Increase exercising to at least 3 times a week to promote overall health.   Next appointment: schedule follow up with pcp, 1 year for AWV   Preventive Care 13 Years and Older, Female Preventive care refers to lifestyle choices and visits with your health care provider that can promote health and wellness. What does preventive care include?  A yearly physical exam. This is also called an annual well check.  Dental exams once or twice a year.  Routine eye exams. Ask your health care provider how often you should have your eyes checked.  Personal lifestyle choices, including:  Daily care of your teeth and gums.  Regular physical activity.  Eating a healthy diet.  Avoiding tobacco and drug use.  Limiting alcohol use.  Practicing safe sex.  Taking low-dose aspirin every day.  Taking vitamin and mineral supplements as recommended by your health care provider. What happens during an annual well check? The services and screenings done by your  health care provider during your annual well check will depend on your age, overall health, lifestyle risk factors, and family history of disease. Counseling  Your health care provider may ask you questions about your:  Alcohol use.  Tobacco use.  Drug use.  Emotional well-being.  Home and relationship well-being.  Sexual activity.  Eating habits.  History of falls.  Memory and ability to understand (cognition).  Work and work Statistician.  Reproductive health. Screening  You may have the following tests or measurements:  Height, weight, and BMI.  Blood pressure.  Lipid and cholesterol levels. These may be checked every 5 years, or more frequently if you are over 71 years old.  Skin check.  Lung cancer screening. You may have this screening every year starting at age 84 if you have a 30-pack-year history of smoking and currently smoke or have quit within the past 15 years.  Fecal occult blood test (FOBT) of the stool. You may have this test every year starting at age 42.  Flexible sigmoidoscopy or colonoscopy. You may have a sigmoidoscopy every 5 years or a colonoscopy every 10 years starting at age 72.  Hepatitis C blood test.  Hepatitis B blood test.  Sexually transmitted disease (STD) testing.  Diabetes screening. This is done by checking your blood sugar (glucose) after you have not eaten for a while (fasting). You may have this done every 1-3 years.  Bone density scan. This is done to screen for osteoporosis. You may have this done starting at age 55.  Mammogram. This may be done every 1-2 years. Talk to your health care provider about how often you  should have regular mammograms. Talk with your health care provider about your test results, treatment options, and if necessary, the need for more tests. Vaccines  Your health care provider may recommend certain vaccines, such as:  Influenza vaccine. This is recommended every year.  Tetanus, diphtheria, and  acellular pertussis (Tdap, Td) vaccine. You may need a Td booster every 10 years.  Zoster vaccine. You may need this after age 69.  Pneumococcal 13-valent conjugate (PCV13) vaccine. One dose is recommended after age 32.  Pneumococcal polysaccharide (PPSV23) vaccine. One dose is recommended after age 67. Talk to your health care provider about which screenings and vaccines you need and how often you need them. This information is not intended to replace advice given to you by your health care provider. Make sure you discuss any questions you have with your health care provider. Document Released: 09/04/2015 Document Revised: 04/27/2016 Document Reviewed: 06/09/2015 Elsevier Interactive Patient Education  2017 [place] Prevention in the Home Falls can cause injuries. They can happen to people of all ages. There are many things you can do to make your home safe and to help prevent falls. What can I do on the outside of my home?  Regularly fix the edges of walkways and driveways and fix any cracks.  Remove anything that might make you trip as you walk through a door, such as a raised step or threshold.  Trim any bushes or trees on the path to your home.  Use bright outdoor lighting.  Clear any walking paths of anything that might make someone trip, such as rocks or tools.  Regularly check to see if handrails are loose or broken. Make sure that both sides of any steps have handrails.  Any raised decks and porches should have guardrails on the edges.  Have any leaves, snow, or ice cleared regularly.  Use sand or salt on walking paths during winter.  Clean up any spills in your garage right away. This includes oil or grease spills. What can I do in the bathroom?  Use night lights.  Install grab bars by the toilet and in the tub and shower. Do not use towel bars as grab bars.  Use non-skid mats or decals in the tub or shower.  If you need to sit down in the shower, use  a plastic, non-slip stool.  Keep the floor dry. Clean up any water that spills on the floor as soon as it happens.  Remove soap buildup in the tub or shower regularly.  Attach bath mats securely with double-sided non-slip rug tape.  Do not have throw rugs and other things on the floor that can make you trip. What can I do in the bedroom?  Use night lights.  Make sure that you have a light by your bed that is easy to reach.  Do not use any sheets or blankets that are too big for your bed. They should not hang down onto the floor.  Have a firm chair that has side arms. You can use this for support while you get dressed.  Do not have throw rugs and other things on the floor that can make you trip. What can I do in the kitchen?  Clean up any spills right away.  Avoid walking on wet floors.  Keep items that you use a lot in easy-to-reach places.  If you need to reach something above you, use a strong step stool that has a grab bar.  Keep electrical cords out  of the way.  Do not use floor polish or wax that makes floors slippery. If you must use wax, use non-skid floor wax.  Do not have throw rugs and other things on the floor that can make you trip. What can I do with my stairs?  Do not leave any items on the stairs.  Make sure that there are handrails on both sides of the stairs and use them. Fix handrails that are broken or loose. Make sure that handrails are as long as the stairways.  Check any carpeting to make sure that it is firmly attached to the stairs. Fix any carpet that is loose or worn.  Avoid having throw rugs at the top or bottom of the stairs. If you do have throw rugs, attach them to the floor with carpet tape.  Make sure that you have a light switch at the top of the stairs and the bottom of the stairs. If you do not have them, ask someone to add them for you. What else can I do to help prevent falls?  Wear shoes that:  Do not have high heels.  Have  rubber bottoms.  Are comfortable and fit you well.  Are closed at the toe. Do not wear sandals.  If you use a stepladder:  Make sure that it is fully opened. Do not climb a closed stepladder.  Make sure that both sides of the stepladder are locked into place.  Ask someone to hold it for you, if possible.  Clearly mark and make sure that you can see:  Any grab bars or handrails.  First and last steps.  Where the edge of each step is.  Use tools that help you move around (mobility aids) if they are needed. These include:  Canes.  Walkers.  Scooters.  Crutches.  Turn on the lights when you go into a dark area. Replace any light bulbs as soon as they burn out.  Set up your furniture so you have a clear path. Avoid moving your furniture around.  If any of your floors are uneven, fix them.  If there are any pets around you, be aware of where they are.  Review your medicines with your doctor. Some medicines can make you feel dizzy. This can increase your chance of falling. Ask your doctor what other things that you can do to help prevent falls. This information is not intended to replace advice given to you by your health care provider. Make sure you discuss any questions you have with your health care provider. Document Released: 06/04/2009 Document Revised: 01/14/2016 Document Reviewed: 09/12/2014 Elsevier Interactive Patient Education  2017 [employer].

## 2017-04-27 NOTE — Progress Notes (Signed)
Subjective:   Adra Shepler Sparks-Baumgartner is a 66 y.o. female who presents for Medicare Annual (Subsequent) preventive examination.  Review of Systems:  N/A Cardiac Risk Factors include: advanced age (>52men, >58 women);dyslipidemia     Objective:     Vitals: BP 117/70   Pulse 69   Temp 98.1 F (36.7 C) (Oral)   Ht 5' 10.5" (1.791 m)   Wt 148 lb (67.1 kg)   BMI 20.94 kg/m   Body mass index is 20.94 kg/m.   Tobacco History  Smoking Status  . Never Smoker  Smokeless Tobacco  . Never Used     Counseling given: Not Answered   Past Medical History:  Diagnosis Date  . Allergy   . Anxiety   . Arthritis   . Constipation, chronic   . Depression   . IBS (irritable bowel syndrome)   . Insomnia   . PONV (postoperative nausea and vomiting)    severe n/v-needs scop   Past Surgical History:  Procedure Laterality Date  . COLONOSCOPY    . DILATION AND CURETTAGE OF UTERUS  2006  . FRACTURE SURGERY  1996   fx tib-fib-rt  . HARDWARE REMOVAL  1996   rt ankle  . KNEE ARTHROSCOPY  2003   lt  . KNEE ARTHROSCOPY     right  . ORIF CLAVICULAR FRACTURE Right 05/28/2013   Procedure: OPEN REDUCTION INTERNAL FIXATION (ORIF) CLAVICULAR FRACTURE;  Surgeon: Renette Butters, MD;  Location: [facility];  Service: Orthopedics;  Laterality: Right;  . TONSILLECTOMY     History reviewed. No pertinent family history. History  Sexual Activity  . Sexual activity: Yes    Outpatient Encounter Prescriptions as of 04/27/2017  Medication Sig  . Ascorbic Acid (VITAMIN C) 1000 MG tablet Take 1,000 mg by mouth daily.  Marland Kitchen atorvastatin (LIPITOR) 40 MG tablet Take 1 tablet (40 mg total) by mouth every other day.  . cetirizine (ZYRTEC) 10 MG tablet Take 1 tablet (10 mg total) by mouth daily. (Patient taking differently: Take 10 mg by mouth daily as needed. )  . Cholecalciferol (VITAMIN D3) 1000 UNITS CAPS Take by mouth daily.  . clonazePAM (KLONOPIN) 0.5 MG tablet Take 0.5-1 tablets  (0.25-0.5 mg total) by mouth 2 (two) times daily as needed for anxiety.  . diclofenac sodium (VOLTAREN) 1 % GEL Apply topically 4 (four) times daily.  . DOCOSAHEXAENOIC ACID PO Take by mouth.  . fluticasone (FLONASE) 50 MCG/ACT nasal spray Place 2 sprays into both nostrils daily.  Marland Kitchen glucosamine-chondroitin 500-400 MG tablet Take 1 tablet by mouth. 2 gel caps at breakfast  . sertraline (ZOLOFT) 50 MG tablet Take 1 tablet (50 mg total) by mouth daily.  . tamsulosin (FLOMAX) 0.4 MG CAPS capsule Take 0.4 mg by mouth.  . [DISCONTINUED] clindamycin (CLEOCIN) 150 MG capsule Take by mouth 4 (four) times daily.   No facility-administered encounter medications on file as of 04/27/2017.     Activities of Daily Living In your present state of health, do you have any difficulty performing the following activities: 04/27/2017 04/03/2017  Hearing? Y Y  Comment Patient had hearing test done recently because she has some hearing issues.  -  Vision? N N  Difficulty concentrating or making decisions? N N  Walking or climbing stairs? N N  Dressing or bathing? N N  Doing errands, shopping? N N  Preparing Food and eating ? N -  Using the Toilet? N -  In the past six months, have you accidently leaked  urine? N -  Do you have problems with loss of bowel control? N -  Managing your Medications? N -  Managing your Finances? N -  Housekeeping or managing your Housekeeping? N -  Some recent data might be hidden    Patient Care Team: [name], MD as PCP - General (Family Medicine) [name], MD as Consulting Physician (Orthopedic Surgery) [facility] (Orthopedic Surgery)    Assessment:     Exercise Activities and Dietary recommendations Current Exercise Habits: Home exercise routine, Type of exercise: walking, Time (Minutes): 60, Frequency (Times/Week): 2, Weekly Exercise (Minutes/Week): 120, Intensity: Moderate, Exercise limited by: None identified  Goals    .  Exercise 3x per week (45 min per time)          Patient wants to increase exercising to at least 3 times a week.       Fall Risk Fall Risk  04/27/2017 04/03/2017 12/26/2016 12/15/2016 09/27/2016  Falls in the past year? No No No No No   Depression Screen PHQ 2/9 Scores 04/27/2017 04/03/2017 12/26/2016 12/15/2016  PHQ - 2 Score 0 0 0 0  PHQ- 9 Score 1 - - -     Cognitive Function     6CIT Screen 04/27/2017  What Year? 0 points  What month? 0 points  What time? 0 points  Count back from 20 0 points  Months in reverse 0 points  Repeat phrase 0 points  Total Score 0    Immunization History  Administered Date(s) Administered  . Influenza Split 04/28/2012, 05/22/2013, 06/05/2014  . Influenza,inj,Quad PF,6+ Mos 05/12/2016  . Influenza-Unspecified 05/25/2015  . Pneumococcal Conjugate-13 01/12/2015, 05/31/2015  . Pneumococcal Polysaccharide-23 09/27/2016  . Td 11/22/2007  . Zoster 06/22/2010   Screening Tests Health Maintenance  Topic Date Due  . INFLUENZA VACCINE  06/27/2017 (Originally 03/22/2017)  . TETANUS/TDAP  11/21/2017  . MAMMOGRAM  01/04/2019  . COLONOSCOPY  12/19/2021  . DEXA SCAN  Completed  . Hepatitis C Screening  Completed  . PNA vac Low Risk Adult  Completed      Plan:   I have personally reviewed and noted the following in the patient's chart:   . Medical and social history . Use of alcohol, tobacco or illicit drugs  . Current medications and supplements . Functional ability and status . Nutritional status . Physical activity . Advanced directives . List of other physicians . Hospitalizations, surgeries, and ER visits in previous 12 months . Vitals . Screenings to include cognitive, depression, and falls . Referrals and appointments  In addition, I have reviewed and discussed with patient certain preventive protocols, quality metrics, and best practice recommendations. A written personalized care plan for preventive services as well as general preventive health  recommendations were provided to patient.     Andrez Grime, LPN  02/24/1950

## 2017-05-03 ENCOUNTER — Ambulatory Visit (INDEPENDENT_AMBULATORY_CARE_PROVIDER_SITE_OTHER): Payer: PPO

## 2017-05-03 ENCOUNTER — Ambulatory Visit (INDEPENDENT_AMBULATORY_CARE_PROVIDER_SITE_OTHER): Payer: PPO | Admitting: Orthopedic Surgery

## 2017-05-03 ENCOUNTER — Encounter (INDEPENDENT_AMBULATORY_CARE_PROVIDER_SITE_OTHER): Payer: Self-pay | Admitting: Orthopedic Surgery

## 2017-05-03 DIAGNOSIS — Q6671 Congenital pes cavus, right foot: Secondary | ICD-10-CM | POA: Insufficient documentation

## 2017-05-03 DIAGNOSIS — Z8781 Personal history of (healed) traumatic fracture: Secondary | ICD-10-CM

## 2017-05-03 DIAGNOSIS — M25571 Pain in right ankle and joints of right foot: Secondary | ICD-10-CM

## 2017-05-03 DIAGNOSIS — M6701 Short Achilles tendon (acquired), right ankle: Secondary | ICD-10-CM

## 2017-05-03 DIAGNOSIS — Q667 Congenital pes cavus: Secondary | ICD-10-CM

## 2017-05-03 MED ORDER — DICLOFENAC SODIUM 1 % TD GEL: 2.0000 g | Freq: Four times a day (QID) | TRANSDERMAL | 3 refills | Status: AC | PRN

## 2017-05-03 NOTE — Addendum Note (Signed)
Addended by: Meridee Score on: 05/03/2017 01:48 PM   Modules accepted: Orders
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
## 2017-06-06 ENCOUNTER — Ambulatory Visit (INDEPENDENT_AMBULATORY_CARE_PROVIDER_SITE_OTHER): Payer: PPO | Admitting: Podiatry

## 2017-06-06 VITALS — BP 133/83 | HR 71 | Ht 70.25 in | Wt 144.0 lb

## 2017-06-06 DIAGNOSIS — M7751 Other enthesopathy of right foot: Secondary | ICD-10-CM

## 2017-06-06 DIAGNOSIS — M2041 Other hammer toe(s) (acquired), right foot: Secondary | ICD-10-CM

## 2017-06-06 DIAGNOSIS — M778 Other enthesopathies, not elsewhere classified: Secondary | ICD-10-CM

## 2017-06-06 DIAGNOSIS — M779 Enthesopathy, unspecified: Principal | ICD-10-CM

## 2017-06-06 MED ORDER — METHYLPREDNISOLONE 4 MG PO TBPK: ORAL_TABLET | ORAL | 0 refills | Status: DC

## 2017-06-06 NOTE — Progress Notes (Signed)
She presents today as a new patient sheet complaint of numbness and pain beneath the second and third toes of the right foot. She states has been bothering her now for quite some time. She states this proximally in a year. She states that she saw Dr. Para March for fracture of her hallux. States that the toes of the numb since that time. States the numbness and the pain runs along the midline of the bottom of the foot and I have a hot flash or burning sensation in the foot. She states that she is very active and fit she cycles a lot. Her husband passed away one year ago.  Objective: Vital signs are stable height is 70 inches weight is 144 pounds blood pressure is 133/83 and pulse 71. Neurologic sensorium is intact pulses are strongly palpable. Deep tendon reflexes are brisk and equal bilateral muscle strength is symmetrical bilateral. Orthopedic evaluation and straight awl just distal to the ankle full range of motion without crepitation. Mild flexible hammertoe deformities 2 through 4 right foot. Pain on outpatient the second metatarsophalangeal joint and on end range of motion of the second metatarsophalangeal joint. No pain on palpation or on end range of motion of the third metatarsophalangeal joint on the right foot. Radiographs taken today demonstrate elongated second metatarsal into his foot deformity with hammertoe deformities. No open wounds or lesions no fractures identifiable. All fracture to the proximal phalanx of the hallux which appear to be intra-articular right does demonstrate osteoarthritic changes. This change is of the hallux interphalangeal joint.  Assessment: Capsulitis of the second metatarsophalangeal joint resulting in neuritis.  Plan: I injected the area today with Kenalog and local anesthetic. Start her on Medrol Dosepak discussed appropriate shoe gear stretching exercises ice therapy and shoe gear modifications. I'll follow-up with her in 6 weeks.

## 2017-07-07 ENCOUNTER — Encounter: Payer: Self-pay | Admitting: Family Medicine

## 2017-07-07 DIAGNOSIS — E785 Hyperlipidemia, unspecified: Secondary | ICD-10-CM

## 2017-07-07 MED ORDER — ATORVASTATIN CALCIUM 40 MG PO TABS: 40.0000 mg | ORAL_TABLET | ORAL | 0 refills | Status: DC

## 2017-07-11 ENCOUNTER — Telehealth: Payer: Self-pay | Admitting: Family Medicine

## 2017-07-11 NOTE — Telephone Encounter (Signed)
Copied from [place] [phone]. Topic: Quick Communication - See Telephone Encounter >> Jul 11, 2017  2:29 PM Cleaster Corin, NT wrote: CRM for notification. See Telephone encounter for:   07/11/17.Kristen [facility] called and asked could pts. order for Lipitor be changed for 90 tablets instead of 45 (1 every other day)

## 2017-07-14 ENCOUNTER — Telehealth: Payer: Self-pay | Admitting: Family Medicine

## 2017-07-14 NOTE — Telephone Encounter (Signed)
Phone call to [place]. Left detailed voicemail stating #15 tablets were sent to [facility] per clinic policy, she will need an office visit with provider before next refill. Please call for further questions. Closing note.

## 2017-07-14 NOTE — Telephone Encounter (Signed)
Copied from [place] [phone]. Topic: Inquiry >> Jul 14, 2017 11:50 AM Neva Seat wrote: [place]  Asking if pt's Atorvaftatin 40mg  - can be filled for 90 day supply instead of 1 month supply since the order is a mail order pharmacy.

## 2017-07-14 NOTE — Telephone Encounter (Signed)
Pt was not prescribed 45 tablets of Lipitor.  U/a to clarify request d/t no c/b info.

## 2017-07-14 NOTE — Telephone Encounter (Signed)
Refill request

## 2017-07-18 ENCOUNTER — Encounter: Payer: Self-pay | Admitting: Podiatry

## 2017-07-18 ENCOUNTER — Ambulatory Visit: Payer: PPO | Admitting: Podiatry

## 2017-07-18 DIAGNOSIS — M7751 Other enthesopathy of right foot: Secondary | ICD-10-CM

## 2017-07-18 DIAGNOSIS — M778 Other enthesopathies, not elsewhere classified: Secondary | ICD-10-CM

## 2017-07-18 DIAGNOSIS — M779 Enthesopathy, unspecified: Principal | ICD-10-CM

## 2017-07-18 NOTE — Progress Notes (Signed)
She presents today for follow-up of her capsulitis and neuritis to the plantar aspect of the second metatarsal phalangeal joint of the right foot. She states that is approximately 85% improved.  Objective: Vital signs are stable alert and oriented 3. Pulses are palpable. At this point she still has pinpoint tenderness on the plantar aspect of the second metatarsophalangeal joint at the plantar plate.  Assessment: Resolving capsulitis pre-dislocation syndrome right foot. Mild tenderness with neuropathy or numb sensation second metatarsophalangeal joint and her aspect.  Plan: At this point after sterile Betadine skin prep I injected the second metatarsophalangeal joint plantar aspect with 2 mg of Kenalog and 5 mg of local anesthetic. She tolerated this procedure well or Band-Aid was placed. She was provided with expectations and I will follow-up with her in the next few weeks if necessary. Discussed the possible need for orthotics.

## 2017-07-24 ENCOUNTER — Ambulatory Visit: Payer: PPO | Admitting: Family Medicine

## 2017-07-24 ENCOUNTER — Encounter: Payer: Self-pay | Admitting: Family Medicine

## 2017-07-24 ENCOUNTER — Other Ambulatory Visit: Payer: Self-pay

## 2017-07-24 VITALS — BP 100/62 | HR 67 | Temp 98.3°F | Resp 18 | Ht 70.25 in | Wt 147.2 lb

## 2017-07-24 DIAGNOSIS — E785 Hyperlipidemia, unspecified: Secondary | ICD-10-CM | POA: Diagnosis not present

## 2017-07-24 DIAGNOSIS — F439 Reaction to severe stress, unspecified: Secondary | ICD-10-CM | POA: Diagnosis not present

## 2017-07-24 DIAGNOSIS — F418 Other specified anxiety disorders: Secondary | ICD-10-CM | POA: Diagnosis not present

## 2017-07-24 DIAGNOSIS — G4700 Insomnia, unspecified: Secondary | ICD-10-CM | POA: Diagnosis not present

## 2017-07-24 MED ORDER — SERTRALINE HCL 50 MG PO TABS: 50.0000 mg | ORAL_TABLET | Freq: Every day | ORAL | 2 refills | Status: DC

## 2017-07-24 MED ORDER — ATORVASTATIN CALCIUM 40 MG PO TABS: 40.0000 mg | ORAL_TABLET | ORAL | 2 refills | Status: DC

## 2017-07-24 NOTE — Progress Notes (Signed)
Subjective:  By signing my name below, I, Moises Blood, attest that this documentation has been prepared under the direction and in the presence of Merri Ray, MD. Electronically Signed: Moises Blood, [place]. 07/24/2017 , 8:31 AM .  Patient was seen in Room 10 .   Patient ID: Erika Johns, female    DOB: 1951/05/01, 66 y.o.   MRN: [id number] Chief Complaint  Patient presents with  . Medication Refill    lipitor and wants to discuss meds she is taking    HPI Erika Johns is a 66 y.o. female Here for medication refill.   Hyperlipidemia Lab Results  Component Value Date   CHOL 178 12/26/2016   HDL 59 12/26/2016   LDLCALC 105 (H) 12/26/2016   TRIG 70 12/26/2016   CHOLHDL 3.0 12/26/2016   Lab Results  Component Value Date   ALT 19 12/26/2016   AST 21 12/26/2016   ALKPHOS 73 12/26/2016   BILITOT 0.4 12/26/2016   She is taking Lipitor 40mg  once every other Johns. She denies any new muscle aches. She denies chest pain, shortness of breath, lightheadedness or dizziness.   Depression/anxiety Last discussed in August, continued on the same dose of Zoloft 50mg  QD, exercise and counseling. She had some difficulty with sleep, and klonopin as needed; option of Belsomra. Handout was also given on insomnia last visit.   She notes she rarely takes klonopin to sleep. She denies SI or HI. She mentions Christmas was her late husband's [place]) favorite holiday. Her mother will be turning 28 years old on Jan 6th, 2019.   Patient Active Problem List   Diagnosis Date Noted  . 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/03/2017  . Achilles tendon contracture, right 05/03/2017  . KNEE PAIN 08/27/2009  . LUMBAGO 08/27/2009  . 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 HAND PAIN, LEFT 08/27/2009   Past Medical History:  Diagnosis Date  . Allergy   . Anxiety   . Arthritis   . Constipation, chronic   . Depression   . IBS (irritable bowel syndrome)   . Insomnia   . PONV  (postoperative nausea and vomiting)    severe n/v-needs scop   Past Surgical History:  Procedure Laterality Date  . COLONOSCOPY    . DILATION AND CURETTAGE OF UTERUS  2006  . FRACTURE SURGERY  1996   fx [id number]  . HARDWARE REMOVAL  1996   rt ankle  . KNEE ARTHROSCOPY  2003   lt  . KNEE ARTHROSCOPY     right  . ORIF CLAVICULAR FRACTURE Right 05/28/2013   Procedure: OPEN REDUCTION INTERNAL FIXATION (ORIF) CLAVICULAR FRACTURE;  Surgeon: Renette Butters, MD;  Location: [facility];  Service: Orthopedics;  Laterality: Right;  . TONSILLECTOMY     Allergies  Allergen Reactions  . Effexor [Venlafaxine Hydrochloride] Other (See Comments)    dizzy  . Hydrocodone Nausea And Vomiting  . Ampicillin Rash   Prior to Admission medications   Medication Sig Start Date End Date Taking? Authorizing Provider  Ascorbic Acid (VITAMIN C) 1000 MG tablet Take 1,000 mg by mouth daily.   Yes [provider]  atorvastatin (LIPITOR) 40 MG tablet Take 1 tablet (40 mg total) every other Johns by mouth. 07/07/17 08/06/17 Yes Tereasa Coop, PA-C  cetirizine (ZYRTEC) 10 MG tablet Take 1 tablet (10 mg total) by mouth daily. Patient taking differently: Take 10 mg by mouth daily as needed.  12/15/16  Yes Tenna Delaine D, PA-C  Cholecalciferol (VITAMIN D3) 1000  UNITS CAPS Take by mouth daily.   Yes [provider]  clonazePAM (KLONOPIN) 0.5 MG tablet Take 0.5-1 tablets (0.25-0.5 mg total) by mouth 2 (two) times daily as needed for anxiety. 05/12/16  Yes [name], MD  diclofenac sodium (VOLTAREN) 1 % GEL Apply topically 4 (four) times daily.   Yes [provider]  DOCOSAHEXAENOIC ACID PO Take by mouth.   Yes [provider]  fluticasone (FLONASE) 50 MCG/ACT nasal spray Place 2 sprays into both nostrils daily. 12/26/16  Yes [name], MD  glucosamine-chondroitin 500-400 MG tablet Take 1 tablet by mouth. 2 gel caps at breakfast   Yes [provider]  sertraline (ZOLOFT) 50 MG tablet Take 1 tablet (50 mg total) by mouth daily. 04/03/17  Yes [name], MD  tamsulosin (FLOMAX) 0.4 MG CAPS capsule Take 0.4 mg by mouth.   Yes [provider]   Social History   Socioeconomic History  . Marital status: Widowed    Spouse name: Not on file  . Number of children: Not on file  . Years of education: Not on file  . Highest education level: Not on file  Social Needs  . Financial resource strain: Not on file  . Food insecurity - worry: Not on file  . Food insecurity - inability: Not on file  . Transportation needs - medical: Not on file  . Transportation needs - non-medical: Not on file  Occupational History  . Not on file  Tobacco Use  . Smoking status: Never Smoker  . Smokeless tobacco: Never Used  Substance and Sexual Activity  . Alcohol use: Yes    Alcohol/week: 0.6 oz    Types: 1 Glasses of wine per week  . Drug use: No  . Sexual activity: Yes  Other Topics Concern  . Not on file  Social History Narrative  . Not on file   Review of Systems  Constitutional: Negative for fatigue and unexpected weight change.  Respiratory: Negative for chest tightness and shortness of breath.   Cardiovascular: Negative for chest pain, palpitations and leg swelling.  Gastrointestinal: Negative for abdominal pain and blood in stool.  Neurological: Negative for dizziness, syncope, light-headedness and headaches.       Objective:   Physical Exam  Constitutional: She is oriented to person, place, and time. She appears well-developed and well-nourished.  HENT:  Head: Normocephalic and atraumatic.  Eyes: Conjunctivae and EOM are normal. Pupils are equal, round, and reactive to light.  Neck: Carotid bruit is not present.  Cardiovascular: Normal rate, regular rhythm, normal heart sounds and intact distal pulses.  Pulmonary/Chest: Effort normal and breath sounds normal.  Abdominal: Soft. She exhibits no pulsatile  midline mass. There is no tenderness.  Neurological: She is alert and oriented to person, place, and time.  Skin: Skin is warm and dry.  Psychiatric: She has a normal mood and affect. Her behavior is normal.  Vitals reviewed.   Vitals:   07/24/17 0811  BP: 100/62  Pulse: 67  Resp: 18  Temp: 98.3 F (36.8 C)  TempSrc: Oral  SpO2: 96%  Weight: 147 lb 3.2 oz (66.8 kg)  Height: 5' 10.25" (1.784 m)      Assessment & Plan:   Erika Johns is a 66 y.o. female Insomnia, unspecified type  - May be a combination of sleep hygiene as well as psychological insomnia. Allergies Klonopin intermittently. Okay to refill as needed until next office visit in 6-9 months. However other over-the-counter options  and sleep hygiene treatment was discussed. Handout given. RTC precautions if worse  Hyperlipidemia, unspecified hyperlipidemia type - Plan: Comprehensive metabolic panel, Lipid panel, atorvastatin (LIPITOR) 40 MG tablet  -Tolerating Lipitor every other Johns. Repeat labs, but does have been overall stable so anticipate extending out next lab check to 9-12 months from now.  Depression with anxiety - Plan: sertraline (ZOLOFT) 50 MG tablet Situational stress - Plan: sertraline (ZOLOFT) 50 MG tablet  -Overall stable, over one year since passing of her husband.  Continue same dose of Zoloft.   Meds ordered this encounter  Medications  . atorvastatin (LIPITOR) 40 MG tablet    Sig: Take 1 tablet (40 mg total) by mouth every other Johns.    Dispense:  90 tablet    Refill:  2  . sertraline (ZOLOFT) 50 MG tablet    Sig: Take 1 tablet (50 mg total) by mouth daily.    Dispense:  90 tablet    Refill:  2   Patient Instructions   Ok to try over the counter options such as Tylenol pm ok short term if needed or Klonopin if needed to help get to sleep. See sleep info below as well. Return to the clinic if those symptoms worsen.   No change in other meds for now. Thanks for coming in  today!  Insomnia Insomnia is a sleep disorder that makes it difficult to fall asleep or to stay asleep. Insomnia can cause tiredness (fatigue), low energy, difficulty concentrating, mood swings, and poor performance at work or school. There are three different ways to classify insomnia:  Difficulty falling asleep.  Difficulty staying asleep.  Waking up too early in the morning.  Any type of insomnia can be long-term (chronic) or short-term (acute). Both are common. Short-term insomnia usually lasts for three months or less. Chronic insomnia occurs at least three times a week for longer than three months. What are the causes? Insomnia may be caused by another condition, situation, or substance, such as:  Anxiety.  Certain medicines.  Gastroesophageal reflux disease (GERD) or other gastrointestinal conditions.  Asthma or other breathing conditions.  Restless legs syndrome, sleep apnea, or other sleep disorders.  Chronic pain.  Menopause. This may include hot flashes.  Stroke.  Abuse of alcohol, tobacco, or illegal drugs.  Depression.  Caffeine.  Neurological disorders, such as Alzheimer disease.  An overactive thyroid (hyperthyroidism).  The cause of insomnia may not be known. What increases the risk? Risk factors for insomnia include:  Gender. Women are more commonly affected than men.  Age. Insomnia is more common as you get older.  Stress. This may involve your professional or personal life.  Income. Insomnia is more common in people with lower income.  Lack of exercise.  Irregular work schedule or night shifts.  Traveling between different time zones.  What are the signs or symptoms? If you have insomnia, trouble falling asleep or trouble staying asleep is the main symptom. This may lead to other symptoms, such as:  Feeling fatigued.  Feeling nervous about going to sleep.  Not feeling rested in the morning.  Having trouble  concentrating.  Feeling irritable, anxious, or depressed.  How is this treated? Treatment for insomnia depends on the cause. If your insomnia is caused by an underlying condition, treatment will focus on addressing the condition. Treatment may also include:  Medicines to help you sleep.  Counseling or therapy.  Lifestyle adjustments.  Follow these instructions at home:  Take medicines only as directed by your  health care provider.  Keep regular sleeping and waking hours. Avoid naps.  Keep a sleep diary to help you and your health care provider figure out what could be causing your insomnia. Include: ? When you sleep. ? When you wake up during the night. ? How well you sleep. ? How rested you feel the next Johns. ? Any side effects of medicines you are taking. ? What you eat and drink.  Make your bedroom a comfortable place where it is easy to fall asleep: ? Put up shades or special blackout curtains to block light from outside. ? Use a white noise machine to block noise. ? Keep the temperature cool.  Exercise regularly as directed by your health care provider. Avoid exercising right before bedtime.  Use relaxation techniques to manage stress. Ask your health care provider to suggest some techniques that may work well for you. These may include: ? Breathing exercises. ? Routines to release muscle tension. ? Visualizing peaceful scenes.  Cut back on alcohol, caffeinated beverages, and cigarettes, especially close to bedtime. These can disrupt your sleep.  Do not overeat or eat spicy foods right before bedtime. This can lead to digestive discomfort that can make it hard for you to sleep.  Limit screen use before bedtime. This includes: ? Watching TV. ? Using your smartphone, tablet, and computer.  Stick to a routine. This can help you fall asleep faster. Try to do a quiet activity, brush your teeth, and go to bed at the same time each night.  Get out of bed if you are  still awake after 15 minutes of trying to sleep. Keep the lights down, but try reading or doing a quiet activity. When you feel sleepy, go back to bed.  Make sure that you drive carefully. Avoid driving if you feel very sleepy.  Keep all follow-up appointments as directed by your health care provider. This is important. Contact a health care provider if:  You are tired throughout the Johns or have trouble in your daily routine due to sleepiness.  You continue to have sleep problems or your sleep problems get worse. Get help right away if:  You have serious thoughts about hurting yourself or someone else. This information is not intended to replace advice given to you by your health care provider. Make sure you discuss any questions you have with your health care provider. Document Released: 08/05/2000 Document Revised: 01/08/2016 Document Reviewed: 05/09/2014 Elsevier Interactive Patient Education  2018 [employer].    IF you received an x-ray today, you will receive an invoice from [facility]. Please contact [facility] at [phone] with questions or concerns regarding your invoice.   IF you received labwork today, you will receive an invoice from [facility]. Please contact [facility] at [phone] with questions or concerns regarding your invoice.   Our billing staff will not be able to assist you with questions regarding bills from these companies.  You will be contacted with the lab results as soon as they are available. The fastest way to get your results is to activate your My Chart account. Instructions are located on the last page of this paperwork. If you have not heard from [name] regarding the results in 2 weeks, please contact this office.      I personally performed the services described in this documentation, which was scribed in my presence. The recorded information has been reviewed and considered for accuracy and completeness, addended by me as needed,  and agree with information above.  Signed,   Merri Ray, MD [facility].  07/24/17 8:42 AM

## 2017-07-24 NOTE — Patient Instructions (Addendum)
Ok to try over the counter options such as Tylenol pm ok short term if needed or Klonopin if needed to help get to sleep. See sleep info below as well. Return to the clinic if those symptoms worsen.   No change in other meds for now. Thanks for coming in today!  Insomnia Insomnia is a sleep disorder that makes it difficult to fall asleep or to stay asleep. Insomnia can cause tiredness (fatigue), low energy, difficulty concentrating, mood swings, and poor performance at work or school. There are three different ways to classify insomnia:  Difficulty falling asleep.  Difficulty staying asleep.  Waking up too early in the morning.  Any type of insomnia can be long-term (chronic) or short-term (acute). Both are common. Short-term insomnia usually lasts for three months or less. Chronic insomnia occurs at least three times a week for longer than three months. What are the causes? Insomnia may be caused by another condition, situation, or substance, such as:  Anxiety.  Certain medicines.  Gastroesophageal reflux disease (GERD) or other gastrointestinal conditions.  Asthma or other breathing conditions.  Restless legs syndrome, sleep apnea, or other sleep disorders.  Chronic pain.  Menopause. This may include hot flashes.  Stroke.  Abuse of alcohol, tobacco, or illegal drugs.  Depression.  Caffeine.  Neurological disorders, such as Alzheimer disease.  An overactive thyroid (hyperthyroidism).  The cause of insomnia may not be known. What increases the risk? Risk factors for insomnia include:  Gender. Women are more commonly affected than men.  Age. Insomnia is more common as you get older.  Stress. This may involve your professional or personal life.  Income. Insomnia is more common in people with lower income.  Lack of exercise.  Irregular work schedule or night shifts.  Traveling between different time zones.  What are the signs or symptoms? If you have  insomnia, trouble falling asleep or trouble staying asleep is the main symptom. This may lead to other symptoms, such as:  Feeling fatigued.  Feeling nervous about going to sleep.  Not feeling rested in the morning.  Having trouble concentrating.  Feeling irritable, anxious, or depressed.  How is this treated? Treatment for insomnia depends on the cause. If your insomnia is caused by an underlying condition, treatment will focus on addressing the condition. Treatment may also include:  Medicines to help you sleep.  Counseling or therapy.  Lifestyle adjustments.  Follow these instructions at home:  Take medicines only as directed by your health care provider.  Keep regular sleeping and waking hours. Avoid naps.  Keep a sleep diary to help you and your health care provider figure out what could be causing your insomnia. Include: ? When you sleep. ? When you wake up during the night. ? How well you sleep. ? How rested you feel the next day. ? Any side effects of medicines you are taking. ? What you eat and drink.  Make your bedroom a comfortable place where it is easy to fall asleep: ? Put up shades or special blackout curtains to block light from outside. ? Use a white noise machine to block noise. ? Keep the temperature cool.  Exercise regularly as directed by your health care provider. Avoid exercising right before bedtime.  Use relaxation techniques to manage stress. Ask your health care provider to suggest some techniques that may work well for you. These may include: ? Breathing exercises. ? Routines to release muscle tension. ? Visualizing peaceful scenes.  Cut back on alcohol, caffeinated beverages,  and cigarettes, especially close to bedtime. These can disrupt your sleep.  Do not overeat or eat spicy foods right before bedtime. This can lead to digestive discomfort that can make it hard for you to sleep.  Limit screen use before bedtime. This  includes: ? Watching TV. ? Using your smartphone, tablet, and computer.  Stick to a routine. This can help you fall asleep faster. Try to do a quiet activity, brush your teeth, and go to bed at the same time each night.  Get out of bed if you are still awake after 15 minutes of trying to sleep. Keep the lights down, but try reading or doing a quiet activity. When you feel sleepy, go back to bed.  Make sure that you drive carefully. Avoid driving if you feel very sleepy.  Keep all follow-up appointments as directed by your health care provider. This is important. Contact a health care provider if:  You are tired throughout the day or have trouble in your daily routine due to sleepiness.  You continue to have sleep problems or your sleep problems get worse. Get help right away if:  You have serious thoughts about hurting yourself or someone else. This information is not intended to replace advice given to you by your health care provider. Make sure you discuss any questions you have with your health care provider. Document Released: 08/05/2000 Document Revised: 01/08/2016 Document Reviewed: 05/09/2014 Elsevier Interactive Patient Education  2018 [employer].    IF you received an x-ray today, you will receive an invoice from [facility]. Please contact [facility] at [phone] with questions or concerns regarding your invoice.   IF you received labwork today, you will receive an invoice from [facility]. Please contact [facility] at [phone] with questions or concerns regarding your invoice.   Our billing staff will not be able to assist you with questions regarding bills from these companies.  You will be contacted with the lab results as soon as they are available. The fastest way to get your results is to activate your My Chart account. Instructions are located on the last page of this paperwork. If you have not heard from [name] regarding the results in 2 weeks,  please contact this office.

## 2017-07-25 DIAGNOSIS — M8589 Other specified disorders of bone density and structure, multiple sites: Secondary | ICD-10-CM | POA: Diagnosis not present

## 2017-07-25 LAB — COMPREHENSIVE METABOLIC PANEL
ALK PHOS: 62 IU/L (ref 39–117)
ALT: 15 IU/L (ref 0–32)
AST: 21 IU/L (ref 0–40)
Albumin/Globulin Ratio: 2.3 — ABNORMAL HIGH (ref 1.2–2.2)
Albumin: 4.3 g/dL (ref 3.6–4.8)
BUN/Creatinine Ratio: 17 (ref 12–28)
BUN: 14 mg/dL (ref 8–27)
Bilirubin Total: 0.5 mg/dL (ref 0.0–1.2)
CALCIUM: 9 mg/dL (ref 8.7–10.3)
CO2: 25 mmol/L (ref 20–29)
CREATININE: 0.81 mg/dL (ref 0.57–1.00)
Chloride: 105 mmol/L (ref 96–106)
GFR calc Af Amer: 87 mL/min/{1.73_m2} (ref >59)
GFR, EST NON AFRICAN AMERICAN: 75 mL/min/{1.73_m2} (ref >59)
GLUCOSE: 84 mg/dL (ref 65–99)
Globulin, Total: 1.9 g/dL (ref 1.5–4.5)
Potassium: 4.2 mmol/L (ref 3.5–5.2)
SODIUM: 143 mmol/L (ref 134–144)
Total Protein: 6.2 g/dL (ref 6.0–8.5)

## 2017-07-25 LAB — LIPID PANEL
CHOLESTEROL TOTAL: 174 mg/dL (ref 100–199)
Chol/HDL Ratio: 2.4 ratio (ref 0.0–4.4)
HDL: 73 mg/dL (ref >39)
LDL CALC: 89 mg/dL (ref 0–99)
Triglycerides: 60 mg/dL (ref 0–149)
VLDL CHOLESTEROL CAL: 12 mg/dL (ref 5–40)

## 2017-09-21 DIAGNOSIS — Z01419 Encounter for gynecological examination (general) (routine) without abnormal findings: Secondary | ICD-10-CM | POA: Diagnosis not present

## 2017-09-28 ENCOUNTER — Encounter: Payer: Self-pay | Admitting: Podiatry

## 2017-09-28 ENCOUNTER — Ambulatory Visit (INDEPENDENT_AMBULATORY_CARE_PROVIDER_SITE_OTHER): Payer: PPO | Admitting: Podiatry

## 2017-09-28 DIAGNOSIS — M779 Enthesopathy, unspecified: Principal | ICD-10-CM

## 2017-09-28 DIAGNOSIS — M778 Other enthesopathies, not elsewhere classified: Secondary | ICD-10-CM

## 2017-09-28 DIAGNOSIS — M79676 Pain in unspecified toe(s): Secondary | ICD-10-CM

## 2017-09-28 DIAGNOSIS — M7751 Other enthesopathy of right foot: Secondary | ICD-10-CM

## 2017-09-29 DIAGNOSIS — H04123 Dry eye syndrome of bilateral lacrimal glands: Secondary | ICD-10-CM | POA: Diagnosis not present

## 2017-09-29 DIAGNOSIS — H353131 Nonexudative age-related macular degeneration, bilateral, early dry stage: Secondary | ICD-10-CM | POA: Diagnosis not present

## 2017-09-29 DIAGNOSIS — H35371 Puckering of macula, right eye: Secondary | ICD-10-CM | POA: Diagnosis not present

## 2017-10-01 NOTE — Progress Notes (Signed)
She presents today for follow-up after having not seen her since November.  She states that she still has pain and tenderness and a lot of numbness with hot flashes in her right forefoot.  Objective: Vital signs are stable alert and oriented x3.  Pulses are palpable.  Neurologic sensorium is intact she has pain on palpation and range of motion of the second and third metatarsophalangeal joints.  Assessment: Capsulitis neuritis forefoot right.  Plan: Recommended orthotics she was scanned today for orthotics.

## 2017-10-11 DIAGNOSIS — H35432 Paving stone degeneration of retina, left eye: Secondary | ICD-10-CM | POA: Diagnosis not present

## 2017-10-11 DIAGNOSIS — H43811 Vitreous degeneration, right eye: Secondary | ICD-10-CM | POA: Diagnosis not present

## 2017-10-11 DIAGNOSIS — H35373 Puckering of macula, bilateral: Secondary | ICD-10-CM | POA: Diagnosis not present

## 2017-10-11 DIAGNOSIS — H35362 Drusen (degenerative) of macula, left eye: Secondary | ICD-10-CM | POA: Diagnosis not present

## 2017-10-12 ENCOUNTER — Encounter: Payer: Self-pay | Admitting: Family Medicine

## 2017-10-12 ENCOUNTER — Ambulatory Visit: Payer: PPO | Admitting: Family Medicine

## 2017-10-12 ENCOUNTER — Other Ambulatory Visit: Payer: Self-pay

## 2017-10-12 VITALS — BP 96/61 | HR 70 | Temp 97.6°F | Resp 16 | Ht 70.25 in | Wt 147.2 lb

## 2017-10-12 DIAGNOSIS — F439 Reaction to severe stress, unspecified: Secondary | ICD-10-CM

## 2017-10-12 DIAGNOSIS — E785 Hyperlipidemia, unspecified: Secondary | ICD-10-CM | POA: Diagnosis not present

## 2017-10-12 DIAGNOSIS — F418 Other specified anxiety disorders: Secondary | ICD-10-CM | POA: Diagnosis not present

## 2017-10-12 DIAGNOSIS — G4700 Insomnia, unspecified: Secondary | ICD-10-CM | POA: Diagnosis not present

## 2017-10-12 DIAGNOSIS — R351 Nocturia: Secondary | ICD-10-CM

## 2017-10-12 MED ORDER — SUVOREXANT 10 MG PO TABS: 10.0000 mg | ORAL_TABLET | Freq: Every evening | ORAL | 2 refills | Status: DC | PRN

## 2017-10-12 MED ORDER — SERTRALINE HCL 50 MG PO TABS: 50.0000 mg | ORAL_TABLET | Freq: Every day | ORAL | 0 refills | Status: DC

## 2017-10-12 MED ORDER — CLONAZEPAM 0.5 MG PO TABS: 0.2500 mg | ORAL_TABLET | Freq: Two times a day (BID) | ORAL | 1 refills | Status: DC | PRN

## 2017-10-12 NOTE — Patient Instructions (Addendum)
  Increase water intake during earlier in the day - try to avoid large amount of fluids within a few hours of bedtime.   Stay on 50mg  Zoloft once per day. Klonopin if needed for anxiety or at bedtime if needed. Can try Belsomra to see if that provides better sleep. Do not combine with klonopin.   Follow up in about 6 months. Let me know if there are questions prior to that time.  IF you received an x-ray today, you will receive an invoice from [facility]. Please contact [facility] at [phone] with questions or concerns regarding your invoice.   IF you received labwork today, you will receive an invoice from [facility]. Please contact [facility] at [phone] with questions or concerns regarding your invoice.   Our billing staff will not be able to assist you with questions regarding bills from these companies.  You will be contacted with the lab results as soon as they are available. The fastest way to get your results is to activate your My Chart account. Instructions are located on the last page of this paperwork. If you have not heard from [name] regarding the results in 2 weeks, please contact this office.

## 2017-10-12 NOTE — Progress Notes (Signed)
Subjective:  By signing my name below, I, Erika Johns, attest that this documentation has been prepared under the direction and in the presence of Erika Raspberry Ranell Patrick, MD.  Electronically Signed: Theresia Johns, Medical Scribe 10/12/17 at 8:49 AM   Patient was seen in Room 10   Patient ID: Erika Johns, female    DOB: October 01, 1950, 67 y.o.   MRN: [id number] Chief Complaint  Patient presents with  . Medication Refill    patient would like refills for Klonopin and Zoloft   HPI Erika Johns is a 67 y.o. female who presents to Primary Care at [facility] today for medication refill for her depression/anxiety symptoms. She has a medical history of HLD and depression/anxiety. She was last here July 24, 2017.   Depression/Anxiety with Insomnia  I had reported overall stable symptoms, continuing on 50 mg Zoloft QD. She was suffering some insomnia; sleep hygiene was discussed and Klonopin provided.   Today, she states that she has been doing well on Zoloft and is requesting to continue at her dose as she ran out yesterday. She notes that she is still having trouble sleeping. Per pt, the issue is not falling asleep but when she wakes up in the middle of the night to use the bathroom and she cannot get back to sleep. She reports that she has been drinking more water daily and that is causing her to wake up to urinate on average 1-2 times each night. She has tried tylenol PM with no relief. She notes that she is using Klonopin at bed time and seldomly uses it during the day time. She reports she has been exercising by walking and going to yoga class.  Personally, she states she has been visiting her mother in [place] for her mother's 100th birthday and continuing to help out in her family.   Hyperlipidemia  She takes Lipitor 40 mg every other day.    Lipid Panel     Component Value Date/Time   CHOL 174 07/24/2017 1155   TRIG 60 07/24/2017 1155   HDL 73 07/24/2017 1155     CHOLHDL 2.4 07/24/2017 1155   CHOLHDL 3.1 06/07/2016 0901   VLDL 17 06/07/2016 0901   LDLCALC 89 07/24/2017 1155   Lab Results  Component Value Date   ALT 15 07/24/2017   AST 21 07/24/2017   ALKPHOS 62 07/24/2017   BILITOT 0.5 07/24/2017   Health Maintenance She has had her yearly OBGYN appointment, dental cleaning and eye appointment since her last appointment.    Patient Active Problem List   Diagnosis Date Noted  . 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/03/2017  . Achilles tendon contracture, right 05/03/2017  . KNEE PAIN 08/27/2009  . LUMBAGO 08/27/2009  . 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 HAND PAIN, LEFT 08/27/2009   Past Medical History:  Diagnosis Date  . Allergy   . Anxiety   . Arthritis   . Constipation, chronic   . Depression   . IBS (irritable bowel syndrome)   . Insomnia   . PONV (postoperative nausea and vomiting)    severe n/v-needs scop   Past Surgical History:  Procedure Laterality Date  . COLONOSCOPY    . DILATION AND CURETTAGE OF UTERUS  2006  . FRACTURE SURGERY  1996   fx [id number]  . HARDWARE REMOVAL  1996   rt ankle  . KNEE ARTHROSCOPY  2003   lt  . KNEE ARTHROSCOPY     right  . ORIF CLAVICULAR FRACTURE Right 05/28/2013  Procedure: OPEN REDUCTION INTERNAL FIXATION (ORIF) CLAVICULAR FRACTURE;  Surgeon: Renette Butters, MD;  Location: [facility];  Service: Orthopedics;  Laterality: Right;  . TONSILLECTOMY     Allergies  Allergen Reactions  . Effexor [Venlafaxine Hydrochloride] Other (See Comments)    dizzy  . Hydrocodone Nausea And Vomiting  . Ampicillin Rash   Prior to Admission medications   Medication Sig Start Date End Date Taking? Authorizing Provider  Ascorbic Acid (VITAMIN C) 1000 MG tablet Take 1,000 mg by mouth daily.    [provider]  atorvastatin (LIPITOR) 40 MG tablet Take 1 tablet (40 mg total) by mouth every other day. 07/24/17 08/23/17  [name], MD  cetirizine (ZYRTEC) 10 MG tablet Take  1 tablet (10 mg total) by mouth daily. Patient taking differently: Take 10 mg by mouth daily as needed.  12/15/16   Tenna Delaine D, PA-C  Cholecalciferol (VITAMIN D3) 1000 UNITS CAPS Take by mouth daily.    [provider]  clonazePAM (KLONOPIN) 0.5 MG tablet Take 0.5-1 tablets (0.25-0.5 mg total) by mouth 2 (two) times daily as needed for anxiety. 05/12/16   [name], MD  diclofenac sodium (VOLTAREN) 1 % GEL Apply topically 4 (four) times daily.    [provider]  DOCOSAHEXAENOIC ACID PO Take by mouth.    [provider]  fluticasone (FLONASE) 50 MCG/ACT nasal spray Place 2 sprays into both nostrils daily. 12/26/16   [name], MD  glucosamine-chondroitin 500-400 MG tablet Take 1 tablet by mouth. 2 gel caps at breakfast    [provider]  sertraline (ZOLOFT) 50 MG tablet Take 1 tablet (50 mg total) by mouth daily. 07/24/17   [name], MD  tamsulosin (FLOMAX) 0.4 MG CAPS capsule Take 0.4 mg by mouth.    [provider]   Social History   Socioeconomic History  . Marital status: Widowed    Spouse name: Not on file  . Number of children: Not on file  . Years of education: Not on file  . Highest education level: Not on file  Social Needs  . Financial resource strain: Not on file  . Food insecurity - worry: Not on file  . Food insecurity - inability: Not on file  . Transportation needs - medical: Not on file  . Transportation needs - non-medical: Not on file  Occupational History  . Not on file  Tobacco Use  . Smoking status: Never Smoker  . Smokeless tobacco: Never Used  Substance and Sexual Activity  . Alcohol use: Yes    Alcohol/week: 0.6 oz    Types: 1 Glasses of wine per week  . Drug use: No  . Sexual activity: Yes  Other Topics Concern  . Not on file  Social History Narrative  . Not on file    Review of Systems  Gastrointestinal: Negative for abdominal pain.  Endocrine: Negative for polyuria.    Genitourinary: Negative for difficulty urinating and dysuria.       (+) nocturia   Neurological: Negative for dizziness and light-headedness.  Psychiatric/Behavioral: Positive for sleep disturbance.       (+) depression, (+) anxiety       Objective:   Physical Exam  Constitutional: She is oriented to person, place, and time. She appears well-developed and well-nourished. No distress.  HENT:  Head: Normocephalic and atraumatic.  Eyes: Conjunctivae and EOM are normal. Pupils are equal, round, and reactive to light. No scleral icterus.  Neck: Neck supple.  Carotid bruit is not present.  Cardiovascular: Normal rate, regular rhythm, normal heart sounds and intact distal pulses.  Pulmonary/Chest: Effort normal and breath sounds normal. No respiratory distress.  Abdominal: Soft. She exhibits no distension and no pulsatile midline mass. There is no tenderness.  Neurological: She is alert and oriented to person, place, and time.  Skin: Skin is warm and dry. She is not diaphoretic.  Psychiatric: She has a normal mood and affect. Her behavior is normal.  Vitals reviewed.   Vitals:   10/12/17 0822  BP: 96/61  Pulse: 70  Resp: 16  Temp: 97.6 F (36.4 C)  TempSrc: Oral  SpO2: 98%  Weight: 147 lb 3.2 oz (66.8 kg)  Height: 5' 10.25" (1.784 m)        Assessment & Plan:   Erika Johns is a 67 y.o. female Depression with anxiety - Plan: sertraline (ZOLOFT) 50 MG tablet, clonazePAM (KLONOPIN) 0.5 MG tablet Insomnia, unspecified type - Plan: Suvorexant (BELSOMRA) 10 MG TABS Situational stress - Plan: sertraline (ZOLOFT) 50 MG tablet, clonazePAM (KLONOPIN) 0.5 MG tablet Nocturia  - Depression/anxiety overall controlled on Zoloft. Continue same dose for now. Early wakening may be component of fluid intake and need for voiding early in the morning, then anxiety symptoms.  -Recommended restricting water intake before bedtime, increase water intake during the day.  -Trial of  Belsomra, coupon given first 3 trials of 10 mg  -Klonopin prescribed if needed for breakthrough anxiety symptoms, or can take separate from the [place]. Do not combine.  Hyperlipidemia, unspecified hyperlipidemia type  - Tolerating Lipitor every other day, recent labwork reassuring.  Borderline low BP in office, asymptomatic, advised to return if any lightheadedness, dizziness, orthostatic symptoms.   Meds ordered this encounter  Medications  . sertraline (ZOLOFT) 50 MG tablet    Sig: Take 1 tablet (50 mg total) by mouth daily.    Dispense:  30 tablet    Refill:  0  . clonazePAM (KLONOPIN) 0.5 MG tablet    Sig: Take 0.5-1 tablets (0.25-0.5 mg total) by mouth 2 (two) times daily as needed for anxiety.    Dispense:  45 tablet    Refill:  1  . Suvorexant (BELSOMRA) 10 MG TABS    Sig: Take 10 mg by mouth at bedtime as needed.    Dispense:  10 tablet    Refill:  2   Patient Instructions    Increase water intake during earlier in the day - try to avoid large amount of fluids within a few hours of bedtime.   Stay on 50mg  Zoloft once per day. Klonopin if needed for anxiety or at bedtime if needed. Can try Belsomra to see if that provides better sleep. Do not combine with klonopin.   Follow up in about 6 months. Let me know if there are questions prior to that time.  IF you received an x-ray today, you will receive an invoice from [facility]. Please contact [facility] at [phone] with questions or concerns regarding your invoice.   IF you received labwork today, you will receive an invoice from [facility]. Please contact [facility] at [phone] with questions or concerns regarding your invoice.   Our billing staff will not be able to assist you with questions regarding bills from these companies.  You will be contacted with the lab results as soon as they are available. The fastest way to get your results is to activate your My Chart account. Instructions are  located on the last page of  this paperwork. If you have not heard from [name] regarding the results in 2 weeks, please contact this office.      I personally performed the services described in this documentation, which was scribed in my presence. The recorded information has been reviewed and considered for accuracy and completeness, addended by me as needed, and agree with information above.  Signed,   Merri Ray, MD Primary Care at [facility].  10/12/17 8:59 AM

## 2017-11-02 ENCOUNTER — Ambulatory Visit (INDEPENDENT_AMBULATORY_CARE_PROVIDER_SITE_OTHER): Payer: PPO | Admitting: Orthotics

## 2017-11-02 DIAGNOSIS — M779 Enthesopathy, unspecified: Principal | ICD-10-CM

## 2017-11-02 DIAGNOSIS — M778 Other enthesopathies, not elsewhere classified: Secondary | ICD-10-CM

## 2017-11-02 DIAGNOSIS — M2041 Other hammer toe(s) (acquired), right foot: Secondary | ICD-10-CM

## 2017-11-02 NOTE — Progress Notes (Signed)
Patient came in today to pick up custom made foot orthotics.  The goals were accomplished and the patient reported no dissatisfaction with said orthotics.  Patient was advised of breakin period and how to report any issues. 

## 2017-12-25 ENCOUNTER — Encounter: Payer: Self-pay | Admitting: Sports Medicine

## 2017-12-25 ENCOUNTER — Ambulatory Visit (INDEPENDENT_AMBULATORY_CARE_PROVIDER_SITE_OTHER): Payer: PPO | Admitting: Sports Medicine

## 2017-12-25 DIAGNOSIS — M25571 Pain in right ankle and joints of right foot: Secondary | ICD-10-CM | POA: Diagnosis not present

## 2017-12-25 DIAGNOSIS — M1712 Unilateral primary osteoarthritis, left knee: Secondary | ICD-10-CM

## 2017-12-25 DIAGNOSIS — M25579 Pain in unspecified ankle and joints of unspecified foot: Secondary | ICD-10-CM | POA: Insufficient documentation

## 2017-12-25 MED ORDER — DICLOFENAC SODIUM 1 % TD GEL: 2.0000 g | Freq: Four times a day (QID) | TRANSDERMAL | 0 refills | Status: AC | PRN

## 2017-12-25 NOTE — Assessment & Plan Note (Signed)
XR and evaluation at Orthopedic office  On exam this is not severe and warrants conservative Rx

## 2017-12-25 NOTE — Assessment & Plan Note (Signed)
Use custom orthotic w modification  Reassured that I think MTP 1 joint is not primary issue  Seems like neurapraxia

## 2017-12-25 NOTE — Progress Notes (Signed)
   HPI  CC: Right foot warmth and numbness, Left knee arthritis  Right foot warmth and numbness Patient reports this has been a gradually worsening problem over the past 1-2 years. She reports that she has had intermittent "hot flashes" with burning discomfort and numbness in the right foot between the first and second digits radiating to the top of the foot. Recently this has progressed to include burning discomfort and numbness that also radiates to the bottom of the foot between the first and second digits. She endorses having seen orthopedics in the past and was prescribed supportive shoes and exercises. She additionally saw podiatry and underwent two cortisone injections and was then prescribed orthotics. She reports that the discomfort has continued to progress which led her to come into the office today.  Left knee arthritic pain - this has been a chronic issue for the patient. Not currently in exacerbation. She questions whether knee replacement surgery is warranted at this time.Pain was severe during time husband was terminally ill. Now less pain and able to hike.  CC, family, smoking history reviewed.  ROS: Per HPI; in addition no fever, no rash, no additional weakness, no additional numbness, no additional paresthesias, and no additional falls/injury.   Objective: BP 114/70   Ht 5' 10.25" (1.784 m)   Wt 147 lb (66.7 kg)   BMI 20.94 kg/m  Gen: NAD, well groomed, a/o x3, normal affect.  CV: Well-perfused. Warm.  Resp: Non-labored.  Neuro: Sensation intact throughout. No gross coordination deficits.  Gait: unremarkable stride without signs of limp or balance issues. Right foot: No edema, erythema or effusion. No gross sensory defects. No pain with compression of the toes. +Right great toe with halux limitus 20 degrees extension, 15 degrees flexion. +Upon standing, +splaying noted between toes 2 and 3. +transverse arch collapse, +knuckle pad noted over second toe, +second hammer toe.    Left knee: No effusion, edema or erythema. No joint line tenderness to palpation. No pain with patellar compression. When compared to RT knee the ROM mildly limited with 150 degrees in flexion and 4 degrees in extension. Strength 5/5 at quadriceps and hamstrings.  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 Her orthotics have some metatarsal support which was enhanced with an overlying metatarsal pad at today's visit to cushion and take pressure off of the transverse arch.  - continue metatarsal padding - recommend 50-100 mg B6 to help with nerve sheath healing - patient can try hammer toe cushioning for additional support  2. Left knee arthritic pain  - due to patient's relatively preserved range of motion, likely does not need right knee replacement surgery at this time. She uses diclofenac gel which she feels helps with her osteoarthritic pain. - refilled diclofenac gel   Meds ordered this encounter  Medications  . diclofenac sodium (VOLTAREN) 1 % GEL    Sig: Apply 2 g topically 4 (four) times daily as needed (left knee pain).    Dispense:  100 g    Refill:  0    Everrett Coombe, MD PGY-2 [place] [facility] I observed and examined the patient with the resident and agree with assessment and plan.  Note reviewed and modified by me. Stefanie Libel, MD

## 2017-12-26 ENCOUNTER — Ambulatory Visit: Payer: Self-pay | Admitting: Sports Medicine

## 2018-02-07 DIAGNOSIS — Z1231 Encounter for screening mammogram for malignant neoplasm of breast: Secondary | ICD-10-CM | POA: Diagnosis not present

## 2018-02-07 DIAGNOSIS — Z803 Family history of malignant neoplasm of breast: Secondary | ICD-10-CM | POA: Diagnosis not present

## 2018-02-07 LAB — HM MAMMOGRAPHY

## 2018-02-13 ENCOUNTER — Ambulatory Visit: Payer: PPO | Admitting: Podiatry

## 2018-02-13 ENCOUNTER — Encounter: Payer: Self-pay | Admitting: Podiatry

## 2018-02-13 DIAGNOSIS — M779 Enthesopathy, unspecified: Secondary | ICD-10-CM | POA: Diagnosis not present

## 2018-02-13 DIAGNOSIS — M778 Other enthesopathies, not elsewhere classified: Secondary | ICD-10-CM

## 2018-02-14 NOTE — Progress Notes (Signed)
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  States that still bother me to some degree.  She is interested in ordering a set of orthotics for dress shoes.  Objective: Vital signs are stable alert and oriented x3.  Still has pain on palpation to the second metatarsal phalangeal joint of the right foot.  Pulses remain palpable good range of motion.  No crepitation.  No erythema cellulitis drainage or odor.  Assessment: Capsulitis second metatarsophalangeal joint right foot.  Plan: Reinjected the area today with 20 mg Kenalog 5 mg Marcaine around the joint.  Tolerated procedure well without complications.  Follow-up with me on an as-needed basis.  Was taken to see Lil[facility] today for orthotics.

## 2018-02-19 ENCOUNTER — Ambulatory Visit (INDEPENDENT_AMBULATORY_CARE_PROVIDER_SITE_OTHER): Payer: PPO | Admitting: Orthotics

## 2018-02-19 DIAGNOSIS — M779 Enthesopathy, unspecified: Secondary | ICD-10-CM

## 2018-02-19 DIAGNOSIS — M2041 Other hammer toe(s) (acquired), right foot: Secondary | ICD-10-CM

## 2018-02-19 DIAGNOSIS — M5137 Other intervertebral disc degeneration, lumbosacral region: Secondary | ICD-10-CM | POA: Diagnosis not present

## 2018-02-19 DIAGNOSIS — M6701 Short Achilles tendon (acquired), right ankle: Secondary | ICD-10-CM

## 2018-02-19 DIAGNOSIS — M47816 Spondylosis without myelopathy or radiculopathy, lumbar region: Secondary | ICD-10-CM | POA: Diagnosis not present

## 2018-02-19 NOTE — Progress Notes (Signed)
Ordered pair of dress f/o, sent shoe into [facility].

## 2018-03-08 ENCOUNTER — Encounter: Payer: Self-pay | Admitting: *Deleted

## 2018-03-12 ENCOUNTER — Ambulatory Visit: Payer: PPO | Admitting: Orthotics

## 2018-03-12 DIAGNOSIS — M2041 Other hammer toe(s) (acquired), right foot: Secondary | ICD-10-CM

## 2018-03-12 DIAGNOSIS — M779 Enthesopathy, unspecified: Secondary | ICD-10-CM

## 2018-03-12 DIAGNOSIS — M778 Other enthesopathies, not elsewhere classified: Secondary | ICD-10-CM

## 2018-03-12 NOTE — Progress Notes (Signed)
Patient came in today to pick up custom made foot orthotics.  The goals were accomplished and the patient reported no dissatisfaction with said orthotics.  Patient was advised of breakin period and how to report any issues. 

## 2018-03-16 ENCOUNTER — Telehealth: Payer: Self-pay | Admitting: Family Medicine

## 2018-03-16 NOTE — Telephone Encounter (Signed)
Copied from [place] [id number]. Topic: Quick Communication - Rx Refill/Question >> Mar 16, 2018  3:37 PM Bea Graff, NT wrote: Medication: sertraline (ZOLOFT) 50 MG tablet    Pt states that she did not receive the refill from [facility] and has been to the post office and on the phone with the pharmacy. They stated to see if the Dr can send in a emergency [facility] local [facility]. Pt is almost out of the medication.   Has the patient contacted their pharmacy? Yes.   (Agent: If no, request that the patient contact the pharmacy for the refill.) (Agent: If yes, when and what did the pharmacy advise?)  Preferred Pharmacy (with phone number or street name): [id number] [place], [address] [place] [phone] (Phone) [phone] (Fax)      Agent: Please be advised that RX refills may take up to 3 business days. We ask that you follow-up with your pharmacy.

## 2018-03-20 NOTE — Telephone Encounter (Signed)
Spoke to patient [facility] is sending prescription via [employer].     Advised patient to call us if she wants [name] to send it to [address]. She stated she is going to wait and see if it comes by [employer].

## 2018-03-22 ENCOUNTER — Encounter: Payer: Self-pay | Admitting: Family Medicine

## 2018-03-22 ENCOUNTER — Ambulatory Visit (INDEPENDENT_AMBULATORY_CARE_PROVIDER_SITE_OTHER): Payer: PPO | Admitting: Family Medicine

## 2018-03-22 ENCOUNTER — Other Ambulatory Visit: Payer: Self-pay

## 2018-03-22 VITALS — BP 103/65 | HR 65 | Temp 98.2°F | Ht 70.0 in | Wt 146.0 lb

## 2018-03-22 DIAGNOSIS — Z23 Encounter for immunization: Secondary | ICD-10-CM

## 2018-03-22 DIAGNOSIS — F418 Other specified anxiety disorders: Secondary | ICD-10-CM

## 2018-03-22 DIAGNOSIS — G4700 Insomnia, unspecified: Secondary | ICD-10-CM

## 2018-03-22 MED ORDER — SUVOREXANT 10 MG PO TABS: 30.0000 mg | ORAL_TABLET | Freq: Every evening | ORAL | 2 refills | Status: DC | PRN

## 2018-03-22 MED ORDER — ZOSTER VAC RECOMB ADJUVANTED 50 MCG/0.5ML IM SUSR: 0.5000 mL | Freq: Once | INTRAMUSCULAR | 1 refills | Status: AC

## 2018-03-22 NOTE — Progress Notes (Signed)
Subjective:    Patient ID: Erika Johns, female    DOB: September 01, 1950, 67 y.o.   MRN: [id number]  HPI  Erika Johns is a 67 y.o. female Presents today for: Chief Complaint  Patient presents with  . medication question    tdap? shingles booster?  . life stressors    coninue to take zoloft?    Hx of depression with anxiety. Treated with zoloft and continued 50 mg daily at last office visit in February.  Also prescribed Klonopin as needed for breakthrough symptoms, and also discussed insomnia with option for Belsomra as well as restricting fluid intake just before bedtime to minimize early awakening.  Went to [place] in May.  Was feeling well so decided to try 25mg  dose of Zoloft. Felt ok on that dose. Some difficulty getting most recent Rx due to incorrect address/zipcode. Trying to make meds last.  Did decrease to 1/2 of 25mg  (12.5mg  temporarily).  Now has refill of 90 day supply of 50mg .  Thinks she would like to remain Did have crying/meltdown regarding memory of Sonia Side last weekend, but was in [place], near his exwife and his son and stressful in that area.   1 year and 9 months since [place] passed. She has been spending time with new friend, but realized not the right time for someone new.  10 pills of belsomra lasted 1 and 1/2 months. Sleeping good overall. No recent need for klonopin. No side effects or parasomnias with belsomra. Would like refill.   Mother going to assisted living in [place]. Planning on bringing special needs sister to [place], to live at independent living facility.    Patient Active Problem List   Diagnosis Date Noted  . Pain in joint, ankle and foot 12/25/2017  . 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/03/2017  . Achilles tendon contracture, right 05/03/2017  . Osteoarthritis of left knee 08/27/2009  . LUMBAGO 08/27/2009  . 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 HAND PAIN, LEFT 08/27/2009   Past Medical History:  Diagnosis Date  . Allergy     . Anxiety   . Arthritis   . Constipation, chronic   . Depression   . IBS (irritable bowel syndrome)   . Insomnia   . PONV (postoperative nausea and vomiting)    severe n/v-needs scop   Past Surgical History:  Procedure Laterality Date  . COLONOSCOPY    . DILATION AND CURETTAGE OF UTERUS  2006  . FRACTURE SURGERY  1996   fx [id number]  . HARDWARE REMOVAL  1996   rt ankle  . KNEE ARTHROSCOPY  2003   lt  . KNEE ARTHROSCOPY     right  . ORIF CLAVICULAR FRACTURE Right 05/28/2013   Procedure: OPEN REDUCTION INTERNAL FIXATION (ORIF) CLAVICULAR FRACTURE;  Surgeon: Renette Butters, MD;  Location: [facility];  Service: Orthopedics;  Laterality: Right;  . TONSILLECTOMY     Allergies  Allergen Reactions  . Effexor [Venlafaxine Hydrochloride] Other (See Comments)    dizzy  . Hydrocodone Nausea And Vomiting  . Ampicillin Rash   Prior to Admission medications   Medication Sig Start Date End Date Taking? Authorizing Provider  Ascorbic Acid (VITAMIN C) 1000 MG tablet Take 1,000 mg by mouth daily.   Yes [provider]  Cholecalciferol (VITAMIN D3) 1000 UNITS CAPS Take by mouth daily.   Yes [provider]  clonazePAM (KLONOPIN) 0.5 MG tablet Take 0.5-1 tablets (0.25-0.5 mg total) by mouth 2 (two) times daily as needed for anxiety. 10/12/17  Yes [name], MD  diclofenac (VOLTAREN) 75 MG EC tablet TAKE 1 TABLET 2X A DAY WITH FOOD X 2 WKS THEN 1 DAILY WITH FOOD X 2 WKS THEN 1 DAILY AS NEEDED 12/09/17  Yes [provider]  diclofenac sodium (VOLTAREN) 1 % GEL Apply 2 g topically 4 (four) times daily as needed (left knee pain). 12/25/17  Yes Everrett Coombe, MD  DOCOSAHEXAENOIC ACID PO Take by mouth.   Yes [provider]  glucosamine-chondroitin 500-400 MG tablet Take 1 tablet by mouth. 2 gel caps at breakfast   Yes [provider]  sertraline (ZOLOFT) 50 MG tablet Take 1 tablet (50 mg total) by mouth daily. Patient taking  differently: Take 25 mg by mouth daily.  10/12/17  Yes [name], MD  Suvorexant (BELSOMRA) 10 MG TABS Take 10 mg by mouth at bedtime as needed. 10/12/17  Yes [name], MD  tamsulosin (FLOMAX) 0.4 MG CAPS capsule Take 0.4 mg by mouth.   Yes [provider]  atorvastatin (LIPITOR) 40 MG tablet Take 1 tablet (40 mg total) by mouth every other day. 07/24/17 08/23/17  [name], MD   Social History   Socioeconomic History  . Marital status: Widowed    Spouse name: Not on file  . Number of children: Not on file  . Years of education: Not on file  . Highest education level: Not on file  Occupational History  . Not on file  Social Needs  . Financial resource strain: Not on file  . Food insecurity:    Worry: Not on file    Inability: Not on file  . Transportation needs:    Medical: Not on file    Non-medical: Not on file  Tobacco Use  . Smoking status: Never Smoker  . Smokeless tobacco: Never Used  Substance and Sexual Activity  . Alcohol use: Yes    Alcohol/week: 0.6 oz    Types: 1 Glasses of wine per week  . Drug use: No  . Sexual activity: Yes  Lifestyle  . Physical activity:    Days per week: Not on file    Minutes per session: Not on file  . Stress: Not on file  Relationships  . Social connections:    Talks on phone: Not on file    Gets together: Not on file    Attends religious service: Not on file    Active member of club or organization: Not on file    Attends meetings of clubs or organizations: Not on file    Relationship status: Not on file  . Intimate partner violence:    Fear of current or ex partner: Not on file    Emotionally abused: Not on file    Physically abused: Not on file    Forced sexual activity: Not on file  Other Topics Concern  . Not on file  Social History Narrative  . Not on file    Review of Systems  Psychiatric/Behavioral: Positive for sleep disturbance.   As above in HPI.     Objective:   Physical Exam    Constitutional: She is oriented to person, place, and time. She appears well-developed and well-nourished.  HENT:  Head: Normocephalic and atraumatic.  Eyes: Pupils are equal, round, and reactive to light. Conjunctivae and EOM are normal.  Neck: Carotid bruit is not present. No thyromegaly present.  Cardiovascular: Normal rate, regular rhythm, normal heart sounds and intact distal pulses.  Pulmonary/Chest: Effort normal and breath sounds  normal.  Abdominal: Soft. She exhibits no pulsatile midline mass. There is no tenderness.  Neurological: She is alert and oriented to person, place, and time.  Skin: Skin is warm and dry.  Psychiatric: She has a normal mood and affect. Her behavior is normal.  Vitals reviewed.  Vitals:   03/22/18 1651  BP: 103/65  Pulse: 65  Temp: 98.2 F (36.8 C)  TempSrc: Oral  SpO2: 93%  Weight: 146 lb (66.2 kg)  Height: 5\' 10"  (1.778 m)        Assessment & Plan:   Erika Johns is a 68 y.o. female Depression with anxiety  -Decided to continue on Zoloft 25 mg daily.  Has refills available.  Recent flare of symptoms more likely situational.  Still could consider trial off medicines in the future if remains stable.    Insomnia, unspecified type - Plan: Suvorexant (BELSOMRA) 10 MG TABS  -Stable with intermittent use of Belsomra.  denies parasomnias or significant side effects.  Refilled  Need for Tdap vaccination - Plan: Tdap vaccine greater than or equal to 7yo IM  Need for shingles vaccine - Plan: Zoster Vaccine Adjuvanted [facility] injection sent to pharmacy  Plan for recheck next few months for fasting lipids, other blood work   Meds ordered this encounter  Medications  . Suvorexant (BELSOMRA) 10 MG TABS    Sig: Take 30 mg by mouth at bedtime as needed.    Dispense:  10 tablet    Refill:  2  . Zoster Vaccine Adjuvanted [facility]) injection    Sig: Inject 0.5 mLs into the muscle once for 1 dose. Repeat in 2-6 months.    Dispense:   0.5 mL    Refill:  1   Patient Instructions    It may be worth continuing 25mg  dose of Zoloft once per day. If you feel more anxiety symptoms, can increase up to 50mg .  Let me know when a refill is needed.  Ok to continue Belsomra as needed for sleep.   Recheck in next few months for fasting blood work and to discuss cholesterol med then.   Tetanus vaccine given today, shingles vaccine sent to your pahrmacy.   Insomnia Insomnia is a sleep disorder that makes it difficult to fall asleep or to stay asleep. Insomnia can cause tiredness (fatigue), low energy, difficulty concentrating, mood swings, and poor performance at work or school. There are three different ways to classify insomnia:  Difficulty falling asleep.  Difficulty staying asleep.  Waking up too early in the morning.  Any type of insomnia can be long-term (chronic) or short-term (acute). Both are common. Short-term insomnia usually lasts for three months or less. Chronic insomnia occurs at least three times a week for longer than three months. What are the causes? Insomnia may be caused by another condition, situation, or substance, such as:  Anxiety.  Certain medicines.  Gastroesophageal reflux disease (GERD) or other gastrointestinal conditions.  Asthma or other breathing conditions.  Restless legs syndrome, sleep apnea, or other sleep disorders.  Chronic pain.  Menopause. This may include hot flashes.  Stroke.  Abuse of alcohol, tobacco, or illegal drugs.  Depression.  Caffeine.  Neurological disorders, such as Alzheimer disease.  An overactive thyroid (hyperthyroidism).  The cause of insomnia may not be known. What increases the risk? Risk factors for insomnia include:  Gender. Women are more commonly affected than men.  Age. Insomnia is more common as you get older.  Stress. This may involve your professional or personal life.  Income. Insomnia is more common in people with lower  income.  Lack of exercise.  Irregular work schedule or night shifts.  Traveling between different time zones.  What are the signs or symptoms? If you have insomnia, trouble falling asleep or trouble staying asleep is the main symptom. This may lead to other symptoms, such as:  Feeling fatigued.  Feeling nervous about going to sleep.  Not feeling rested in the morning.  Having trouble concentrating.  Feeling irritable, anxious, or depressed.  How is this treated? Treatment for insomnia depends on the cause. If your insomnia is caused by an underlying condition, treatment will focus on addressing the condition. Treatment may also include:  Medicines to help you sleep.  Counseling or therapy.  Lifestyle adjustments.  Follow these instructions at home:  Take medicines only as directed by your health care provider.  Keep regular sleeping and waking hours. Avoid naps.  Keep a sleep diary to help you and your health care provider figure out what could be causing your insomnia. Include: ? When you sleep. ? When you wake up during the night. ? How well you sleep. ? How rested you feel the next day. ? Any side effects of medicines you are taking. ? What you eat and drink.  Make your bedroom a comfortable place where it is easy to fall asleep: ? Put up shades or special blackout curtains to block light from outside. ? Use a white noise machine to block noise. ? Keep the temperature cool.  Exercise regularly as directed by your health care provider. Avoid exercising right before bedtime.  Use relaxation techniques to manage stress. Ask your health care provider to suggest some techniques that may work well for you. These may include: ? Breathing exercises. ? Routines to release muscle tension. ? Visualizing peaceful scenes.  Cut back on alcohol, caffeinated beverages, and cigarettes, especially close to bedtime. These can disrupt your sleep.  Do not overeat or eat spicy  foods right before bedtime. This can lead to digestive discomfort that can make it hard for you to sleep.  Limit screen use before bedtime. This includes: ? Watching TV. ? Using your smartphone, tablet, and computer.  Stick to a routine. This can help you fall asleep faster. Try to do a quiet activity, brush your teeth, and go to bed at the same time each night.  Get out of bed if you are still awake after 15 minutes of trying to sleep. Keep the lights down, but try reading or doing a quiet activity. When you feel sleepy, go back to bed.  Make sure that you drive carefully. Avoid driving if you feel very sleepy.  Keep all follow-up appointments as directed by your health care provider. This is important. Contact a health care provider if:  You are tired throughout the day or have trouble in your daily routine due to sleepiness.  You continue to have sleep problems or your sleep problems get worse. Get help right away if:  You have serious thoughts about hurting yourself or someone else. This information is not intended to replace advice given to you by your health care provider. Make sure you discuss any questions you have with your health care provider. Document Released: 08/05/2000 Document Revised: 01/08/2016 Document Reviewed: 05/09/2014 Elsevier Interactive Patient Education  2018 [employer].   IF you received an x-ray today, you will receive an invoice from [facility] Radiology. Please contact [facility] Radiology at [phone] with questions or concerns regarding your invoice.  IF you received labwork today, you will receive an invoice from [facility]. Please contact [facility] at [phone] with questions or concerns regarding your invoice.   Our billing staff will not be able to assist you with questions regarding bills from these companies.  You will be contacted with the lab results as soon as they are available. The fastest way to get your results is to activate your  My Chart account. Instructions are located on the last page of this paperwork. If you have not heard from [name] regarding the results in 2 weeks, please contact this office.       Signed,   Merri Ray, MD [facility].  03/25/18 12:01 PM

## 2018-03-22 NOTE — Patient Instructions (Addendum)
It may be worth continuing 25mg  dose of Zoloft once per day. If you feel more anxiety symptoms, can increase up to 50mg .  Let me know when a refill is needed.  Ok to continue Belsomra as needed for sleep.   Recheck in next few months for fasting blood work and to discuss cholesterol med then.   Tetanus vaccine given today, shingles vaccine sent to your pahrmacy.   Insomnia Insomnia is a sleep disorder that makes it difficult to fall asleep or to stay asleep. Insomnia can cause tiredness (fatigue), low energy, difficulty concentrating, mood swings, and poor performance at work or school. There are three different ways to classify insomnia:  Difficulty falling asleep.  Difficulty staying asleep.  Waking up too early in the morning.  Any type of insomnia can be long-term (chronic) or short-term (acute). Both are common. Short-term insomnia usually lasts for three months or less. Chronic insomnia occurs at least three times a week for longer than three months. What are the causes? Insomnia may be caused by another condition, situation, or substance, such as:  Anxiety.  Certain medicines.  Gastroesophageal reflux disease (GERD) or other gastrointestinal conditions.  Asthma or other breathing conditions.  Restless legs syndrome, sleep apnea, or other sleep disorders.  Chronic pain.  Menopause. This may include hot flashes.  Stroke.  Abuse of alcohol, tobacco, or illegal drugs.  Depression.  Caffeine.  Neurological disorders, such as Alzheimer disease.  An overactive thyroid (hyperthyroidism).  The cause of insomnia may not be known. What increases the risk? Risk factors for insomnia include:  Gender. Women are more commonly affected than men.  Age. Insomnia is more common as you get older.  Stress. This may involve your professional or personal life.  Income. Insomnia is more common in people with lower income.  Lack of exercise.  Irregular work schedule or  night shifts.  Traveling between different time zones.  What are the signs or symptoms? If you have insomnia, trouble falling asleep or trouble staying asleep is the main symptom. This may lead to other symptoms, such as:  Feeling fatigued.  Feeling nervous about going to sleep.  Not feeling rested in the morning.  Having trouble concentrating.  Feeling irritable, anxious, or depressed.  How is this treated? Treatment for insomnia depends on the cause. If your insomnia is caused by an underlying condition, treatment will focus on addressing the condition. Treatment may also include:  Medicines to help you sleep.  Counseling or therapy.  Lifestyle adjustments.  Follow these instructions at home:  Take medicines only as directed by your health care provider.  Keep regular sleeping and waking hours. Avoid naps.  Keep a sleep diary to help you and your health care provider figure out what could be causing your insomnia. Include: ? When you sleep. ? When you wake up during the night. ? How well you sleep. ? How rested you feel the next day. ? Any side effects of medicines you are taking. ? What you eat and drink.  Make your bedroom a comfortable place where it is easy to fall asleep: ? Put up shades or special blackout curtains to block light from outside. ? Use a white noise machine to block noise. ? Keep the temperature cool.  Exercise regularly as directed by your health care provider. Avoid exercising right before bedtime.  Use relaxation techniques to manage stress. Ask your health care provider to suggest some techniques that may work well for you. These may include: ? Breathing  exercises. ? Routines to release muscle tension. ? Visualizing peaceful scenes.  Cut back on alcohol, caffeinated beverages, and cigarettes, especially close to bedtime. These can disrupt your sleep.  Do not overeat or eat spicy foods right before bedtime. This can lead to digestive  discomfort that can make it hard for you to sleep.  Limit screen use before bedtime. This includes: ? Watching TV. ? Using your smartphone, tablet, and computer.  Stick to a routine. This can help you fall asleep faster. Try to do a quiet activity, brush your teeth, and go to bed at the same time each night.  Get out of bed if you are still awake after 15 minutes of trying to sleep. Keep the lights down, but try reading or doing a quiet activity. When you feel sleepy, go back to bed.  Make sure that you drive carefully. Avoid driving if you feel very sleepy.  Keep all follow-up appointments as directed by your health care provider. This is important. Contact a health care provider if:  You are tired throughout the day or have trouble in your daily routine due to sleepiness.  You continue to have sleep problems or your sleep problems get worse. Get help right away if:  You have serious thoughts about hurting yourself or someone else. This information is not intended to replace advice given to you by your health care provider. Make sure you discuss any questions you have with your health care provider. Document Released: 08/05/2000 Document Revised: 01/08/2016 Document Reviewed: 05/09/2014 Elsevier Interactive Patient Education  2018 [employer].   IF you received an x-ray today, you will receive an invoice from [facility] Radiology. Please contact [facility] Radiology at [phone] with questions or concerns regarding your invoice.   IF you received labwork today, you will receive an invoice from [place]. Please contact [facility] at [phone] with questions or concerns regarding your invoice.   Our billing staff will not be able to assist you with questions regarding bills from these companies.  You will be contacted with the lab results as soon as they are available. The fastest way to get your results is to activate your My Chart account. Instructions are located on the last  page of this paperwork. If you have not heard from [name] regarding the results in 2 weeks, please contact this office.

## 2018-03-25 ENCOUNTER — Encounter: Payer: Self-pay | Admitting: Family Medicine

## 2018-04-05 DIAGNOSIS — R3914 Feeling of incomplete bladder emptying: Secondary | ICD-10-CM | POA: Diagnosis not present

## 2018-04-05 DIAGNOSIS — R339 Retention of urine, unspecified: Secondary | ICD-10-CM | POA: Diagnosis not present

## 2018-06-05 ENCOUNTER — Encounter: Payer: Self-pay | Admitting: Family Medicine

## 2018-06-05 ENCOUNTER — Ambulatory Visit: Payer: PPO | Admitting: Family Medicine

## 2018-06-05 VITALS — BP 109/58 | HR 72 | Temp 97.8°F | Ht 70.0 in | Wt 147.0 lb

## 2018-06-05 DIAGNOSIS — Z872 Personal history of diseases of the skin and subcutaneous tissue: Secondary | ICD-10-CM | POA: Diagnosis not present

## 2018-06-05 DIAGNOSIS — K5900 Constipation, unspecified: Secondary | ICD-10-CM | POA: Diagnosis not present

## 2018-06-05 DIAGNOSIS — E785 Hyperlipidemia, unspecified: Secondary | ICD-10-CM | POA: Diagnosis not present

## 2018-06-05 DIAGNOSIS — F418 Other specified anxiety disorders: Secondary | ICD-10-CM | POA: Diagnosis not present

## 2018-06-05 LAB — COMPREHENSIVE METABOLIC PANEL
ALT: 20 IU/L (ref 0–32)
AST: 24 IU/L (ref 0–40)
Albumin/Globulin Ratio: 2.4 — ABNORMAL HIGH (ref 1.2–2.2)
Albumin: 4.4 g/dL (ref 3.6–4.8)
Alkaline Phosphatase: 63 IU/L (ref 39–117)
BUN/Creatinine Ratio: 21 (ref 12–28)
BUN: 17 mg/dL (ref 8–27)
Bilirubin Total: 0.5 mg/dL (ref 0.0–1.2)
CALCIUM: 9.1 mg/dL (ref 8.7–10.3)
CO2: 25 mmol/L (ref 20–29)
CREATININE: 0.81 mg/dL (ref 0.57–1.00)
Chloride: 104 mmol/L (ref 96–106)
GFR calc Af Amer: 86 mL/min/{1.73_m2} (ref >59)
GFR, EST NON AFRICAN AMERICAN: 75 mL/min/{1.73_m2} (ref >59)
GLOBULIN, TOTAL: 1.8 g/dL (ref 1.5–4.5)
Glucose: 79 mg/dL (ref 65–99)
Potassium: 3.7 mmol/L (ref 3.5–5.2)
SODIUM: 144 mmol/L (ref 134–144)
Total Protein: 6.2 g/dL (ref 6.0–8.5)

## 2018-06-05 LAB — LIPID PANEL
CHOLESTEROL TOTAL: 186 mg/dL (ref 100–199)
Chol/HDL Ratio: 2.9 ratio (ref 0.0–4.4)
HDL: 64 mg/dL (ref >39)
LDL CALC: 109 mg/dL — AB (ref 0–99)
Triglycerides: 67 mg/dL (ref 0–149)
VLDL Cholesterol Cal: 13 mg/dL (ref 5–40)

## 2018-06-05 MED ORDER — ATORVASTATIN CALCIUM 40 MG PO TABS: 40.0000 mg | ORAL_TABLET | ORAL | 2 refills | Status: DC

## 2018-06-05 MED ORDER — HALOBETASOL PROPIONATE 0.05 % EX CREA: TOPICAL_CREAM | Freq: Two times a day (BID) | CUTANEOUS | 0 refills | Status: DC

## 2018-06-05 NOTE — Patient Instructions (Addendum)
I will refer you to gastroenterology to discuss constipation further.  Continue Citrucel, MiraLAX up to once per day, fiber and fluids in the diet.  See other information below.  No other change in medications.  I will check the cholesterol testing today.  Follow-up in February.  Thank you for coming in today.   Constipation, Adult Constipation is when a person has fewer bowel movements in a week than normal, has difficulty having a bowel movement, or has stools that are dry, hard, or larger than normal. Constipation may be caused by an underlying condition. It may become worse with age if a person takes certain medicines and does not take in enough fluids. Follow these instructions at home: Eating and drinking   Eat foods that have a lot of fiber, such as fresh fruits and vegetables, whole grains, and beans.  Limit foods that are high in fat, low in fiber, or overly processed, such as french fries, hamburgers, cookies, candies, and soda.  Drink enough fluid to keep your urine clear or pale yellow. General instructions  Exercise regularly or as told by your health care provider.  Go to the restroom when you have the urge to go. Do not hold it in.  Take over-the-counter and prescription medicines only as told by your health care provider. These include any fiber supplements.  Practice pelvic floor retraining exercises, such as deep breathing while relaxing the lower abdomen and pelvic floor relaxation during bowel movements.  Watch your condition for any changes.  Keep all follow-up visits as told by your health care provider. This is important. Contact a health care provider if:  You have pain that gets worse.  You have a fever.  You do not have a bowel movement after 4 days.  You vomit.  You are not hungry.  You lose weight.  You are bleeding from the anus.  You have thin, pencil-like stools. Get help right away if:  You have a fever and your symptoms suddenly get  worse.  You leak stool or have blood in your stool.  Your abdomen is bloated.  You have severe pain in your abdomen.  You feel dizzy or you faint. This information is not intended to replace advice given to you by your health care provider. Make sure you discuss any questions you have with your health care provider. Document Released: 05/06/2004 Document Revised: 02/26/2016 Document Reviewed: 01/27/2016 Elsevier Interactive Patient Education  [employer].   If you have lab work done today you will be contacted with your lab results within the next 2 weeks.  If you have not heard from [name] then please contact us. The fastest way to get your results is to register for My Chart.   IF you received an x-ray today, you will receive an invoice from [facility] Radiology. Please contact [facility] Radiology at [phone] with questions or concerns regarding your invoice.   IF you received labwork today, you will receive an invoice from [facility]. Please contact [facility] at [phone] with questions or concerns regarding your invoice.   Our billing staff will not be able to assist you with questions regarding bills from these companies.  You will be contacted with the lab results as soon as they are available. The fastest way to get your results is to activate your My Chart account. Instructions are located on the last page of this paperwork. If you have not heard from [name] regarding the results in 2 weeks, please contact this office.

## 2018-06-05 NOTE — Progress Notes (Signed)
Subjective:    Patient ID: Erika Johns, female    DOB: Dec 30, 1950, 67 y.o.   MRN: [id number]  HPI Erika Johns is a 67 y.o. female Presents today for: Chief Complaint  Patient presents with  . Hyperlipidemia    f/u     Hyperlipidemia: She is taking Lipitor 40 mg every other day. No new side effects, no new myalgias.   Lab Results  Component Value Date   CHOL 174 07/24/2017   HDL 73 07/24/2017   LDLCALC 89 07/24/2017   TRIG 60 07/24/2017   CHOLHDL 2.4 07/24/2017   Lab Results  Component Value Date   ALT 15 07/24/2017   AST 21 07/24/2017   ALKPHOS 62 07/24/2017   BILITOT 0.5 07/24/2017   History of contact dermatitis with poison ivy, has used steroid cream in the past for mild symptoms.  Requesting refill. Has been prescribed halobetasol 0.05% only as needed (Rx by Dr. Jarome Matin).  Uses for  few days only.   Depression with anxiety: Last discussed in August, decided to remain on Zoloft 25 mg daily at that time. Feels like that is stable dose. Has some situational anxiety prior to that visit, has Klonopin as needed.  Belsomra was helping with sleep with intermittent use - works 80% of the time.   Constipation: History of constipation, prior GI Dr. Wynetta Emery - retired. Has had to use fleet enema up to every 4 days. Uses citrucel every morning, miralax QOD. Last BM yesterday. Having once per day - hard stools mostly. Tried Linzess in past, but did not tolerate. No fever, no blood in stool.    Patient Active Problem List   Diagnosis Date Noted  . Pain in joint, ankle and foot 12/25/2017  . 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/03/2017  . Achilles tendon contracture, right 05/03/2017  . Osteoarthritis of left knee 08/27/2009  . LUMBAGO 08/27/2009  . 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 HAND PAIN, LEFT 08/27/2009   Past Medical History:  Diagnosis Date  . Allergy   . Anxiety   . Arthritis   . Constipation, chronic   . Depression   . IBS  (irritable bowel syndrome)   . Insomnia   . PONV (postoperative nausea and vomiting)    severe n/v-needs scop   Past Surgical History:  Procedure Laterality Date  . COLONOSCOPY    . DILATION AND CURETTAGE OF UTERUS  2006  . FRACTURE SURGERY  1996   fx tib-fib-rt  . HARDWARE REMOVAL  1996   rt ankle  . KNEE ARTHROSCOPY  2003   lt  . KNEE ARTHROSCOPY     right  . ORIF CLAVICULAR FRACTURE Right 05/28/2013   Procedure: OPEN REDUCTION INTERNAL FIXATION (ORIF) CLAVICULAR FRACTURE;  Surgeon: Renette Butters, MD;  Location: [facility];  Service: Orthopedics;  Laterality: Right;  . TONSILLECTOMY     Allergies  Allergen Reactions  . Effexor [Venlafaxine Hydrochloride] Other (See Comments)    dizzy  . Hydrocodone Nausea And Vomiting  . Ampicillin Rash   Prior to Admission medications   Medication Sig Start Date End Date Taking? Authorizing Provider  Ascorbic Acid (VITAMIN C) 1000 MG tablet Take 1,000 mg by mouth daily.   Yes [provider]  Cholecalciferol (VITAMIN D3) 1000 UNITS CAPS Take by mouth daily.   Yes [provider]  clonazePAM (KLONOPIN) 0.5 MG tablet Take 0.5-1 tablets (0.25-0.5 mg total) by mouth 2 (two) times daily as needed for anxiety. 10/12/17  Yes Carlota Raspberry,  Ranell Patrick, MD  diclofenac (VOLTAREN) 75 MG EC tablet TAKE 1 TABLET 2X A DAY WITH FOOD X 2 WKS THEN 1 DAILY WITH FOOD X 2 WKS THEN 1 DAILY AS NEEDED 12/09/17  Yes [provider]  diclofenac sodium (VOLTAREN) 1 % GEL Apply 2 g topically 4 (four) times daily as needed (left knee pain). 12/25/17  Yes Everrett Coombe, MD  DOCOSAHEXAENOIC ACID PO Take by mouth.   Yes [provider]  glucosamine-chondroitin 500-400 MG tablet Take 1 tablet by mouth. 2 gel caps at breakfast   Yes [provider]  sertraline (ZOLOFT) 50 MG tablet Take 1 tablet (50 mg total) by mouth daily. Patient taking differently: Take 25 mg by mouth daily.  10/12/17  Yes [name], MD  Suvorexant  (BELSOMRA) 10 MG TABS Take 30 mg by mouth at bedtime as needed. 03/22/18  Yes [name], MD  tamsulosin (FLOMAX) 0.4 MG CAPS capsule Take 0.4 mg by mouth.   Yes [provider]  atorvastatin (LIPITOR) 40 MG tablet Take 1 tablet (40 mg total) by mouth every other day. 07/24/17 08/23/17  [name], MD   Social History   Socioeconomic History  . Marital status: Widowed    Spouse name: Not on file  . Number of children: Not on file  . Years of education: Not on file  . Highest education level: Not on file  Occupational History  . Not on file  Social Needs  . Financial resource strain: Not on file  . Food insecurity:    Worry: Not on file    Inability: Not on file  . Transportation needs:    Medical: Not on file    Non-medical: Not on file  Tobacco Use  . Smoking status: Never Smoker  . Smokeless tobacco: Never Used  Substance and Sexual Activity  . Alcohol use: Yes    Alcohol/week: 1.0 standard drinks    Types: 1 Glasses of wine per week  . Drug use: No  . Sexual activity: Yes  Lifestyle  . Physical activity:    Days per week: Not on file    Minutes per session: Not on file  . Stress: Not on file  Relationships  . Social connections:    Talks on phone: Not on file    Gets together: Not on file    Attends religious service: Not on file    Active member of club or organization: Not on file    Attends meetings of clubs or organizations: Not on file    Relationship status: Not on file  . Intimate partner violence:    Fear of current or ex partner: Not on file    Emotionally abused: Not on file    Physically abused: Not on file    Forced sexual activity: Not on file  Other Topics Concern  . Not on file  Social History Narrative  . Not on file     Review of Systems  Constitutional: Negative for chills and fever.  Gastrointestinal: Positive for constipation. Negative for abdominal pain, anal bleeding, blood in stool, nausea and vomiting.    Musculoskeletal: Negative for myalgias.       Objective:   Physical Exam  Constitutional: She is oriented to person, place, and time. She appears well-developed and well-nourished.  HENT:  Head: Normocephalic and atraumatic.  Eyes: Pupils are equal, round, and reactive to light. Conjunctivae and EOM are normal.  Neck: Carotid bruit is not present.  Cardiovascular: Normal rate,  regular rhythm, normal heart sounds and intact distal pulses.  Pulmonary/Chest: Effort normal and breath sounds normal.  Abdominal: Soft. She exhibits no pulsatile midline mass. There is no tenderness.  Neurological: She is alert and oriented to person, place, and time.  Skin: Skin is warm and dry.  Psychiatric: She has a normal mood and affect. Her behavior is normal.  Vitals reviewed.  Vitals:   06/05/18 0829  BP: (!) 109/58  Pulse: 72  Temp: 97.8 F (36.6 C)  TempSrc: Oral  SpO2: 96%  Weight: 147 lb (66.7 kg)  Height: 5\' 10"  (1.778 m)       Assessment & Plan:    KAYSE PUCCINI is a 67 y.o. female Hyperlipidemia, unspecified hyperlipidemia type - Plan: Lipid panel, Comprehensive metabolic panel  -Tolerating every other day dosing of Lipitor, continue same.  Refilled.  Labs pending  History of contact dermatitis - Plan: halobetasol (ULTRAVATE) 0.05 % cream  -Agreed to refill Ultravate, but discussed potency of that steroid, minimal use only as needed.  Potential side effects discussed.  Depression with anxiety  -Reports stable symptoms, tolerating current dose of Zoloft and Belsomra.  Recheck 4 months  Constipation, unspecified constipation type - Plan: Ambulatory referral to Gastroenterology  -Constipation predominance.  Did not tolerate Linzess previously.  Refer to gastroenterology to discuss other options, continue fiber in diet, Citrucel, MiraLAX as needed, handout given.  RTC precautions  Meds ordered this encounter  Medications  . halobetasol (ULTRAVATE) 0.05 % cream     Sig: Apply topically 2 (two) times daily.    Dispense:  50 g    Refill:  0   Patient Instructions     I will refer you to gastroenterology to discuss constipation further.  Continue Citrucel, MiraLAX up to once per day, fiber and fluids in the diet.  See other information below.  No other change in medications.  I will check the cholesterol testing today.  Follow-up in February.  Thank you for coming in today.   Constipation, Adult Constipation is when a person has fewer bowel movements in a week than normal, has difficulty having a bowel movement, or has stools that are dry, hard, or larger than normal. Constipation may be caused by an underlying condition. It may become worse with age if a person takes certain medicines and does not take in enough fluids. Follow these instructions at home: Eating and drinking   Eat foods that have a lot of fiber, such as fresh fruits and vegetables, whole grains, and beans.  Limit foods that are high in fat, low in fiber, or overly processed, such as french fries, hamburgers, cookies, candies, and soda.  Drink enough fluid to keep your urine clear or pale yellow. General instructions  Exercise regularly or as told by your health care provider.  Go to the restroom when you have the urge to go. Do not hold it in.  Take over-the-counter and prescription medicines only as told by your health care provider. These include any fiber supplements.  Practice pelvic floor retraining exercises, such as deep breathing while relaxing the lower abdomen and pelvic floor relaxation during bowel movements.  Watch your condition for any changes.  Keep all follow-up visits as told by your health care provider. This is important. Contact a health care provider if:  You have pain that gets worse.  You have a fever.  You do not have a bowel movement after 4 days.  You vomit.  You are not hungry.  You lose  weight.  You are bleeding from the anus.  You  have thin, pencil-like stools. Get help right away if:  You have a fever and your symptoms suddenly get worse.  You leak stool or have blood in your stool.  Your abdomen is bloated.  You have severe pain in your abdomen.  You feel dizzy or you faint. This information is not intended to replace advice given to you by your health care provider. Make sure you discuss any questions you have with your health care provider. Document Released: 05/06/2004 Document Revised: 02/26/2016 Document Reviewed: 01/27/2016 Elsevier Interactive Patient Education  [employer].   If you have lab work done today you will be contacted with your lab results within the next 2 weeks.  If you have not heard from [name] then please contact us. The fastest way to get your results is to register for My Chart.   IF you received an x-ray today, you will receive an invoice from [facility]. Please contact [facility] at [phone] with questions or concerns regarding your invoice.   IF you received labwork today, you will receive an invoice from [facility]. Please contact [facility] at [phone] with questions or concerns regarding your invoice.   Our billing staff will not be able to assist you with questions regarding bills from these companies.  You will be contacted with the lab results as soon as they are available. The fastest way to get your results is to activate your My Chart account. Instructions are located on the last page of this paperwork. If you have not heard from [name] regarding the results in 2 weeks, please contact this office.       I personally performed the services described in this documentation, which was scribed in my presence. The recorded information has been reviewed and considered for accuracy and completeness, addended by me as needed, and agree with information above.  Signed,   Merri Ray, MD [facility].   06/05/18 9:04 AM

## 2018-06-07 ENCOUNTER — Encounter: Payer: Self-pay | Admitting: Sports Medicine

## 2018-07-09 DIAGNOSIS — K5909 Other constipation: Secondary | ICD-10-CM | POA: Diagnosis not present

## 2018-07-09 DIAGNOSIS — Z800 Family history of malignant neoplasm of digestive organs: Secondary | ICD-10-CM | POA: Diagnosis not present

## 2018-08-03 DIAGNOSIS — H02831 Dermatochalasis of right upper eyelid: Secondary | ICD-10-CM | POA: Diagnosis not present

## 2018-08-03 DIAGNOSIS — H35371 Puckering of macula, right eye: Secondary | ICD-10-CM | POA: Diagnosis not present

## 2018-08-03 DIAGNOSIS — H2513 Age-related nuclear cataract, bilateral: Secondary | ICD-10-CM | POA: Diagnosis not present

## 2018-08-03 DIAGNOSIS — H04123 Dry eye syndrome of bilateral lacrimal glands: Secondary | ICD-10-CM | POA: Diagnosis not present

## 2018-08-28 DIAGNOSIS — L821 Other seborrheic keratosis: Secondary | ICD-10-CM | POA: Diagnosis not present

## 2018-08-28 DIAGNOSIS — L820 Inflamed seborrheic keratosis: Secondary | ICD-10-CM | POA: Diagnosis not present

## 2018-08-28 DIAGNOSIS — D1801 Hemangioma of skin and subcutaneous tissue: Secondary | ICD-10-CM | POA: Diagnosis not present

## 2018-09-06 DIAGNOSIS — Z800 Family history of malignant neoplasm of digestive organs: Secondary | ICD-10-CM | POA: Diagnosis not present

## 2018-09-11 ENCOUNTER — Encounter: Payer: Self-pay | Admitting: Family Medicine

## 2018-09-11 ENCOUNTER — Other Ambulatory Visit: Payer: Self-pay | Admitting: Family Medicine

## 2018-09-11 DIAGNOSIS — G4700 Insomnia, unspecified: Secondary | ICD-10-CM

## 2018-09-11 MED ORDER — SUVOREXANT 10 MG PO TABS: 10.0000 mg | ORAL_TABLET | Freq: Every evening | ORAL | 2 refills | Status: DC | PRN

## 2018-09-11 NOTE — Progress Notes (Signed)
See my chart message

## 2018-09-12 MED ORDER — DICLOFENAC SODIUM 75 MG PO TBEC: DELAYED_RELEASE_TABLET | ORAL | 0 refills | Status: DC

## 2018-09-26 DIAGNOSIS — M9902 Segmental and somatic dysfunction of thoracic region: Secondary | ICD-10-CM | POA: Diagnosis not present

## 2018-09-26 DIAGNOSIS — M9905 Segmental and somatic dysfunction of pelvic region: Secondary | ICD-10-CM | POA: Diagnosis not present

## 2018-09-26 DIAGNOSIS — M9903 Segmental and somatic dysfunction of lumbar region: Secondary | ICD-10-CM | POA: Diagnosis not present

## 2018-09-26 DIAGNOSIS — M9906 Segmental and somatic dysfunction of lower extremity: Secondary | ICD-10-CM | POA: Diagnosis not present

## 2018-10-01 DIAGNOSIS — M9902 Segmental and somatic dysfunction of thoracic region: Secondary | ICD-10-CM | POA: Diagnosis not present

## 2018-10-01 DIAGNOSIS — M9903 Segmental and somatic dysfunction of lumbar region: Secondary | ICD-10-CM | POA: Diagnosis not present

## 2018-10-01 DIAGNOSIS — M9905 Segmental and somatic dysfunction of pelvic region: Secondary | ICD-10-CM | POA: Diagnosis not present

## 2018-10-01 DIAGNOSIS — Z01419 Encounter for gynecological examination (general) (routine) without abnormal findings: Secondary | ICD-10-CM | POA: Diagnosis not present

## 2018-10-01 DIAGNOSIS — M9906 Segmental and somatic dysfunction of lower extremity: Secondary | ICD-10-CM | POA: Diagnosis not present

## 2018-10-16 DIAGNOSIS — M25562 Pain in left knee: Secondary | ICD-10-CM | POA: Diagnosis not present

## 2018-10-16 DIAGNOSIS — G8929 Other chronic pain: Secondary | ICD-10-CM | POA: Diagnosis not present

## 2018-10-24 DIAGNOSIS — H353131 Nonexudative age-related macular degeneration, bilateral, early dry stage: Secondary | ICD-10-CM | POA: Diagnosis not present

## 2018-10-24 DIAGNOSIS — H35371 Puckering of macula, right eye: Secondary | ICD-10-CM | POA: Diagnosis not present

## 2018-10-24 DIAGNOSIS — H04123 Dry eye syndrome of bilateral lacrimal glands: Secondary | ICD-10-CM | POA: Diagnosis not present

## 2018-10-24 DIAGNOSIS — H2513 Age-related nuclear cataract, bilateral: Secondary | ICD-10-CM | POA: Diagnosis not present

## 2018-10-26 ENCOUNTER — Other Ambulatory Visit: Payer: Self-pay | Admitting: Family Medicine

## 2018-10-26 ENCOUNTER — Encounter: Payer: Self-pay | Admitting: Family Medicine

## 2018-10-26 DIAGNOSIS — E785 Hyperlipidemia, unspecified: Secondary | ICD-10-CM

## 2018-10-26 NOTE — Telephone Encounter (Signed)
Requested medication (s) are due for refill today: yes  Requested medication (s) are on the active medication list: yes  Last refill:  06/05/18 #90 2 RF  Future visit scheduled: no  Notes to clinic:  Prescription has expired11/14/19  Pt left message today If approved  This is the pt message sent today 10/26/18 Dr, Carlota Raspberry, We did a Lipid Panel in Oct 2019. I have used all the Atorvastatin and do not have any refills. Per my history, we would not do another Lipid Panel until about May 2020.    Would you/Can you refill my Atorvastatin for at least these next two months. The prescription historically comes from [facility] and is a mail order. To just get one order of 45 tablets - perhaps [facility] could sent to [facility] on [address]. In May, after the annual Lipid Panel, we will go back to a mail order format. Can you do this for me? Thank you for your assistance with this request. I am doing well.  Erika Johns    Requested Prescriptions  Pending Prescriptions Disp Refills   atorvastatin (LIPITOR) 40 MG tablet [Pharmacy Med Name: ATORVASTATIN 40 MG TABLET] 45 tablet 1    Sig: Take 1 tablet by mouth every other day     Cardiovascular:  Antilipid - Statins Failed - 10/26/2018 12:36 PM      Failed - LDL in normal range and within 360 days    LDL Calculated  Date Value Ref Range Status  06/05/2018 109 (H) 0 - 99 mg/dL Final         Passed - Total Cholesterol in normal range and within 360 days    Cholesterol, Total  Date Value Ref Range Status  06/05/2018 186 100 - 199 mg/dL Final         Passed - HDL in normal range and within 360 days    HDL  Date Value Ref Range Status  06/05/2018 64 >39 mg/dL Final         Passed - Triglycerides in normal range and within 360 days    Triglycerides  Date Value Ref Range Status  06/05/2018 67 0 - 149 mg/dL Final         Passed - Patient is not pregnant      Passed - Valid encounter within last 12 months    Recent  Outpatient Visits          4 months ago Hyperlipidemia, unspecified hyperlipidemia type   [facility], Ranell Patrick, MD   7 months ago Depression with anxiety   [facility], Ranell Patrick, MD   1 year ago Depression with anxiety   [facility], Ranell Patrick, MD   1 year ago Insomnia, unspecified type   [facility], Ranell Patrick, MD   1 year ago Depression with anxiety   [facility], Ranell Patrick, MD

## 2018-11-26 ENCOUNTER — Ambulatory Visit (INDEPENDENT_AMBULATORY_CARE_PROVIDER_SITE_OTHER): Payer: PPO | Admitting: Family Medicine

## 2018-11-26 ENCOUNTER — Other Ambulatory Visit: Payer: Self-pay

## 2018-11-26 VITALS — Ht 70.25 in | Wt 146.0 lb

## 2018-11-26 DIAGNOSIS — Z0000 Encounter for general adult medical examination without abnormal findings: Secondary | ICD-10-CM | POA: Diagnosis not present

## 2018-11-26 NOTE — Progress Notes (Signed)
Presents today for Medicare Annual Wellness Visit    Patient Care Team: [name], MD as PCP - General (Family Medicine) [name], MD as Consulting Physician (Orthopedic Surgery) [facility] [place] (Orthopedic Surgery)    Immunization status:  Immunization History  Administered Date(s) Administered  . Influenza Split 04/28/2012, 05/22/2013, 06/05/2014  . Influenza, High Dose Seasonal PF 06/13/2017, 05/24/2018  . Influenza,inj,Quad PF,6+ Mos 05/12/2016  . Influenza-Unspecified 05/25/2015, 06/13/2017  . Pneumococcal Conjugate-13 01/12/2015, 05/31/2015  . Pneumococcal Polysaccharide-23 09/27/2016  . Td 11/22/2007  . Tdap 03/22/2018  . Zoster 06/22/2010     There are no preventive care reminders to display for this patient.   Functional Status Survey: Is the patient deaf or have difficulty hearing?: Yes Does the patient have difficulty seeing, even when wearing glasses/contacts?: No Does the patient have difficulty concentrating, remembering, or making decisions?: No Does the patient have difficulty walking or climbing stairs?: No Does the patient have difficulty dressing or bathing?: No Does the patient have difficulty doing errands alone such as visiting a doctor's office or shopping?: No   6CIT Screen 11/26/2018 04/27/2017  What Year? 0 points 0 points  What month? 0 points 0 points  What time? 0 points 0 points  Count back from 20 0 points 0 points  Months in reverse 0 points 0 points  Repeat phrase 0 points 0 points  Total Score 0 0        Clinical Support from 11/26/2018 in [facility]  AUDIT-C Score  4         Patient Active Problem List   Diagnosis Date Noted  . Pain in joint, ankle and foot 12/25/2017  . 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/03/2017  . Achilles tendon contracture, right 05/03/2017  . Osteoarthritis of left knee 08/27/2009  . LUMBAGO 08/27/2009  . 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 HAND  PAIN, LEFT 08/27/2009     Past Medical History:  Diagnosis Date  . Allergy   . Anxiety   . Arthritis   . Constipation, chronic   . Depression   . IBS (irritable bowel syndrome)   . Insomnia   . PONV (postoperative nausea and vomiting)    severe n/v-needs scop     Past Surgical History:  Procedure Laterality Date  . COLONOSCOPY    . DILATION AND CURETTAGE OF UTERUS  2006  . FRACTURE SURGERY  1996   fx tib-fib-rt  . HARDWARE REMOVAL  1996   rt ankle  . KNEE ARTHROSCOPY  2003   lt  . KNEE ARTHROSCOPY     right  . ORIF CLAVICULAR FRACTURE Right 05/28/2013   Procedure: OPEN REDUCTION INTERNAL FIXATION (ORIF) CLAVICULAR FRACTURE;  Surgeon: Renette Butters, MD;  Location: [facility];  Service: Orthopedics;  Laterality: Right;  . TONSILLECTOMY       History reviewed. No pertinent family history.   Social History   Socioeconomic History  . Marital status: Widowed    Spouse name: Not on file  . Number of children: Not on file  . Years of education: Not on file  . Highest education level: Not on file  Occupational History  . Not on file  Social Needs  . Financial resource strain: Not on file  . Food insecurity:    Worry: Not on file    Inability: Not on file  . Transportation needs:    Medical: Not on file    Non-medical: Not on file  Tobacco Use  . Smoking status: Never Smoker  . Smokeless tobacco: Never Used  Substance and Sexual Activity  . Alcohol use: Yes    Alcohol/week: 1.0 standard drinks    Types: 1 Glasses of wine per week  . Drug use: No  . Sexual activity: Yes  Lifestyle  . Physical activity:    Days per week: Not on file    Minutes per session: Not on file  . Stress: Not on file  Relationships  . Social connections:    Talks on phone: Not on file    Gets together: Not on file    Attends religious service: Not on file    Active member of club or organization: Not on file    Attends meetings of clubs or organizations: Not on  file    Relationship status: Not on file  . Intimate partner violence:    Fear of current or ex partner: Not on file    Emotionally abused: Not on file    Physically abused: Not on file    Forced sexual activity: Not on file  Other Topics Concern  . Not on file  Social History Narrative  . Not on file     Allergies  Allergen Reactions  . Effexor [Venlafaxine Hydrochloride] Other (See Comments)    dizzy  . Hydrocodone Nausea And Vomiting  . Ampicillin Rash     Prior to Admission medications   Medication Sig Start Date End Date Taking? Authorizing Provider  Ascorbic Acid (VITAMIN C) 1000 MG tablet Take 1,000 mg by mouth daily.   Yes [provider]  atorvastatin (LIPITOR) 40 MG tablet Take 1 tablet by mouth every other day 10/26/18  Yes [name], MD  Cholecalciferol (VITAMIN D3) 1000 UNITS CAPS Take by mouth daily.   Yes [provider]  diclofenac sodium (VOLTAREN) 1 % GEL Apply 2 g topically 4 (four) times daily as needed (left knee pain). 12/25/17  Yes Everrett Coombe, MD  OVER THE COUNTER MEDICATION    Yes [provider]  polyethylene glycol (MIRALAX / GLYCOLAX) packet Take 17 g by mouth daily.   Yes [provider]  sertraline (ZOLOFT) 50 MG tablet Take 1 tablet (50 mg total) by mouth daily. Patient taking differently: Take 25 mg by mouth daily.  10/12/17  Yes [name], MD  Suvorexant (BELSOMRA) 10 MG TABS Take 10-20 mg by mouth at bedtime as needed. 09/11/18  Yes [name], MD  tamsulosin (FLOMAX) 0.4 MG CAPS capsule Take 0.4 mg by mouth.   Yes [provider]  UNABLE TO FIND Med Name: [facility]   Yes [provider]  UNABLE TO FIND Med Name: Rocco Pauls [provider]  clonazePAM (KLONOPIN) 0.5 MG tablet Take 0.5-1 tablets (0.25-0.5 mg total) by mouth 2 (two) times daily as needed for anxiety. Patient not taking: Reported on 11/26/2018 10/12/17   [name], MD  diclofenac (VOLTAREN) 75  MG EC tablet Take one QD as needed for pain Patient not taking: Reported on 11/26/2018 09/12/18   Stefanie Libel, MD  DOCOSAHEXAENOIC ACID PO Take by mouth.    [provider]  glucosamine-chondroitin 500-400 MG tablet Take 1 tablet by mouth. 2 gel caps at breakfast    [provider]  halobetasol (ULTRAVATE) 0.05 % cream Apply topically 2 (two) times daily. Patient not taking: Reported on 11/26/2018 06/05/18   [name], MD     Depression screen [facility] 2/9 11/26/2018 06/05/2018 03/22/2018  10/12/2017 07/24/2017  Decreased Interest 0 0 0 0 0  Down, Depressed, Hopeless 0 0 0 0 0  PHQ - 2 Score 0 0 0 0 0  Altered sleeping - - - - -  Tired, decreased energy - - - - -  Change in appetite - - - - -  Feeling bad or failure about yourself  - - - - -  Trouble concentrating - - - - -  Moving slowly or fidgety/restless - - - - -  Suicidal thoughts - - - - -  PHQ-9 Score - - - - -  Difficult doing work/chores - - - - -     Fall Risk  11/26/2018 06/05/2018 03/22/2018 10/12/2017 07/24/2017  Falls in the past year? 0 No No No No  Number falls in past yr: 0 - - - -  Injury with Fall? 0 - - - -      PHYSICAL EXAM: Ht 5' 10.25" (1.784 m)   Wt 146 lb (66.2 kg)   BMI 20.80 kg/m    Wt Readings from Last 3 Encounters:  11/26/18 146 lb (66.2 kg)  06/05/18 147 lb (66.7 kg)  03/22/18 146 lb (66.2 kg)     No exam data present    Physical Exam   Education/Counseling provided regarding diet and exercise, prevention of chronic diseases, smoking/tobacco cessation, if applicable, and reviewed "Covered Medicare Preventive Services."   ASSESSMENT/PLAN: 1. Medicare annual wellness visit, subsequent  Other orders - [place]; Med Name: TUMERIC - polyethylene glycol (MIRALAX / GLYCOLAX) packet; Take 17 g by mouth daily. - UNABLE TO FIND; Med Name: Constance Haw      Review of Systems:  N/A Cardiac Risk Factors include: advanced age (>34men, >29  women);dyslipidemia  Screening recommendations/referrals: Colonoscopy: up to date, next due 12/21/2021 Mammogram: up to date, you stated you had this done 01/03/17 Bone Density: up to date, next due 07/07/2020 Recommended yearly ophthalmology/optometry visit for glaucoma screening and checkup Recommended yearly dental visit for hygiene and checkup  Advanced directives: Please bring a copy of your POA (Power of [place]) and/or Living Will to your next appointment.     Patient states she started cycling again. She walks 3 times a week also.  Lives in one story home No trouble climbing stairs Grab bars in bathroom  No scattered rugs Well lit.  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  States she is not a canadate for surgery yet.   Goes to [employer]

## 2018-11-26 NOTE — Patient Instructions (Signed)
Thank you for taking time to come for your Medicare Wellness Visit. I appreciate your ongoing commitment to your health goals. Please review the following plan we discussed and let me know if I can assist you in the future.  Wendee Hata LPN  Healthy Eating Following a healthy eating pattern may help you to achieve and maintain a healthy body weight, reduce the risk of chronic disease, and live a long and productive life. It is important to follow a healthy eating pattern at an appropriate calorie level for your body. Your nutritional needs should be met primarily through food by choosing a variety of nutrient-rich foods. What are tips for following this plan? Reading food labels  Read labels and choose the following: ? Reduced or low sodium. ? Juices with 100% fruit juice. ? Foods with low saturated fats and high polyunsaturated and monounsaturated fats. ? Foods with whole grains, such as whole wheat, cracked wheat, brown rice, and wild rice. ? Whole grains that are fortified with folic acid. This is recommended for women who are pregnant or who want to become pregnant.  Read labels and avoid the following: ? Foods with a lot of added sugars. These include foods that contain brown sugar, corn sweetener, corn syrup, dextrose, fructose, glucose, high-fructose corn syrup, honey, invert sugar, lactose, malt syrup, maltose, molasses, raw sugar, sucrose, trehalose, or turbinado sugar.  Do not eat more than the following amounts of added sugar per day:  6 teaspoons (25 g) for women.  9 teaspoons (38 g) for men. ? Foods that contain processed or refined starches and grains. ? Refined grain products, such as white flour, degermed cornmeal, white bread, and white rice. Shopping  Choose nutrient-rich snacks, such as vegetables, whole fruits, and nuts. Avoid high-calorie and high-sugar snacks, such as potato chips, fruit snacks, and candy.  Use oil-based dressings and spreads on foods instead of  solid fats such as butter, stick margarine, or cream cheese.  Limit pre-made sauces, mixes, and "instant" products such as flavored rice, instant noodles, and ready-made pasta.  Try more plant-protein sources, such as tofu, tempeh, black beans, edamame, lentils, nuts, and seeds.  Explore eating plans such as the Mediterranean diet or vegetarian diet. Cooking  Use oil to saut or stir-fry foods instead of solid fats such as butter, stick margarine, or lard.  Try baking, boiling, grilling, or broiling instead of frying.  Remove the fatty part of meats before cooking.  Steam vegetables in water or broth. Meal planning   At meals, imagine dividing your plate into fourths: ? One-half of your plate is fruits and vegetables. ? One-fourth of your plate is whole grains. ? One-fourth of your plate is protein, especially lean meats, poultry, eggs, tofu, beans, or nuts.  Include low-fat dairy as part of your daily diet. Lifestyle  Choose healthy options in all settings, including home, work, school, restaurants, or stores.  Prepare your food safely: ? Wash your hands after handling raw meats. ? Keep food preparation surfaces clean by regularly washing with hot, soapy water. ? Keep raw meats separate from ready-to-eat foods, such as fruits and vegetables. ? Cook seafood, meat, poultry, and eggs to the recommended internal temperature. ? Store foods at safe temperatures. In general:  Keep cold foods at 40F (4.4C) or below.  Keep hot foods at 140F (60C) or above.  Keep your freezer at 0F (-17.8C) or below.  Foods are no longer safe to eat when they have been between the temperatures of 40-140F (4.4-60C) for   more than 2 hours. What foods should I eat? Fruits Aim to eat 2 cup-equivalents of fresh, canned (in natural juice), or frozen fruits each day. Examples of 1 cup-equivalent of fruit include 1 small apple, 8 large strawberries, 1 cup canned fruit,  cup dried fruit, or 1 cup  100% juice. Vegetables Aim to eat 2-3 cup-equivalents of fresh and frozen vegetables each day, including different varieties and colors. Examples of 1 cup-equivalent of vegetables include 2 medium carrots, 2 cups raw, leafy greens, 1 cup chopped vegetable (raw or cooked), or 1 medium baked potato. Grains Aim to eat 6 ounce-equivalents of whole grains each day. Examples of 1 ounce-equivalent of grains include 1 slice of bread, 1 cup ready-to-eat cereal, 3 cups popcorn, or  cup cooked rice, pasta, or cereal. Meats and other proteins Aim to eat 5-6 ounce-equivalents of protein each day. Examples of 1 ounce-equivalent of protein include 1 egg, 1/2 cup nuts or seeds, or 1 tablespoon (16 g) peanut butter. A cut of meat or fish that is the size of a deck of cards is about 3-4 ounce-equivalents.  Of the protein you eat each week, try to have at least 8 ounces come from seafood. This includes salmon, trout, herring, and anchovies. Dairy Aim to eat 3 cup-equivalents of fat-free or low-fat dairy each day. Examples of 1 cup-equivalent of dairy include 1 cup (240 mL) milk, 8 ounces (250 g) yogurt, 1 ounces (44 g) natural cheese, or 1 cup (240 mL) fortified soy milk. Fats and oils  Aim for about 5 teaspoons (21 g) per day. Choose monounsaturated fats, such as canola and olive oils, avocados, peanut butter, and most nuts, or polyunsaturated fats, such as sunflower, corn, and soybean oils, walnuts, pine nuts, sesame seeds, sunflower seeds, and flaxseed. Beverages  Aim for six 8-oz glasses of water per day. Limit coffee to three to five 8-oz cups per day.  Limit caffeinated beverages that have added calories, such as soda and energy drinks.  Limit alcohol intake to no more than 1 drink a day for nonpregnant women and 2 drinks a day for men. One drink equals 12 oz of beer (355 mL), 5 oz of wine (148 mL), or 1 oz of hard liquor (44 mL). Seasoning and other foods  Avoid adding excess amounts of salt to your  foods. Try flavoring foods with herbs and spices instead of salt.  Avoid adding sugar to foods.  Try using oil-based dressings, sauces, and spreads instead of solid fats. This information is based on general [place] nutrition guidelines. For more information, visit choosemyplate.gov. Exact amounts may vary based on your nutrition needs. Summary  A healthy eating plan may help you to maintain a healthy weight, reduce the risk of chronic diseases, and stay active throughout your life.  Plan your meals. Make sure you eat the right portions of a variety of nutrient-rich foods.  Try baking, boiling, grilling, or broiling instead of frying.  Choose healthy options in all settings, including home, work, school, restaurants, or stores. This information is not intended to replace advice given to you by your health care provider. Make sure you discuss any questions you have with your health care provider. Document Released: 11/20/2017 Document Revised: 11/20/2017 Document Reviewed: 11/20/2017 Elsevier Interactive Patient Education  2019 Elsevier Inc.  

## 2018-11-28 DIAGNOSIS — K5909 Other constipation: Secondary | ICD-10-CM | POA: Diagnosis not present

## 2018-12-17 DIAGNOSIS — K5909 Other constipation: Secondary | ICD-10-CM | POA: Diagnosis not present

## 2019-01-17 ENCOUNTER — Encounter: Payer: Self-pay | Admitting: Podiatry

## 2019-01-17 ENCOUNTER — Ambulatory Visit: Payer: PPO | Admitting: Podiatry

## 2019-01-17 ENCOUNTER — Other Ambulatory Visit: Payer: Self-pay

## 2019-01-17 VITALS — Temp 97.5°F

## 2019-01-17 DIAGNOSIS — M779 Enthesopathy, unspecified: Secondary | ICD-10-CM | POA: Diagnosis not present

## 2019-01-17 DIAGNOSIS — M778 Other enthesopathies, not elsewhere classified: Secondary | ICD-10-CM

## 2019-01-17 NOTE — Progress Notes (Signed)
She presents today for follow-up of capsulitis of the right second metatarsal phalangeal joint states that she noticed the toes are starting to spread out and may get wider.  Objective: Vital signs are stable alert and oriented x3.  Pulses are palpable.  Neurologic sensorium is intact.  dT reflexes are intact.  She has some swelling about the first second interdigital spaces with mild medial deviation of the second toe and tenderness on palpation of the second interdigital space.  Assessment: Capsulitis hammertoe deformity second metatarsal phalangeal joint right.  Plan: Discussed etiology pathology conservative or surgical therapies at this point I recommended surgical therapy consisting of second tarsal osteotomy hammertoe repair #2 #3 #4 the right foot.  She understands this is amenable to it we will follow-up with me in the fall for surgery.

## 2019-01-22 DIAGNOSIS — H04123 Dry eye syndrome of bilateral lacrimal glands: Secondary | ICD-10-CM | POA: Diagnosis not present

## 2019-01-22 DIAGNOSIS — H524 Presbyopia: Secondary | ICD-10-CM | POA: Diagnosis not present

## 2019-01-22 DIAGNOSIS — H5203 Hypermetropia, bilateral: Secondary | ICD-10-CM | POA: Diagnosis not present

## 2019-01-22 DIAGNOSIS — H35371 Puckering of macula, right eye: Secondary | ICD-10-CM | POA: Diagnosis not present

## 2019-01-22 DIAGNOSIS — H2513 Age-related nuclear cataract, bilateral: Secondary | ICD-10-CM | POA: Diagnosis not present

## 2019-01-22 DIAGNOSIS — H353131 Nonexudative age-related macular degeneration, bilateral, early dry stage: Secondary | ICD-10-CM | POA: Diagnosis not present

## 2019-01-22 DIAGNOSIS — H52223 Regular astigmatism, bilateral: Secondary | ICD-10-CM | POA: Diagnosis not present

## 2019-01-30 ENCOUNTER — Encounter: Payer: Self-pay | Admitting: Family Medicine

## 2019-02-04 DIAGNOSIS — R05 Cough: Secondary | ICD-10-CM | POA: Diagnosis not present

## 2019-02-04 DIAGNOSIS — Z7289 Other problems related to lifestyle: Secondary | ICD-10-CM | POA: Diagnosis not present

## 2019-02-04 DIAGNOSIS — H6123 Impacted cerumen, bilateral: Secondary | ICD-10-CM | POA: Diagnosis not present

## 2019-02-06 ENCOUNTER — Other Ambulatory Visit: Payer: Self-pay

## 2019-02-06 ENCOUNTER — Ambulatory Visit (INDEPENDENT_AMBULATORY_CARE_PROVIDER_SITE_OTHER): Payer: PPO | Admitting: Family Medicine

## 2019-02-06 ENCOUNTER — Telehealth: Payer: Self-pay | Admitting: *Deleted

## 2019-02-06 ENCOUNTER — Telehealth: Payer: Self-pay | Admitting: Emergency Medicine

## 2019-02-06 ENCOUNTER — Encounter: Payer: Self-pay | Admitting: Family Medicine

## 2019-02-06 VITALS — BP 136/52 | HR 65 | Temp 99.0°F | Resp 12 | Wt 142.2 lb

## 2019-02-06 DIAGNOSIS — E785 Hyperlipidemia, unspecified: Secondary | ICD-10-CM | POA: Diagnosis not present

## 2019-02-06 DIAGNOSIS — R059 Cough, unspecified: Secondary | ICD-10-CM

## 2019-02-06 DIAGNOSIS — R058 Other specified cough: Secondary | ICD-10-CM

## 2019-02-06 DIAGNOSIS — F439 Reaction to severe stress, unspecified: Secondary | ICD-10-CM

## 2019-02-06 DIAGNOSIS — F418 Other specified anxiety disorders: Secondary | ICD-10-CM | POA: Diagnosis not present

## 2019-02-06 DIAGNOSIS — R05 Cough: Secondary | ICD-10-CM | POA: Diagnosis not present

## 2019-02-06 DIAGNOSIS — G4700 Insomnia, unspecified: Secondary | ICD-10-CM | POA: Diagnosis not present

## 2019-02-06 DIAGNOSIS — Z20822 Contact with and (suspected) exposure to covid-19: Secondary | ICD-10-CM

## 2019-02-06 MED ORDER — FLUTICASONE PROPIONATE 50 MCG/ACT NA SUSP: 2.0000 | Freq: Every day | NASAL | 6 refills | Status: DC

## 2019-02-06 MED ORDER — ATORVASTATIN CALCIUM 40 MG PO TABS: 40.0000 mg | ORAL_TABLET | ORAL | 1 refills | Status: DC

## 2019-02-06 MED ORDER — ALBUTEROL SULFATE HFA 108 (90 BASE) MCG/ACT IN AERS: 1.0000 | INHALATION_SPRAY | RESPIRATORY_TRACT | 0 refills | Status: DC | PRN

## 2019-02-06 MED ORDER — CLONAZEPAM 0.5 MG PO TABS: 0.2500 mg | ORAL_TABLET | Freq: Two times a day (BID) | ORAL | 1 refills | Status: DC | PRN

## 2019-02-06 MED ORDER — BELSOMRA 10 MG PO TABS: 10.0000 mg | ORAL_TABLET | Freq: Every evening | ORAL | 3 refills | Status: DC | PRN

## 2019-02-06 MED ORDER — SERTRALINE HCL 50 MG PO TABS: 25.0000 mg | ORAL_TABLET | Freq: Every day | ORAL | 1 refills | Status: DC

## 2019-02-06 NOTE — Progress Notes (Signed)
Subjective:    Patient ID: Erika Johns, female    DOB: March 12, 1951, 68 y.o.   MRN: [id number]  HPI Erika Johns is a 68 y.o. female Presents today for: Chief Complaint  Patient presents with  . Asthma    6 month f/u for asthma- 2-3 weeks ago develope cough but only when talk, no loss in taste nor smell. Have not been exposed to any one with the covid but will like to get tested for covid. Patient has a question about statin med? Patient want to know if she need to be taking ubiquinol with statin med   Cough//asthma? Noticed cough few weeks ago. Noticed with talking at times, no dyspnea. No fever. Sx's 2-3 weeks.  Nonproductive.  Able to cycle without symptoms usually.  No change in taste or smell. No n/v diarrhea.  Slight HA 2 days ago, not since. Tylenol resolved HA.  Cold bike ride in February., wheezing at that time. No inhaler - improved with rest.  No known exposure to Covid 19.  Denies heartburn, but some PND, Min nasal congestion.  Tx: saline NS, no recent steroid NS. Has used flonase in past that helped allergies. No other otc treatments. Will be driving to [place] in 6 days. Bought a Printmaker.    Depression with anxiety.  Still some stress with 37 year old mom.  Has met with therapist.- Vivia Budge.  Feels like continuing  18m Zoloft daily (1/2 of 549m.  Rare situational anxiety, last refilled klonopin aver a year ago - has a few left.  Taking belsomra 2 nights per week - splitting in half.  Controlled substance database (PDMP) reviewed. No concerns appreciated. Last klonopin Rx filled 10/13/17. belsomra filled 12/05/18  Hyperlipidemia:  Lab Results  Component Value Date   CHOL 186 06/05/2018   HDL 64 06/05/2018   LDLCALC 109 (H) 06/05/2018   TRIG 67 06/05/2018   CHOLHDL 2.9 06/05/2018   Lab Results  Component Value Date   ALT 20 06/05/2018   AST 24 06/05/2018   ALKPHOS 63 06/05/2018   BILITOT 0.5 06/05/2018  plans on return for fasting  labs.  lipitor 4075mvery other day. Considering CoQ 10 supplement.       Patient Active Problem List   Diagnosis Date Noted  . Pain in joint, ankle and foot 12/25/2017  . 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/03/2017  . Achilles tendon contracture, right 05/03/2017  . Osteoarthritis of left knee 08/27/2009  . LUMBAGO 08/27/2009  . 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 HAND PAIN, LEFT 08/27/2009   Past Medical History:  Diagnosis Date  . Allergy   . Anxiety   . Arthritis   . Constipation, chronic   . Depression   . IBS (irritable bowel syndrome)   . Insomnia   . PONV (postoperative nausea and vomiting)    severe n/v-needs scop   Past Surgical History:  Procedure Laterality Date  . COLONOSCOPY    . DILATION AND CURETTAGE OF UTERUS  2006  . FRACTURE SURGERY  1996   fx [id number]  . HARDWARE REMOVAL  1996   rt ankle  . KNEE ARTHROSCOPY  2003   lt  . KNEE ARTHROSCOPY     right  . ORIF CLAVICULAR FRACTURE Right 05/28/2013   Procedure: OPEN REDUCTION INTERNAL FIXATION (ORIF) CLAVICULAR FRACTURE;  Surgeon: TimRenette ButtersD;  [facility]  Laterality: Right;  . TONSILLECTOMY     Allergies  Allergen Reactions  .  Effexor [Venlafaxine Hydrochloride] Other (See Comments)    dizzy  . Hydrocodone Nausea And Vomiting  . Ampicillin Rash   Prior to Admission medications   Medication Sig Start Date End Date Taking? Authorizing Provider  Ascorbic Acid (VITAMIN C) 1000 MG tablet Take 1,000 mg by mouth daily.    [provider]  atorvastatin (LIPITOR) 40 MG tablet Take 1 tablet by mouth every other day 10/26/18   [name], MD  Cholecalciferol (VITAMIN D3) 1000 UNITS CAPS Take by mouth daily.    [provider]  clonazePAM (KLONOPIN) 0.5 MG tablet Take 0.5-1 tablets (0.25-0.5 mg total) by mouth 2 (two) times daily as needed for anxiety. Patient not taking: Reported on 11/26/2018 10/12/17   [name], MD   diclofenac (VOLTAREN) 75 MG EC tablet Take one QD as needed for pain Patient not taking: Reported on 11/26/2018 09/12/18   Stefanie Libel, MD  diclofenac sodium (VOLTAREN) 1 % GEL Apply 2 g topically 4 (four) times daily as needed (left knee pain). 12/25/17   Everrett Coombe, MD  DOCOSAHEXAENOIC ACID PO Take by mouth.    [provider]  glucosamine-chondroitin 500-400 MG tablet Take 1 tablet by mouth. 2 gel caps at breakfast    [provider]  halobetasol (ULTRAVATE) 0.05 % cream Apply topically 2 (two) times daily. Patient not taking: Reported on 11/26/2018 06/05/18   [name], MD  OVER THE COUNTER MEDICATION     [provider]  polyethylene glycol (MIRALAX / GLYCOLAX) packet Take 17 g by mouth daily.    [provider]  sertraline (ZOLOFT) 50 MG tablet Take 1 tablet (50 mg total) by mouth daily. Patient taking differently: Take 25 mg by mouth daily.  10/12/17   [name], MD  Suvorexant (BELSOMRA) 10 MG TABS Take 10-20 mg by mouth at bedtime as needed. 09/11/18   [name], MD  tamsulosin (FLOMAX) 0.4 MG CAPS capsule Take 0.4 mg by mouth.    [provider]  UNABLE TO FIND Med Name: [facility]    [provider]  UNABLE TO FIND Med Name: [facility]    [provider]   Social History   Socioeconomic History  . Marital status: Widowed    Spouse name: Not on file  . Number of children: Not on file  . Years of education: Not on file  . Highest education level: Not on file  Occupational History  . Not on file  Social Needs  . Financial resource strain: Not on file  . Food insecurity    Worry: Not on file    Inability: Not on file  . Transportation needs    Medical: Not on file    Non-medical: Not on file  Tobacco Use  . Smoking status: Never Smoker  . Smokeless tobacco: Never Used  Substance and Sexual Activity  . Alcohol use: Yes    Alcohol/week: 1.0 standard drinks    Types: 1 Glasses of wine per week  .  Drug use: No  . Sexual activity: Yes  Lifestyle  . Physical activity    Days per week: Not on file    Minutes per session: Not on file  . Stress: Not on file  Relationships  . Social Herbalist on phone: Not on file    Gets together: Not on file    Attends religious service: Not on file    Active member of club or organization: Not on file  Attends meetings of clubs or organizations: Not on file    Relationship status: Not on file  . Intimate partner violence    Fear of current or ex partner: Not on file    Emotionally abused: Not on file    Physically abused: Not on file    Forced sexual activity: Not on file  Other Topics Concern  . Not on file  Social History Narrative  . Not on file    Review of Systems Per HPI    Objective:   Physical Exam Vitals signs reviewed.  Constitutional:      Appearance: She is well-developed.  HENT:     Head: Normocephalic and atraumatic.  Eyes:     Conjunctiva/sclera: Conjunctivae normal.     Pupils: Pupils are equal, round, and reactive to light.  Neck:     Vascular: No carotid bruit.  Cardiovascular:     Rate and Rhythm: Normal rate and regular rhythm.     Heart sounds: Normal heart sounds.  Pulmonary:     Effort: Pulmonary effort is normal. No respiratory distress.     Breath sounds: Normal breath sounds. No wheezing, rhonchi or rales.  Abdominal:     Palpations: Abdomen is soft. There is no pulsatile mass.     Tenderness: There is no abdominal tenderness.  Skin:    General: Skin is warm and dry.  Neurological:     Mental Status: She is alert and oriented to person, place, and time.  Psychiatric:        Behavior: Behavior normal.    Vitals:   02/06/19 1200  BP: (!) 136/52  Pulse: 65  Resp: 12  Temp: 99 F (37.2 C)  TempSrc: Oral  SpO2: 96%  Weight: 142 lb 3.2 oz (64.5 kg)      Assessment & Plan:    Erika Johns is a 68 y.o. female Hyperlipidemia, unspecified hyperlipidemia type -  Plan: Comprehensive metabolic panel, Lipid panel, atorvastatin (LIPITOR) 40 MG tablet  -Tolerating Lipitor every other day, labs pending.  No changes for now.  Okay to use over-the-counter co-Q10.  Depression with anxiety - Plan: clonazePAM (KLONOPIN) 0.5 MG tablet, sertraline (ZOLOFT) 50 MG tablet Situational stress - Plan: clonazePAM (KLONOPIN) 0.5 MG tablet, sertraline (ZOLOFT) 50 MG tablet  -Overall stable, including with current stressors.  Decided to remain on same dose of 25 mg Zoloft, Klonopin as needed, rare use.  Cough - Plan: fluticasone (FLONASE) 50 MCG/ACT nasal spray, albuterol (VENTOLIN HFA) 108 (90 Base) MCG/ACT inhaler  -Suspected upper airway cough syndrome, less likely reactive airway/asthma.    -Doubt coronavirus as no fever, or other typical symptoms except for cough but does report headache 2 days ago and planned road trip next week.  Will check COVID-19 testing.  Recommend isolation as much as possible until results known  -Trial of Flonase for postnasal drip upper airway irritation, Pepcid as needed for possible heartburn.  -Albuterol if needed for wheezing although clear on current exam.  RTC precautions if frequent/persistent use or worsening symptoms.  Insomnia, unspecified type - Plan: Suvorexant (BELSOMRA) 10 MG TABS  -Stable, continue same dosing.  Upper airway cough syndrome - Plan: fluticasone (FLONASE) 50 MCG/ACT nasal spray, albuterol (VENTOLIN HFA) 108 (90 Base) MCG/ACT inhaler  -As above  Meds ordered this encounter  Medications  . clonazePAM (KLONOPIN) 0.5 MG tablet    Sig: Take 0.5-1 tablets (0.25-0.5 mg total) by mouth 2 (two) times daily as needed for anxiety.    Dispense:  45 tablet  Refill:  1  . atorvastatin (LIPITOR) 40 MG tablet    Sig: Take 1 tablet (40 mg total) by mouth every other day.    Dispense:  45 tablet    Refill:  1    Refill [id number]  . sertraline (ZOLOFT) 50 MG tablet    Sig: Take 0.5 tablets (25 mg total) by mouth daily.     Dispense:  45 tablet    Refill:  1  . fluticasone (FLONASE) 50 MCG/ACT nasal spray    Sig: Place 2 sprays into both nostrils daily.    Dispense:  16 g    Refill:  6  . albuterol (VENTOLIN HFA) 108 (90 Base) MCG/ACT inhaler    Sig: Inhale 1-2 puffs into the lungs every 4 (four) hours as needed for wheezing or shortness of breath.    Dispense:  1 Inhaler    Refill:  0  . Suvorexant (BELSOMRA) 10 MG TABS    Sig: Take 10-20 mg by mouth at bedtime as needed.    Dispense:  10 tablet    Refill:  3   Patient Instructions    Cough is likely upper airway cough syndrome which can be due to allergies or heartburn.  Can try Pepcid over-the-counter, but would definitely restart Flonase nasal spray 1 2 sprays per nostril each day to see if that helps.  If you are wheezing, or short of breath, or persistent cough, can try the albuterol inhaler for possible asthma symptoms but lungs sounded clear today.  I will have the staff order the COVID testing for headache on Monday and persistent cough, but that is unlikely. Return to the clinic or go to the nearest emergency room if any of your symptoms worsen or new symptoms occur.  Continue statin, okay to use co-Q10 over-the-counter.  We will call you to schedule the lab visit within the next 1 week  No change in Zoloft dose for now, Klonopin refilled if needed, Belsomra still refilled for sleep.  Follow-up in 3 months and we can discuss his medications further, sooner if worse.  Thank you for coming in today.    Cough, Adult  Coughing is a reflex that clears your throat and your airways. Coughing helps to heal and protect your lungs. It is normal to cough occasionally, but a cough that happens with other symptoms or lasts a long time may be a sign of a condition that needs treatment. A cough may last only 2-3 weeks (acute), or it may last longer than 8 weeks (chronic). What are the causes? Coughing is commonly caused by:  Breathing in substances that  irritate your lungs.  A viral or bacterial respiratory infection.  Allergies.  Asthma.  Postnasal drip.  Smoking.  Acid backing up from the stomach into the esophagus (gastroesophageal reflux).  Certain medicines.  Chronic lung problems, including COPD (or rarely, lung cancer).  Other medical conditions such as heart failure. Follow these instructions at home: Pay attention to any changes in your symptoms. Take these actions to help with your discomfort:  Take medicines only as told by your health care provider. ? If you were prescribed an antibiotic medicine, take it as told by your health care provider. Do not stop taking the antibiotic even if you start to feel better. ? Talk with your health care provider before you take a cough suppressant medicine.  Drink enough fluid to keep your urine clear or pale yellow.  If the air is dry, use a cold steam vaporizer  or humidifier in your bedroom or your home to help loosen secretions.  Avoid anything that causes you to cough at work or at home.  If your cough is worse at night, try sleeping in a semi-upright position.  Avoid cigarette smoke. If you smoke, quit smoking. If you need help quitting, ask your health care provider.  Avoid caffeine.  Avoid alcohol.  Rest as needed. Contact a health care provider if:  You have new symptoms.  You cough up pus.  Your cough does not get better after 2-3 weeks, or your cough gets worse.  You cannot control your cough with suppressant medicines and you are losing sleep.  You develop pain that is getting worse or pain that is not controlled with pain medicines.  You have a fever.  You have unexplained weight loss.  You have night sweats. Get help right away if:  You cough up blood.  You have difficulty breathing.  Your heartbeat is very fast. This information is not intended to replace advice given to you by your health care provider. Make sure you discuss any questions  you have with your health care provider. Document Released: 02/04/2011 Document Revised: 01/14/2016 Document Reviewed: 10/15/2014 Elsevier Interactive Patient Education  [employer].    If you have lab work done today you will be contacted with your lab results within the next 2 weeks.  If you have not heard from [name] then please contact us. The fastest way to get your results is to register for My Chart.   IF you received an x-ray today, you will receive an invoice from [facility]. Please contact [facility] at [phone] with questions or concerns regarding your invoice.   IF you received labwork today, you will receive an invoice from [place]. Please contact [facility] at [phone] with questions or concerns regarding your invoice.   Our billing staff will not be able to assist you with questions regarding bills from these companies.  You will be contacted with the lab results as soon as they are available. The fastest way to get your results is to activate your My Chart account. Instructions are located on the last page of this paperwork. If you have not heard from [name] regarding the results in 2 weeks, please contact this office.       Signed,   Merri Ray, MD [facility].  02/07/19 12:20 PM

## 2019-02-06 NOTE — Telephone Encounter (Signed)
Patient scheduled for covid testing 02/07/19 @ [facility]. Instructions given and ordered placed

## 2019-02-06 NOTE — Telephone Encounter (Signed)
Please schedule drive up testing for Covid testing- DX: Headache and cough

## 2019-02-06 NOTE — Patient Instructions (Addendum)
Cough is likely upper airway cough syndrome which can be due to allergies or heartburn.  Can try Pepcid over-the-counter, but would definitely restart Flonase nasal spray 1 2 sprays per nostril each day to see if that helps.  If you are wheezing, or short of breath, or persistent cough, can try the albuterol inhaler for possible asthma symptoms but lungs sounded clear today.  I will have the staff order the COVID testing for headache on Monday and persistent cough, but that is unlikely. Return to the clinic or go to the nearest emergency room if any of your symptoms worsen or new symptoms occur.  Continue statin, okay to use co-Q10 over-the-counter.  We will call you to schedule the lab visit within the next 1 week  No change in Zoloft dose for now, Klonopin refilled if needed, Belsomra still refilled for sleep.  Follow-up in 3 months and we can discuss his medications further, sooner if worse.  Thank you for coming in today.    Cough, Adult  Coughing is a reflex that clears your throat and your airways. Coughing helps to heal and protect your lungs. It is normal to cough occasionally, but a cough that happens with other symptoms or lasts a long time may be a sign of a condition that needs treatment. A cough may last only 2-3 weeks (acute), or it may last longer than 8 weeks (chronic). What are the causes? Coughing is commonly caused by:  Breathing in substances that irritate your lungs.  A viral or bacterial respiratory infection.  Allergies.  Asthma.  Postnasal drip.  Smoking.  Acid backing up from the stomach into the esophagus (gastroesophageal reflux).  Certain medicines.  Chronic lung problems, including COPD (or rarely, lung cancer).  Other medical conditions such as heart failure. Follow these instructions at home: Pay attention to any changes in your symptoms. Take these actions to help with your discomfort:  Take medicines only as told by your health care  provider. ? If you were prescribed an antibiotic medicine, take it as told by your health care provider. Do not stop taking the antibiotic even if you start to feel better. ? Talk with your health care provider before you take a cough suppressant medicine.  Drink enough fluid to keep your urine clear or pale yellow.  If the air is dry, use a cold steam vaporizer or humidifier in your bedroom or your home to help loosen secretions.  Avoid anything that causes you to cough at work or at home.  If your cough is worse at night, try sleeping in a semi-upright position.  Avoid cigarette smoke. If you smoke, quit smoking. If you need help quitting, ask your health care provider.  Avoid caffeine.  Avoid alcohol.  Rest as needed. Contact a health care provider if:  You have new symptoms.  You cough up pus.  Your cough does not get better after 2-3 weeks, or your cough gets worse.  You cannot control your cough with suppressant medicines and you are losing sleep.  You develop pain that is getting worse or pain that is not controlled with pain medicines.  You have a fever.  You have unexplained weight loss.  You have night sweats. Get help right away if:  You cough up blood.  You have difficulty breathing.  Your heartbeat is very fast. This information is not intended to replace advice given to you by your health care provider. Make sure you discuss any questions you have with your health  care provider. Document Released: 02/04/2011 Document Revised: 01/14/2016 Document Reviewed: 10/15/2014 Elsevier Interactive Patient Education  [employer].    If you have lab work done today you will be contacted with your lab results within the next 2 weeks.  If you have not heard from [name] then please contact us. The fastest way to get your results is to register for My Chart.   IF you received an x-ray today, you will receive an invoice from [facility]. Please contact  [facility] at [phone] with questions or concerns regarding your invoice.   IF you received labwork today, you will receive an invoice from [place]. Please contact [facility] at [phone] with questions or concerns regarding your invoice.   Our billing staff will not be able to assist you with questions regarding bills from these companies.  You will be contacted with the lab results as soon as they are available. The fastest way to get your results is to activate your My Chart account. Instructions are located on the last page of this paperwork. If you have not heard from [name] regarding the results in 2 weeks, please contact this office.

## 2019-02-07 ENCOUNTER — Other Ambulatory Visit: Payer: Self-pay

## 2019-02-07 DIAGNOSIS — R6889 Other general symptoms and signs: Secondary | ICD-10-CM | POA: Diagnosis not present

## 2019-02-07 DIAGNOSIS — Z20822 Contact with and (suspected) exposure to covid-19: Secondary | ICD-10-CM

## 2019-02-11 LAB — NOVEL CORONAVIRUS, NAA: SARS-CoV-2, NAA: NOT DETECTED

## 2019-02-12 ENCOUNTER — Ambulatory Visit (INDEPENDENT_AMBULATORY_CARE_PROVIDER_SITE_OTHER): Payer: PPO | Admitting: Family Medicine

## 2019-02-12 ENCOUNTER — Other Ambulatory Visit: Payer: Self-pay

## 2019-02-12 DIAGNOSIS — E785 Hyperlipidemia, unspecified: Secondary | ICD-10-CM | POA: Diagnosis not present

## 2019-02-13 LAB — COMPREHENSIVE METABOLIC PANEL
ALT: 16 IU/L (ref 0–32)
AST: 21 IU/L (ref 0–40)
Albumin/Globulin Ratio: 2.2 (ref 1.2–2.2)
Albumin: 4.3 g/dL (ref 3.8–4.8)
Alkaline Phosphatase: 60 IU/L (ref 39–117)
BUN/Creatinine Ratio: 23 (ref 12–28)
BUN: 22 mg/dL (ref 8–27)
Bilirubin Total: 0.7 mg/dL (ref 0.0–1.2)
CO2: 22 mmol/L (ref 20–29)
Calcium: 9.3 mg/dL (ref 8.7–10.3)
Chloride: 105 mmol/L (ref 96–106)
Creatinine, Ser: 0.94 mg/dL (ref 0.57–1.00)
GFR calc Af Amer: 72 mL/min/{1.73_m2} (ref >59)
GFR calc non Af Amer: 62 mL/min/{1.73_m2} (ref >59)
Globulin, Total: 2 g/dL (ref 1.5–4.5)
Glucose: 84 mg/dL (ref 65–99)
Potassium: 4.3 mmol/L (ref 3.5–5.2)
Sodium: 142 mmol/L (ref 134–144)
Total Protein: 6.3 g/dL (ref 6.0–8.5)

## 2019-02-13 LAB — LIPID PANEL
Chol/HDL Ratio: 2.1 ratio (ref 0.0–4.4)
Cholesterol, Total: 153 mg/dL (ref 100–199)
HDL: 74 mg/dL (ref >39)
LDL Calculated: 70 mg/dL (ref 0–99)
Triglycerides: 47 mg/dL (ref 0–149)
VLDL Cholesterol Cal: 9 mg/dL (ref 5–40)

## 2019-02-27 NOTE — Progress Notes (Signed)
Lab visit only. 

## 2019-03-01 ENCOUNTER — Other Ambulatory Visit: Payer: Self-pay | Admitting: Family Medicine

## 2019-03-01 DIAGNOSIS — R058 Other specified cough: Secondary | ICD-10-CM

## 2019-03-01 DIAGNOSIS — R059 Cough, unspecified: Secondary | ICD-10-CM

## 2019-03-01 DIAGNOSIS — R05 Cough: Secondary | ICD-10-CM

## 2019-03-01 NOTE — Telephone Encounter (Signed)
Forwarding medication refill to PCP for review. 

## 2019-03-02 NOTE — Telephone Encounter (Signed)
Albuterol refilled, but if frequent use or more than few days per week - needs office visit.

## 2019-04-10 DIAGNOSIS — Z03818 Encounter for observation for suspected exposure to other biological agents ruled out: Secondary | ICD-10-CM | POA: Diagnosis not present

## 2019-04-17 DIAGNOSIS — Z1231 Encounter for screening mammogram for malignant neoplasm of breast: Secondary | ICD-10-CM | POA: Diagnosis not present

## 2019-04-17 DIAGNOSIS — Z803 Family history of malignant neoplasm of breast: Secondary | ICD-10-CM | POA: Diagnosis not present

## 2019-04-24 DIAGNOSIS — R350 Frequency of micturition: Secondary | ICD-10-CM | POA: Diagnosis not present

## 2019-04-24 DIAGNOSIS — R3914 Feeling of incomplete bladder emptying: Secondary | ICD-10-CM | POA: Diagnosis not present

## 2019-04-30 ENCOUNTER — Encounter: Payer: Self-pay | Admitting: Family Medicine

## 2019-05-02 DIAGNOSIS — G8929 Other chronic pain: Secondary | ICD-10-CM | POA: Diagnosis not present

## 2019-05-02 DIAGNOSIS — M25562 Pain in left knee: Secondary | ICD-10-CM | POA: Diagnosis not present

## 2019-05-02 DIAGNOSIS — M170 Bilateral primary osteoarthritis of knee: Secondary | ICD-10-CM | POA: Diagnosis not present

## 2019-05-02 DIAGNOSIS — M25561 Pain in right knee: Secondary | ICD-10-CM | POA: Diagnosis not present

## 2019-05-08 ENCOUNTER — Ambulatory Visit: Payer: PPO | Admitting: Family Medicine

## 2019-05-20 DIAGNOSIS — H6123 Impacted cerumen, bilateral: Secondary | ICD-10-CM | POA: Diagnosis not present

## 2019-05-20 DIAGNOSIS — H9193 Unspecified hearing loss, bilateral: Secondary | ICD-10-CM | POA: Insufficient documentation

## 2019-06-03 DIAGNOSIS — H903 Sensorineural hearing loss, bilateral: Secondary | ICD-10-CM | POA: Diagnosis not present

## 2019-06-19 DIAGNOSIS — H35373 Puckering of macula, bilateral: Secondary | ICD-10-CM | POA: Diagnosis not present

## 2019-06-19 DIAGNOSIS — H2513 Age-related nuclear cataract, bilateral: Secondary | ICD-10-CM | POA: Diagnosis not present

## 2019-06-19 DIAGNOSIS — H43811 Vitreous degeneration, right eye: Secondary | ICD-10-CM | POA: Diagnosis not present

## 2019-06-19 DIAGNOSIS — H3581 Retinal edema: Secondary | ICD-10-CM | POA: Diagnosis not present

## 2019-06-26 DIAGNOSIS — Z03818 Encounter for observation for suspected exposure to other biological agents ruled out: Secondary | ICD-10-CM | POA: Diagnosis not present

## 2019-07-01 ENCOUNTER — Other Ambulatory Visit: Payer: Self-pay

## 2019-07-01 DIAGNOSIS — Z20822 Contact with and (suspected) exposure to covid-19: Secondary | ICD-10-CM

## 2019-07-04 ENCOUNTER — Encounter: Payer: Self-pay | Admitting: Family Medicine

## 2019-07-04 LAB — NOVEL CORONAVIRUS, NAA: SARS-CoV-2, NAA: NOT DETECTED

## 2019-07-08 DIAGNOSIS — H35371 Puckering of macula, right eye: Secondary | ICD-10-CM | POA: Diagnosis not present

## 2019-07-08 DIAGNOSIS — H524 Presbyopia: Secondary | ICD-10-CM | POA: Diagnosis not present

## 2019-07-08 DIAGNOSIS — H353131 Nonexudative age-related macular degeneration, bilateral, early dry stage: Secondary | ICD-10-CM | POA: Diagnosis not present

## 2019-07-08 DIAGNOSIS — H2513 Age-related nuclear cataract, bilateral: Secondary | ICD-10-CM | POA: Diagnosis not present

## 2019-07-08 DIAGNOSIS — H04123 Dry eye syndrome of bilateral lacrimal glands: Secondary | ICD-10-CM | POA: Diagnosis not present

## 2019-07-08 NOTE — Telephone Encounter (Signed)
Pt wants to know if she should get tested again or be ok after he last test which came back negative   Please Advise

## 2019-07-29 DIAGNOSIS — H2513 Age-related nuclear cataract, bilateral: Secondary | ICD-10-CM | POA: Diagnosis not present

## 2019-07-29 DIAGNOSIS — H2511 Age-related nuclear cataract, right eye: Secondary | ICD-10-CM | POA: Diagnosis not present

## 2019-08-05 DIAGNOSIS — M810 Age-related osteoporosis without current pathological fracture: Secondary | ICD-10-CM | POA: Diagnosis not present

## 2019-08-06 DIAGNOSIS — H2511 Age-related nuclear cataract, right eye: Secondary | ICD-10-CM | POA: Diagnosis not present

## 2019-08-06 DIAGNOSIS — H25811 Combined forms of age-related cataract, right eye: Secondary | ICD-10-CM | POA: Diagnosis not present

## 2019-08-08 DIAGNOSIS — M810 Age-related osteoporosis without current pathological fracture: Secondary | ICD-10-CM | POA: Diagnosis not present

## 2019-08-08 DIAGNOSIS — M1712 Unilateral primary osteoarthritis, left knee: Secondary | ICD-10-CM | POA: Diagnosis not present

## 2019-08-08 DIAGNOSIS — R5383 Other fatigue: Secondary | ICD-10-CM | POA: Diagnosis not present

## 2019-08-08 DIAGNOSIS — E559 Vitamin D deficiency, unspecified: Secondary | ICD-10-CM | POA: Diagnosis not present

## 2019-09-03 DIAGNOSIS — H43811 Vitreous degeneration, right eye: Secondary | ICD-10-CM | POA: Diagnosis not present

## 2019-09-03 DIAGNOSIS — H35373 Puckering of macula, bilateral: Secondary | ICD-10-CM | POA: Diagnosis not present

## 2019-09-03 DIAGNOSIS — H3581 Retinal edema: Secondary | ICD-10-CM | POA: Diagnosis not present

## 2019-09-03 DIAGNOSIS — H35411 Lattice degeneration of retina, right eye: Secondary | ICD-10-CM | POA: Diagnosis not present

## 2019-09-08 ENCOUNTER — Encounter: Payer: Self-pay | Admitting: Family Medicine

## 2019-09-12 DIAGNOSIS — M810 Age-related osteoporosis without current pathological fracture: Secondary | ICD-10-CM | POA: Diagnosis not present

## 2019-09-13 ENCOUNTER — Telehealth: Payer: Self-pay | Admitting: Family Medicine

## 2019-09-13 NOTE — Telephone Encounter (Signed)
Pt is checking on status of her message from 09/08/19. She would love to hear from Dr. Carlota Raspberry. Please advise at [phone]

## 2019-09-19 DIAGNOSIS — H35411 Lattice degeneration of retina, right eye: Secondary | ICD-10-CM | POA: Diagnosis not present

## 2019-09-19 DIAGNOSIS — H35371 Puckering of macula, right eye: Secondary | ICD-10-CM | POA: Diagnosis not present

## 2019-09-19 DIAGNOSIS — H3581 Retinal edema: Secondary | ICD-10-CM | POA: Diagnosis not present

## 2019-09-19 DIAGNOSIS — H26491 Other secondary cataract, right eye: Secondary | ICD-10-CM | POA: Diagnosis not present

## 2019-09-20 DIAGNOSIS — H35371 Puckering of macula, right eye: Secondary | ICD-10-CM | POA: Diagnosis not present

## 2019-09-26 ENCOUNTER — Encounter: Payer: Self-pay | Admitting: Family Medicine

## 2019-09-27 DIAGNOSIS — H35371 Puckering of macula, right eye: Secondary | ICD-10-CM | POA: Diagnosis not present

## 2019-10-01 ENCOUNTER — Other Ambulatory Visit: Payer: Self-pay | Admitting: Emergency Medicine

## 2019-10-01 DIAGNOSIS — E785 Hyperlipidemia, unspecified: Secondary | ICD-10-CM

## 2019-10-01 MED ORDER — ATORVASTATIN CALCIUM 40 MG PO TABS: 40.0000 mg | ORAL_TABLET | ORAL | 1 refills | Status: DC

## 2019-10-02 DIAGNOSIS — K589 Irritable bowel syndrome without diarrhea: Secondary | ICD-10-CM | POA: Diagnosis not present

## 2019-10-02 DIAGNOSIS — K5909 Other constipation: Secondary | ICD-10-CM | POA: Diagnosis not present

## 2019-10-10 ENCOUNTER — Other Ambulatory Visit: Payer: Self-pay

## 2019-10-10 ENCOUNTER — Telehealth (INDEPENDENT_AMBULATORY_CARE_PROVIDER_SITE_OTHER): Payer: PPO | Admitting: Family Medicine

## 2019-10-10 ENCOUNTER — Encounter: Payer: Self-pay | Admitting: Family Medicine

## 2019-10-10 VITALS — Wt 149.0 lb

## 2019-10-10 DIAGNOSIS — F439 Reaction to severe stress, unspecified: Secondary | ICD-10-CM

## 2019-10-10 DIAGNOSIS — E785 Hyperlipidemia, unspecified: Secondary | ICD-10-CM | POA: Diagnosis not present

## 2019-10-10 DIAGNOSIS — F418 Other specified anxiety disorders: Secondary | ICD-10-CM

## 2019-10-10 MED ORDER — CLONAZEPAM 0.5 MG PO TABS: 0.2500 mg | ORAL_TABLET | Freq: Two times a day (BID) | ORAL | 1 refills | Status: DC | PRN

## 2019-10-10 MED ORDER — ATORVASTATIN CALCIUM 40 MG PO TABS: 40.0000 mg | ORAL_TABLET | ORAL | 1 refills | Status: DC

## 2019-10-10 MED ORDER — SERTRALINE HCL 50 MG PO TABS: 25.0000 mg | ORAL_TABLET | Freq: Every day | ORAL | 1 refills | Status: DC

## 2019-10-10 NOTE — Progress Notes (Signed)
CC: medication refill on zoloft and would like 90 day supply with refills. No refills needed on any other meds.  No recent bp taken but weight this morning 149 lbs.  No travel outside the [name] or [place] in the last 3 weeks.

## 2019-10-10 NOTE — Patient Instructions (Addendum)
  Good talking to you today.  Hang in there with the various stressors, but I think higher dose of Zoloft at 50 mg is reasonable for now.  You can certainly adjust the dose as you see fit between one half or 1 pill.  Klonopin as needed.  Continue follow-up with counselor.  Some form of exercise each day is great, including walking.   Lab only visit next week to check your cholesterol but I will continue the same dose of Lipitor for now.  Follow-up in 6 months, but please let me know if there are questions sooner.   If you have lab work done today you will be contacted with your lab results within the next 2 weeks.  If you have not heard from [name] then please contact us. The fastest way to get your results is to register for My Chart.   IF you received an x-ray today, you will receive an invoice from [facility]. Please contact [facility] at [phone] with questions or concerns regarding your invoice.   IF you received labwork today, you will receive an invoice from [place]. Please contact [facility] at [phone] with questions or concerns regarding your invoice.   Our billing staff will not be able to assist you with questions regarding bills from these companies.  You will be contacted with the lab results as soon as they are available. The fastest way to get your results is to activate your My Chart account. Instructions are located on the last page of this paperwork. If you have not heard from [name] regarding the results in 2 weeks, please contact this office.

## 2019-10-10 NOTE — Progress Notes (Signed)
Virtual Visit via Video Note  I connected with Erika Johns on 10/10/19 at 8:18 AM by a video enabled telemedicine application and verified that I am speaking with the correct person using two identifiers.   I discussed the limitations, risks, security and privacy concerns of performing an evaluation and management service by telephone and the availability of in person appointments. I also discussed with the patient that there may be a patient responsible charge related to this service. The patient expressed understanding and agreed to proceed, consent obtained  Chief complaint:  Chief Complaint  Patient presents with  . Medication Refill     medication refill on zoloft and would like 90 day supply with refills. No refills needed on any other meds.  No recent bp taken but weight this morning 149 lbs.  No travel outside the [name] or  in the last 3 weeks.     History of Present Illness: Erika Johns is a 69 y.o. female   Depression with anxiety; Overall stable last June, continue Zoloft at 25 mg daily, with Klonopin as needed, rare use at that time. Has had some situational stressors with sister and 53 yo mom for whom she is power of attorney.  See my chart messages. Will be moving mother today from rehab to nursing area.  Still on zoloft 25mg  qd.  Klonopin still rare use. 1/2 as needed Decreased sleep last night - had been ok prior with melatonin.  belsomra episodically in the past, last filled July 16, 2019.  Controlled substance database reviewed, last clonazepam prescription for #45 filled on 02/06/2019.   Eating too much, less exercise with weather but walking - tries to walk daily. Prior exercise class was helpful.  Has been seeing counselor.  Will be driving to [place] in March.    Depression screen [facility] 2/9 10/10/2019 02/06/2019 11/26/2018 06/05/2018 03/22/2018  Decreased Interest 0 0 0 0 0  Down, Depressed, Hopeless 0 0 0 0 0  PHQ - 2 Score 0 0 0 0 0   Altered sleeping - - - - -  Tired, decreased energy - - - - -  Change in appetite - - - - -  Feeling bad or failure about yourself  - - - - -  Trouble concentrating - - - - -  Moving slowly or fidgety/restless - - - - -  Suicidal thoughts - - - - -  PHQ-9 Score - - - - -  Difficult doing work/chores - - - - -   No flowsheet data found.  Hyperlipidemia: Continued Lipitor 40 mg every other day last June.  Co-Q10 supplement discussed - not needing. No new side effects.  Lab Results  Component Value Date   CHOL 153 02/12/2019   HDL 74 02/12/2019   LDLCALC 70 02/12/2019   TRIG 47 02/12/2019   CHOLHDL 2.1 02/12/2019   Lab Results  Component Value Date   ALT 16 02/12/2019   AST 21 02/12/2019   ALKPHOS 60 02/12/2019   BILITOT 0.7 02/12/2019    Had 1st covid vaccine 2/1, repeat March 1st.   Patient Active Problem List   Diagnosis Date Noted  . Pain in joint, ankle and foot 12/25/2017  . 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/03/2017  . Achilles tendon contracture, right 05/03/2017  . Osteoarthritis of left knee 08/27/2009  . LUMBAGO 08/27/2009  . 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 HAND PAIN, LEFT 08/27/2009   Past Medical History:  Diagnosis Date  . Allergy   .  Anxiety   . Arthritis   . Constipation, chronic   . Depression   . IBS (irritable bowel syndrome)   . Insomnia   . PONV (postoperative nausea and vomiting)    severe n/v-needs scop   Past Surgical History:  Procedure Laterality Date  . COLONOSCOPY    . DILATION AND CURETTAGE OF UTERUS  2006  . FRACTURE SURGERY  1996   fx [id number]  . HARDWARE REMOVAL  1996   rt ankle  . KNEE ARTHROSCOPY  2003   lt  . KNEE ARTHROSCOPY     right  . ORIF CLAVICULAR FRACTURE Right 05/28/2013   Procedure: OPEN REDUCTION INTERNAL FIXATION (ORIF) CLAVICULAR FRACTURE;  Surgeon: Renette Butters, MD;  Location: [facility];  Service: Orthopedics;  Laterality: Right;  . TONSILLECTOMY     Allergies  Allergen  Reactions  . Effexor [Venlafaxine Hydrochloride] Other (See Comments)    dizzy  . Hydrocodone Nausea And Vomiting  . Ampicillin Rash   Prior to Admission medications   Medication Sig Start Date End Date Taking? Authorizing Provider  Ascorbic Acid (VITAMIN C) 1000 MG tablet Take 1,000 mg by mouth daily.   Yes [provider]  atorvastatin (LIPITOR) 40 MG tablet Take 1 tablet (40 mg total) by mouth every other day. 10/01/19  Yes [name], MD  Cholecalciferol (VITAMIN D3) 1000 UNITS CAPS Take by mouth daily.   Yes [provider]  clonazePAM (KLONOPIN) 0.5 MG tablet Take 0.5-1 tablets (0.25-0.5 mg total) by mouth 2 (two) times daily as needed for anxiety. 02/06/19  Yes [name], MD  diclofenac sodium (VOLTAREN) 1 % GEL Apply 2 g topically 4 (four) times daily as needed (left knee pain). 12/25/17  Yes Everrett Coombe, MD  DOCOSAHEXAENOIC ACID PO Take by mouth.   Yes [provider]  fluticasone (FLONASE) 50 MCG/ACT nasal spray Place 2 sprays into both nostrils daily. 02/06/19  Yes [name], MD  glucosamine-chondroitin 500-400 MG tablet Take 1 tablet by mouth. 2 gel caps at breakfast   Yes [provider]  OVER THE COUNTER MEDICATION    Yes [provider]  sertraline (ZOLOFT) 50 MG tablet Take 0.5 tablets (25 mg total) by mouth daily. 02/06/19  Yes [name], MD  Suvorexant (BELSOMRA) 10 MG TABS Take 10-20 mg by mouth at bedtime as needed. 02/06/19  Yes [name], MD  tamsulosin (FLOMAX) 0.4 MG CAPS capsule Take 0.4 mg by mouth.   Yes [provider]  UNABLE TO FIND Med Name: [facility]   Yes [provider]  UNABLE TO FIND Med Name: Rocco Pauls [provider]  vitamin k 100 MCG tablet Take 100 mcg by mouth daily.   Yes [provider]  albuterol (VENTOLIN HFA) 108 (90 Base) MCG/ACT inhaler INHALE 1-2 PUFFS INTO THE LUNGS EVERY 4 (FOUR) HOURS AS NEEDED FOR WHEEZING OR SHORTNESS OF  BREATH. 03/02/19   [name], MD  halobetasol (ULTRAVATE) 0.05 % cream Apply topically 2 (two) times daily. Patient not taking: Reported on 11/26/2018 06/05/18   [name], MD  polyethylene glycol [facility] Floria Raveling) packet Take 17 g by mouth daily.    [provider]   Social History   Socioeconomic History  . Marital status: Widowed    Spouse name: Not on file  . Number of children: Not on file  . Years of education: Not on file  . Highest education level: Not on file  Occupational History  . Not on file  Tobacco Use  . Smoking status: Never Smoker  . Smokeless tobacco: Never Used  Substance and Sexual Activity  . Alcohol use: Yes    Alcohol/week: 1.0 standard drinks    Types: 1 Glasses of wine per week  . Drug use: No  . Sexual activity: Yes  Other Topics Concern  . Not on file  Social History Narrative  . Not on file   Social Determinants of Health   Financial Resource Strain:   . Difficulty of Paying Living Expenses: Not on file  Food Insecurity:   . Worried About Charity fundraiser in the Last Year: Not on file  . Ran Out of Food in the Last Year: Not on file  Transportation Needs:   . Lack of Transportation (Medical): Not on file  . Lack of Transportation (Non-Medical): Not on file  Physical Activity:   . Days of Exercise per Week: Not on file  . Minutes of Exercise per Session: Not on file  Stress:   . Feeling of Stress : Not on file  Social Connections:   . Frequency of Communication with Friends and Family: Not on file  . Frequency of Social Gatherings with Friends and Family: Not on file  . Attends Religious Services: Not on file  . Active Member of Clubs or Organizations: Not on file  . Attends Archivist Meetings: Not on file  . Marital Status: Not on file  Intimate Partner Violence:   . Fear of Current or Ex-Partner: Not on file  . Emotionally Abused: Not on file  . Physically Abused: Not on file  . Sexually  Abused: Not on file    Observations/Objective: Vitals:   10/10/19 0809  Weight: 149 lb (67.6 kg)  No distress.  Euthymic mood, affect mood congruent, appropriate responses.  All questions answered with understanding of plan expressed.   Assessment and Plan: Hyperlipidemia, unspecified hyperlipidemia type  -Tolerating current regimen.  Continue Lipitor every other day.  Plan for lab only visit next week.  Depression with anxiety Situational stress  -Some increased situational stressors.  Option of 50 mg dosing of Zoloft for now.  Continue counseling.  Continue exercise in some form each day and other coping techniques discussed..  Klonopin as needed but has used infrequently.  18-month follow-up, sooner if worsening symptoms.  Follow Up Instructions: 6 months.   Patient Instructions    Good talking to you today.  Hang in there with the various stressors, but I think higher dose of Zoloft at 50 mg is reasonable for now.  You can certainly adjust the dose as you see fit between one half or 1 pill.  Klonopin as needed.  Continue follow-up with counselor.  Some form of exercise each day is great, including walking.   Lab only visit next week to check your cholesterol but I will continue the same dose of Lipitor for now.  Follow-up in 6 months, but please let me know if there are questions sooner.   If you have lab work done today you will be contacted with your lab results within the next 2 weeks.  If you have not heard from [name] then please contact us. The fastest way to get your results is to register for My Chart.   IF you received an x-ray today, you will receive an invoice from [facility]. Please contact [facility] at [phone] with questions or concerns regarding your invoice.   IF you received  labwork today, you will receive an invoice from [employer] [facility]. Please contact [facility] at [phone] with questions or concerns regarding your invoice.   Our billing  staff will not be able to assist you with questions regarding bills from these companies.  You will be contacted with the lab results as soon as they are available. The fastest way to get your results is to activate your My Chart account. Instructions are located on the last page of this paperwork. If you have not heard from [name] regarding the results in 2 weeks, please contact this office.         I discussed the assessment and treatment plan with the patient. The patient was provided an opportunity to ask questions and all were answered. The patient agreed with the plan and demonstrated an understanding of the instructions.   The patient was advised to call back or seek an in-person evaluation if the symptoms worsen or if the condition fails to improve as anticipated.  I provided 19 minutes of non-face-to-face time during this encounter.   [name], MD

## 2019-10-15 ENCOUNTER — Ambulatory Visit (INDEPENDENT_AMBULATORY_CARE_PROVIDER_SITE_OTHER): Payer: PPO | Admitting: Emergency Medicine

## 2019-10-15 ENCOUNTER — Ambulatory Visit: Payer: PPO

## 2019-10-15 ENCOUNTER — Other Ambulatory Visit: Payer: Self-pay

## 2019-10-15 DIAGNOSIS — E785 Hyperlipidemia, unspecified: Secondary | ICD-10-CM

## 2019-10-15 LAB — COMPREHENSIVE METABOLIC PANEL
ALT: 16 IU/L (ref 0–32)
AST: 19 IU/L (ref 0–40)
Albumin/Globulin Ratio: 2.2 (ref 1.2–2.2)
Albumin: 4.2 g/dL (ref 3.8–4.8)
Alkaline Phosphatase: 71 IU/L (ref 39–117)
BUN/Creatinine Ratio: 22 (ref 12–28)
BUN: 19 mg/dL (ref 8–27)
Bilirubin Total: 0.3 mg/dL (ref 0.0–1.2)
CO2: 21 mmol/L (ref 20–29)
Calcium: 8.9 mg/dL (ref 8.7–10.3)
Chloride: 107 mmol/L — ABNORMAL HIGH (ref 96–106)
Creatinine, Ser: 0.86 mg/dL (ref 0.57–1.00)
GFR calc Af Amer: 79 mL/min/{1.73_m2} (ref >59)
GFR calc non Af Amer: 69 mL/min/{1.73_m2} (ref >59)
Globulin, Total: 1.9 g/dL (ref 1.5–4.5)
Glucose: 91 mg/dL (ref 65–99)
Potassium: 4.4 mmol/L (ref 3.5–5.2)
Sodium: 143 mmol/L (ref 134–144)
Total Protein: 6.1 g/dL (ref 6.0–8.5)

## 2019-10-15 LAB — LIPID PANEL
Chol/HDL Ratio: 2 ratio (ref 0.0–4.4)
Cholesterol, Total: 150 mg/dL (ref 100–199)
HDL: 74 mg/dL (ref >39)
LDL Chol Calc (NIH): 67 mg/dL (ref 0–99)
Triglycerides: 38 mg/dL (ref 0–149)
VLDL Cholesterol Cal: 9 mg/dL (ref 5–40)

## 2019-10-16 DIAGNOSIS — Z01419 Encounter for gynecological examination (general) (routine) without abnormal findings: Secondary | ICD-10-CM | POA: Diagnosis not present

## 2019-10-17 DIAGNOSIS — M810 Age-related osteoporosis without current pathological fracture: Secondary | ICD-10-CM | POA: Diagnosis not present

## 2019-10-17 DIAGNOSIS — M1712 Unilateral primary osteoarthritis, left knee: Secondary | ICD-10-CM | POA: Diagnosis not present

## 2019-10-18 DIAGNOSIS — H3581 Retinal edema: Secondary | ICD-10-CM | POA: Diagnosis not present

## 2019-10-18 DIAGNOSIS — H35373 Puckering of macula, bilateral: Secondary | ICD-10-CM | POA: Diagnosis not present

## 2019-10-28 ENCOUNTER — Encounter: Payer: Self-pay | Admitting: Family Medicine

## 2019-10-28 DIAGNOSIS — G4700 Insomnia, unspecified: Secondary | ICD-10-CM

## 2019-10-29 ENCOUNTER — Other Ambulatory Visit: Payer: Self-pay | Admitting: Family Medicine

## 2019-10-29 MED ORDER — BELSOMRA 10 MG PO TABS: 10.0000 mg | ORAL_TABLET | Freq: Every evening | ORAL | 3 refills | Status: DC | PRN

## 2019-11-04 ENCOUNTER — Encounter: Payer: Self-pay | Admitting: Family Medicine

## 2019-11-04 NOTE — Telephone Encounter (Signed)
Pt is wondering if there is an OTC cream she can get for irritation/itchyness around her anus until she can come see you for a prescription. Please advise.

## 2019-11-05 MED ORDER — HYDROCORTISONE (PERIANAL) 2.5 % EX CREA: 1.0000 "application " | TOPICAL_CREAM | Freq: Two times a day (BID) | CUTANEOUS | 0 refills | Status: DC

## 2019-11-05 NOTE — Telephone Encounter (Signed)
I have prescribed anusol HC previously. Refill ordered, follow up if not improving or more frequent use.  If this is not covered by insurance, it appears there is a Anusol HC 1% over-the-counter that may be an option as well.  Please advise her of this plan.  Let me know if there are questions.

## 2019-12-31 ENCOUNTER — Telehealth: Payer: Self-pay | Admitting: *Deleted

## 2019-12-31 NOTE — Telephone Encounter (Signed)
Schedule AWV.  

## 2020-01-03 ENCOUNTER — Encounter: Payer: Self-pay | Admitting: Family Medicine

## 2020-01-15 DIAGNOSIS — E785 Hyperlipidemia, unspecified: Secondary | ICD-10-CM | POA: Diagnosis not present

## 2020-01-15 DIAGNOSIS — Z880 Allergy status to penicillin: Secondary | ICD-10-CM | POA: Diagnosis not present

## 2020-01-15 DIAGNOSIS — S80212A Abrasion, left knee, initial encounter: Secondary | ICD-10-CM | POA: Diagnosis not present

## 2020-01-15 DIAGNOSIS — S8012XA Contusion of left lower leg, initial encounter: Secondary | ICD-10-CM | POA: Diagnosis not present

## 2020-01-15 DIAGNOSIS — Z79899 Other long term (current) drug therapy: Secondary | ICD-10-CM | POA: Diagnosis not present

## 2020-03-10 ENCOUNTER — Ambulatory Visit (INDEPENDENT_AMBULATORY_CARE_PROVIDER_SITE_OTHER): Payer: PPO | Admitting: Emergency Medicine

## 2020-03-10 VITALS — BP 136/52 | Ht 70.75 in | Wt 149.0 lb

## 2020-03-10 DIAGNOSIS — Z0000 Encounter for general adult medical examination without abnormal findings: Secondary | ICD-10-CM | POA: Diagnosis not present

## 2020-03-10 NOTE — Progress Notes (Signed)
Presents today for [employer] Visit   Date of last exam 10-15-2019  Interpreter used for this visit? No  I connected with  Erika Johns on 03/10/20 by a telephone application and verified that I am speaking with the correct person using two identifiers.   I discussed the limitations of evaluation and management by telemedicine. The patient expressed understanding and agreed to proceed.    Patient Care Team: Erika Agreste, MD as PCP - General (Family Medicine) Erika Minion, MD as Consulting Physician (Orthopedic Surgery) [facility] [place] (Orthopedic Surgery)   Other items to address today:  Discussed Eye/Dental 12/15 cataract surgery Discussed immunizations   Other Screening: Last screening for diabetes: 10/15/2019 Last lipid screening: 10/15/2019  ADVANCE DIRECTIVES: Discussed: yes On File: no Materials Provided: no  Immunization status:  Immunization History  Administered Date(s) Administered  . Influenza Split 04/28/2012, 05/22/2013, 06/05/2014  . Influenza, High Dose Seasonal PF 06/13/2017, 05/24/2018  . Influenza,inj,Quad PF,6+ Mos 05/12/2016  . Influenza-Unspecified 05/25/2015, 06/13/2017  . Moderna SARS-COVID-2 Vaccination 09/23/2019, 10/07/2019  . Pneumococcal Conjugate-13 01/12/2015, 05/31/2015  . Pneumococcal Polysaccharide-23 09/27/2016  . Td 11/22/2007  . Tdap 03/22/2018  . Zoster 06/22/2010     There are no preventive care reminders to display for this patient.   Functional Status Survey: Is the patient deaf or have difficulty hearing?: No Does the patient have difficulty seeing, even when wearing glasses/contacts?: No Does the patient have difficulty concentrating, remembering, or making decisions?: No Does the patient have difficulty walking or climbing stairs?: No Does the patient have difficulty dressing or bathing?: No Does the patient have difficulty doing errands alone such as  visiting a doctor's office or shopping?: No   6CIT Screen 03/10/2020 11/26/2018 04/27/2017  What Year? 0 points 0 points 0 points  What month? 0 points 0 points 0 points  What time? 0 points 0 points 0 points  Count back from 20 0 points 0 points 0 points  Months in reverse 0 points 0 points 0 points  Repeat phrase 0 points 0 points 0 points  Total Score 0 0 0        Clinical Support from 03/10/2020 in [facility] [place]  AUDIT-C Score 6       Home Environment:   Lives in one story home No scattered rugs Yes grab bars Adequate lighting/ no clutter No trouble climbing stairs     Patient Active Problem List   Diagnosis Date Noted  . Pain in joint, ankle and foot 12/25/2017  . 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/03/2017  . Achilles tendon contracture, right 05/03/2017  . Osteoarthritis of left knee 08/27/2009  . LUMBAGO 08/27/2009  . 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 HAND PAIN, LEFT 08/27/2009     Past Medical History:  Diagnosis Date  . Allergy   . Anxiety   . Arthritis   . Constipation, chronic   . Depression   . Hyperlipidemia    Phreesia 03/07/2020  . IBS (irritable bowel syndrome)   . Insomnia   . PONV (postoperative nausea and vomiting)    severe n/v-needs scop     Past Surgical History:  Procedure Laterality Date  . COLONOSCOPY    . DILATION AND CURETTAGE OF UTERUS  2006  . FRACTURE SURGERY  1996   fx [id number]  . HARDWARE REMOVAL  1996   rt ankle  . KNEE ARTHROSCOPY  2003   lt  . KNEE ARTHROSCOPY     right  .  ORIF CLAVICULAR FRACTURE Right 05/28/2013   Procedure: OPEN REDUCTION INTERNAL FIXATION (ORIF) CLAVICULAR FRACTURE;  Surgeon: Erika Butters, MD;  Location: [facility];  Service: Orthopedics;  Laterality: Right;  . TONSILLECTOMY       History reviewed. No pertinent family history.   Social History   Socioeconomic History  . Marital status: Widowed    Spouse name: Not on file  . Number of children: Not on file    . Years of education: Not on file  . Highest education level: Not on file  Occupational History  . Not on file  Tobacco Use  . Smoking status: Never Smoker  . Smokeless tobacco: Never Used  Substance and Sexual Activity  . Alcohol use: Yes    Alcohol/week: 1.0 standard drink    Types: 1 Glasses of wine per week  . Drug use: No  . Sexual activity: Yes  Other Topics Concern  . Not on file  Social History Narrative  . Not on file   Social Determinants of Health   Financial Resource Strain:   . Difficulty of Paying Living Expenses:   Food Insecurity:   . Worried About Charity fundraiser in the Last Year:   . Arboriculturist in the Last Year:   Transportation Needs:   . Film/video editor (Medical):   Marland Kitchen Lack of Transportation (Non-Medical):   Physical Activity:   . Days of Exercise per Week:   . Minutes of Exercise per Session:   Stress:   . Feeling of Stress :   Social Connections:   . Frequency of Communication with Friends and Family:   . Frequency of Social Gatherings with Friends and Family:   . Attends Religious Services:   . Active Member of Clubs or Organizations:   . Attends Archivist Meetings:   Marland Kitchen Marital Status:   Intimate Partner Violence:   . Fear of Current or Ex-Partner:   . Emotionally Abused:   Marland Kitchen Physically Abused:   . Sexually Abused:      Allergies  Allergen Reactions  . Other Rash  . Effexor [Venlafaxine Hydrochloride] Other (See Comments)    dizzy  . Hydrocodone Nausea And Vomiting  . Ampicillin Rash     Prior to Admission medications   Medication Sig Start Date End Date Taking? Authorizing Provider  Ascorbic Acid (VITAMIN C) 1000 MG tablet Take 1,000 mg by mouth daily.   Yes [provider]  atorvastatin (LIPITOR) 40 MG tablet Take 1 tablet (40 mg total) by mouth every other day. 10/10/19  Yes Erika Agreste, MD  Cholecalciferol (VITAMIN D3) 1000 UNITS CAPS Take by mouth daily.   Yes [provider]   clonazePAM (KLONOPIN) 0.5 MG tablet Take 0.5-1 tablets (0.25-0.5 mg total) by mouth 2 (two) times daily as needed for anxiety. 10/10/19  Yes Erika Agreste, MD  diclofenac sodium (VOLTAREN) 1 % GEL Apply 2 g topically 4 (four) times daily as needed (left knee pain). 12/25/17  Yes Everrett Coombe, MD  DOCOSAHEXAENOIC ACID PO Take by mouth.   Yes [provider]  fluticasone (FLONASE) 50 MCG/ACT nasal spray Place 2 sprays into both nostrils daily. 02/06/19  Yes Erika Agreste, MD  glucosamine-chondroitin 500-400 MG tablet Take 1 tablet by mouth. 2 gel caps at breakfast   Yes [provider]  hydrocortisone (ANUSOL-HC) 2.5 % rectal cream Place 1 application rectally 2 (two) times daily. As needed short term for itching 11/05/19  Yes Carlota Raspberry,  Ranell Patrick, MD  OVER THE COUNTER MEDICATION    Yes [provider]  sertraline (ZOLOFT) 50 MG tablet Take 0.5-1 tablets (25-50 mg total) by mouth daily. 10/10/19  Yes Erika Agreste, MD  Suvorexant (BELSOMRA) 10 MG TABS Take 10-20 mg by mouth at bedtime as needed. 10/29/19  Yes Erika Agreste, MD  tamsulosin (FLOMAX) 0.4 MG CAPS capsule Take 0.4 mg by mouth.   Yes [provider]  UNABLE TO FIND Med Name: [facility]   Yes [provider]  UNABLE TO FIND Med Name: Rocco Pauls [provider]  vitamin k 100 MCG tablet Take 100 mcg by mouth daily.   Yes [provider]  albuterol (VENTOLIN HFA) 108 (90 Base) MCG/ACT inhaler INHALE 1-2 PUFFS INTO THE LUNGS EVERY 4 (FOUR) HOURS AS NEEDED FOR WHEEZING OR SHORTNESS OF BREATH. 03/02/19   Erika Agreste, MD  halobetasol (ULTRAVATE) 0.05 % cream Apply topically 2 (two) times daily. Patient not taking: Reported on 11/26/2018 06/05/18   Erika Agreste, MD  polyethylene glycol [facility] / Floria Raveling) packet Take 17 g by mouth daily.    [provider]     Depression screen [facility] 2/9 03/10/2020 10/10/2019 02/06/2019 11/26/2018 06/05/2018  Decreased Interest 0 0  0 0 0  Down, Depressed, Hopeless 0 0 0 0 0  PHQ - 2 Score 0 0 0 0 0  Altered sleeping - - - - -  Tired, decreased energy - - - - -  Change in appetite - - - - -  Feeling bad or failure about yourself  - - - - -  Trouble concentrating - - - - -  Moving slowly or fidgety/restless - - - - -  Suicidal thoughts - - - - -  PHQ-9 Score - - - - -  Difficult doing work/chores - - - - -     Fall Risk  03/10/2020 10/10/2019 02/06/2019 11/26/2018 06/05/2018  Falls in the past year? 0 0 0 0 No  Number falls in past yr: 0 0 0 0 -  Injury with Fall? 0 0 0 0 -  Follow up Falls evaluation completed;Education provided Falls evaluation completed Falls evaluation completed - -      PHYSICAL EXAM: Ht 5' 10.75" (1.797 m)   BMI 20.93 kg/m    Wt Readings from Last 3 Encounters:  10/10/19 149 lb (67.6 kg)  02/06/19 142 lb 3.2 oz (64.5 kg)  11/26/18 146 lb (66.2 kg)      Education/Counseling provided regarding diet and exercise, prevention of chronic diseases, smoking/tobacco cessation, if applicable, and reviewed "Covered Medicare Preventive Services."

## 2020-03-10 NOTE — Patient Instructions (Addendum)
Thank you for taking time to come for your Medicare Wellness Visit. I appreciate your ongoing commitment to your health goals. Please review the following plan we discussed and let me know if I can assist you in the future.  Leroy Kennedy LPN   Preventive Care 14 Years and Older, Female Preventive care refers to lifestyle choices and visits with your health care provider that can promote health and wellness. This includes:  A yearly physical exam. This is also called an annual well check.  Regular dental and eye exams.  Immunizations.  Screening for certain conditions.  Healthy lifestyle choices, such as diet and exercise. What can I expect for my preventive care visit? Physical exam Your health care provider will check:  Height and weight. These may be used to calculate body mass index (BMI), which is a measurement that tells if you are at a healthy weight.  Heart rate and blood pressure.  Your skin for abnormal spots. Counseling Your health care provider may ask you questions about:  Alcohol, tobacco, and drug use.  Emotional well-being.  Home and relationship well-being.  Sexual activity.  Eating habits.  History of falls.  Memory and ability to understand (cognition).  Work and work Statistician.  Pregnancy and menstrual history. What immunizations do I need?  Influenza (flu) vaccine  This is recommended every year. Tetanus, diphtheria, and pertussis (Tdap) vaccine  You may need a Td booster every 10 years. Varicella (chickenpox) vaccine  You may need this vaccine if you have not already been vaccinated. Zoster (shingles) vaccine  You may need this after age 56. Pneumococcal conjugate (PCV13) vaccine  One dose is recommended after age 28. Pneumococcal polysaccharide (PPSV23) vaccine  One dose is recommended after age 33. Measles, mumps, and rubella (MMR) vaccine  You may need at least one dose of MMR if you were born in 1957 or later. You may also  need a second dose. Meningococcal conjugate (MenACWY) vaccine  You may need this if you have certain conditions. Hepatitis A vaccine  You may need this if you have certain conditions or if you travel or work in places where you may be exposed to hepatitis A. Hepatitis B vaccine  You may need this if you have certain conditions or if you travel or work in places where you may be exposed to hepatitis B. Haemophilus influenzae type b (Hib) vaccine  You may need this if you have certain conditions. You may receive vaccines as individual doses or as more than one vaccine together in one shot (combination vaccines). Talk with your health care provider about the risks and benefits of combination vaccines. What tests do I need? Blood tests  Lipid and cholesterol levels. These may be checked every 5 years, or more frequently depending on your overall health.  Hepatitis C test.  Hepatitis B test. Screening  Lung cancer screening. You may have this screening every year starting at age 15 if you have a 30-pack-year history of smoking and currently smoke or have quit within the past 15 years.  Colorectal cancer screening. All adults should have this screening starting at age 33 and continuing until age 62. Your health care provider may recommend screening at age 64 if you are at increased risk. You will have tests every 1-10 years, depending on your results and the type of screening test.  Diabetes screening. This is done by checking your blood sugar (glucose) after you have not eaten for a while (fasting). You may have this done every  1-3 years.  Mammogram. This may be done every 1-2 years. Talk with your health care provider about how often you should have regular mammograms.  BRCA-related cancer screening. This may be done if you have a family history of breast, ovarian, tubal, or peritoneal cancers. Other tests  Sexually transmitted disease (STD) testing.  Bone density scan. This is done  to screen for osteoporosis. You may have this done starting at age 63. Follow these instructions at home: Eating and drinking  Eat a diet that includes fresh fruits and vegetables, whole grains, lean protein, and low-fat dairy products. Limit your intake of foods with high amounts of sugar, saturated fats, and salt.  Take vitamin and mineral supplements as recommended by your health care provider.  Do not drink alcohol if your health care provider tells you not to drink.  If you drink alcohol: ? Limit how much you have to 0-1 drink a day. ? Be aware of how much alcohol is in your drink. In the [place] one drink equals one 12 oz bottle of beer (355 mL), one 5 oz glass of wine (148 mL), or one 1 oz glass of hard liquor (44 mL). Lifestyle  Take daily care of your teeth and gums.  Stay active. Exercise for at least 30 minutes on 5 or more days each week.  Do not use any products that contain nicotine or tobacco, such as cigarettes, e-cigarettes, and chewing tobacco. If you need help quitting, ask your health care provider.  If you are sexually active, practice safe sex. Use a condom or other form of protection in order to prevent STIs (sexually transmitted infections).  Talk with your health care provider about taking a low-dose aspirin or statin. What's next?  Go to your health care provider once a year for a well check visit.  Ask your health care provider how often you should have your eyes and teeth checked.  Stay up to date on all vaccines. This information is not intended to replace advice given to you by your health care provider. Make sure you discuss any questions you have with your health care provider. Document Revised: 08/02/2018 Document Reviewed: 08/02/2018 Elsevier Patient Education  2020 [employer].

## 2020-03-13 DIAGNOSIS — M5431 Sciatica, right side: Secondary | ICD-10-CM | POA: Diagnosis not present

## 2020-03-13 DIAGNOSIS — M9903 Segmental and somatic dysfunction of lumbar region: Secondary | ICD-10-CM | POA: Diagnosis not present

## 2020-03-13 DIAGNOSIS — M9902 Segmental and somatic dysfunction of thoracic region: Secondary | ICD-10-CM | POA: Diagnosis not present

## 2020-03-13 DIAGNOSIS — M9905 Segmental and somatic dysfunction of pelvic region: Secondary | ICD-10-CM | POA: Diagnosis not present

## 2020-03-18 DIAGNOSIS — M9905 Segmental and somatic dysfunction of pelvic region: Secondary | ICD-10-CM | POA: Diagnosis not present

## 2020-03-18 DIAGNOSIS — M9902 Segmental and somatic dysfunction of thoracic region: Secondary | ICD-10-CM | POA: Diagnosis not present

## 2020-03-18 DIAGNOSIS — M5431 Sciatica, right side: Secondary | ICD-10-CM | POA: Diagnosis not present

## 2020-03-18 DIAGNOSIS — M9903 Segmental and somatic dysfunction of lumbar region: Secondary | ICD-10-CM | POA: Diagnosis not present

## 2020-03-30 DIAGNOSIS — H04123 Dry eye syndrome of bilateral lacrimal glands: Secondary | ICD-10-CM | POA: Diagnosis not present

## 2020-03-30 DIAGNOSIS — H2512 Age-related nuclear cataract, left eye: Secondary | ICD-10-CM | POA: Diagnosis not present

## 2020-03-30 DIAGNOSIS — Z961 Presence of intraocular lens: Secondary | ICD-10-CM | POA: Diagnosis not present

## 2020-03-30 DIAGNOSIS — H35373 Puckering of macula, bilateral: Secondary | ICD-10-CM | POA: Diagnosis not present

## 2020-03-30 DIAGNOSIS — H353131 Nonexudative age-related macular degeneration, bilateral, early dry stage: Secondary | ICD-10-CM | POA: Diagnosis not present

## 2020-04-01 DIAGNOSIS — M9902 Segmental and somatic dysfunction of thoracic region: Secondary | ICD-10-CM | POA: Diagnosis not present

## 2020-04-01 DIAGNOSIS — M9905 Segmental and somatic dysfunction of pelvic region: Secondary | ICD-10-CM | POA: Diagnosis not present

## 2020-04-01 DIAGNOSIS — M9903 Segmental and somatic dysfunction of lumbar region: Secondary | ICD-10-CM | POA: Diagnosis not present

## 2020-04-01 DIAGNOSIS — M5431 Sciatica, right side: Secondary | ICD-10-CM | POA: Diagnosis not present

## 2020-04-14 DIAGNOSIS — R5383 Other fatigue: Secondary | ICD-10-CM | POA: Diagnosis not present

## 2020-04-14 DIAGNOSIS — M810 Age-related osteoporosis without current pathological fracture: Secondary | ICD-10-CM | POA: Diagnosis not present

## 2020-04-14 DIAGNOSIS — E559 Vitamin D deficiency, unspecified: Secondary | ICD-10-CM | POA: Diagnosis not present

## 2020-04-17 DIAGNOSIS — M5431 Sciatica, right side: Secondary | ICD-10-CM | POA: Diagnosis not present

## 2020-04-17 DIAGNOSIS — M9905 Segmental and somatic dysfunction of pelvic region: Secondary | ICD-10-CM | POA: Diagnosis not present

## 2020-04-17 DIAGNOSIS — M9902 Segmental and somatic dysfunction of thoracic region: Secondary | ICD-10-CM | POA: Diagnosis not present

## 2020-04-17 DIAGNOSIS — M9903 Segmental and somatic dysfunction of lumbar region: Secondary | ICD-10-CM | POA: Diagnosis not present

## 2020-04-20 ENCOUNTER — Encounter: Payer: Self-pay | Admitting: Family Medicine

## 2020-04-20 DIAGNOSIS — Z1231 Encounter for screening mammogram for malignant neoplasm of breast: Secondary | ICD-10-CM | POA: Diagnosis not present

## 2020-04-21 DIAGNOSIS — M810 Age-related osteoporosis without current pathological fracture: Secondary | ICD-10-CM | POA: Diagnosis not present

## 2020-04-21 DIAGNOSIS — M1712 Unilateral primary osteoarthritis, left knee: Secondary | ICD-10-CM | POA: Diagnosis not present

## 2020-04-21 DIAGNOSIS — E559 Vitamin D deficiency, unspecified: Secondary | ICD-10-CM | POA: Diagnosis not present

## 2020-04-21 LAB — HM MAMMOGRAPHY

## 2020-04-30 ENCOUNTER — Ambulatory Visit (INDEPENDENT_AMBULATORY_CARE_PROVIDER_SITE_OTHER): Payer: PPO | Admitting: Family Medicine

## 2020-04-30 ENCOUNTER — Other Ambulatory Visit: Payer: Self-pay

## 2020-04-30 ENCOUNTER — Encounter: Payer: Self-pay | Admitting: Family Medicine

## 2020-04-30 VITALS — BP 121/77 | HR 53 | Temp 97.3°F | Ht 70.75 in | Wt 148.0 lb

## 2020-04-30 DIAGNOSIS — K5900 Constipation, unspecified: Secondary | ICD-10-CM

## 2020-04-30 DIAGNOSIS — F418 Other specified anxiety disorders: Secondary | ICD-10-CM

## 2020-04-30 DIAGNOSIS — R05 Cough: Secondary | ICD-10-CM

## 2020-04-30 DIAGNOSIS — R058 Other specified cough: Secondary | ICD-10-CM

## 2020-04-30 DIAGNOSIS — G4700 Insomnia, unspecified: Secondary | ICD-10-CM | POA: Diagnosis not present

## 2020-04-30 DIAGNOSIS — E785 Hyperlipidemia, unspecified: Secondary | ICD-10-CM

## 2020-04-30 DIAGNOSIS — F439 Reaction to severe stress, unspecified: Secondary | ICD-10-CM

## 2020-04-30 MED ORDER — ALBUTEROL SULFATE HFA 108 (90 BASE) MCG/ACT IN AERS: 1.0000 | INHALATION_SPRAY | RESPIRATORY_TRACT | 0 refills | Status: DC | PRN

## 2020-04-30 MED ORDER — ATORVASTATIN CALCIUM 40 MG PO TABS: 40.0000 mg | ORAL_TABLET | ORAL | 1 refills | Status: DC

## 2020-04-30 NOTE — Patient Instructions (Addendum)
   Okay to remain on sertraline.  If you feel like you would like to stop that medicine, taking 1/2 pill every other day for a week then stop it   Continue Belsomra as needed for sleep, Klonopin if needed for breakthrough anxiety.  Return for fasting lipid panel in the next week at a lab only visit.  If any further problems with constipation, follow-up and we can discuss other treatment options.  I am glad to hear that is doing better.  Albuterol was refilled if needed for exercise-induced asthma but if you have to use it more frequently or any new/worsening symptoms, follow-up to discuss further.  If you have lab work done today you will be contacted with your lab results within the next 2 weeks.  If you have not heard from [name] then please contact us. The fastest way to get your results is to register for My Chart.   IF you received an x-ray today, you will receive an invoice from [facility]. Please contact [facility] at [phone] with questions or concerns regarding your invoice.   IF you received labwork today, you will receive an invoice from [facility]. Please contact [facility] at [phone] with questions or concerns regarding your invoice.   Our billing staff will not be able to assist you with questions regarding bills from these companies.  You will be contacted with the lab results as soon as they are available. The fastest way to get your results is to activate your My Chart account. Instructions are located on the last page of this paperwork. If you have not heard from [name] regarding the results in 2 weeks, please contact this office.

## 2020-04-30 NOTE — Progress Notes (Signed)
Subjective:  Patient ID: Erika Johns, female    DOB: February 20, 1951  Age: 69 y.o. MRN: [id number]  CC:  Chief Complaint  Patient presents with  . Hyperlipidemia    not fasting , would like to have lipid profile pended for future orders, brought recent lab results in from another doctor    HPI Erika Johns presents for   Since last visit - had cataract surgery, macular puckering.  [place] this [place]- [place]. Trip went well.  Mother is 33 in nursing home,  power of attorney for her and special needs sister in assisted living.    Hyperlipidemia: Lipitor 40 mg every other day. No new myalgias.  Lab Results  Component Value Date   CHOL 150 10/15/2019   HDL 74 10/15/2019   LDLCALC 67 10/15/2019   TRIG 38 10/15/2019   CHOLHDL 2.0 10/15/2019   Lab Results  Component Value Date   ALT 16 10/15/2019   AST 19 10/15/2019   ALKPHOS 71 10/15/2019   BILITOT 0.3 10/15/2019   Osteoporosis Under the care of Dr. Layne Benton, receiving Prolia injections, last given August 31.  Blood work with Dr. Layne Benton from August 24 noted. Normal AST, ALT. Normal CBC. Normal creatinine. Normal vitamin D at 41.  Depression with Anxiety: Treated with sertraline 50mg  1/2 pill every morning. Has been on one pill per day with increased stress earlier this year. Back on 1/2 dose. No new side effects. Rare Klonopin as needed - 2 times per month.  Belsomra 5 mg as needed for sleep.some early awakenings. Taking belsomra 1/2 -1 about 3 nights per week.  Back cycling. Has albuterol if needed in past - min wheeze at end of ride.    Depression screen [facility] 2/9 04/30/2020 03/10/2020 10/10/2019 02/06/2019 11/26/2018  Decreased Interest 0 0 0 0 0  Down, Depressed, Hopeless 0 0 0 0 0  PHQ - 2 Score 0 0 0 0 0  Altered sleeping - - - - -  Tired, decreased energy - - - - -  Change in appetite - - - - -  Feeling bad or failure about yourself  - - - - -  Trouble concentrating - - - - -    Moving slowly or fidgety/restless - - - - -  Suicidal thoughts - - - - -  PHQ-9 Score - - - - -  Difficult doing work/chores - - - - -   No flowsheet data found.  Chronic constipation: Better with glycerin suppositories. Has tried multiple other strategies.  BM daily. Doing ok now. Dried figs have helped as well.    History Patient Active Problem List   Diagnosis Date Noted  . Pain in joint, ankle and foot 12/25/2017  . 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/03/2017  . Achilles tendon contracture, right 05/03/2017  . Osteoarthritis of left knee 08/27/2009  . LUMBAGO 08/27/2009  . 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 HAND PAIN, LEFT 08/27/2009   Past Medical History:  Diagnosis Date  . Allergy   . Anxiety   . Arthritis   . Constipation, chronic   . Depression   . Hyperlipidemia    Phreesia 03/07/2020  . IBS (irritable bowel syndrome)   . Insomnia   . PONV (postoperative nausea and vomiting)    severe n/v-needs scop   Past Surgical History:  Procedure Laterality Date  . COLONOSCOPY    . DILATION AND CURETTAGE OF UTERUS  2006  . [place]  fx tib-fib-rt  . HARDWARE REMOVAL  1996   rt ankle  . KNEE ARTHROSCOPY  2003   lt  . KNEE ARTHROSCOPY     right  . ORIF CLAVICULAR FRACTURE Right 05/28/2013   Procedure: OPEN REDUCTION INTERNAL FIXATION (ORIF) CLAVICULAR FRACTURE;  Surgeon: Renette Butters, MD;  Location: [facility];  Service: Orthopedics;  Laterality: Right;  . TONSILLECTOMY     Allergies  Allergen Reactions  . Other Rash  . Effexor [Venlafaxine Hydrochloride] Other (See Comments)    dizzy  . Hydrocodone Nausea And Vomiting  . Ampicillin Rash   Prior to Admission medications   Medication Sig Start Date End Date Taking? Authorizing Provider  Ascorbic Acid (VITAMIN C) 1000 MG tablet Take 1,000 mg by mouth daily.   Yes [provider]  atorvastatin (LIPITOR) 40 MG tablet Take 1 tablet (40 mg total) by mouth every other day.  10/10/19  Yes [name], MD  Cholecalciferol (VITAMIN D3) 1000 UNITS CAPS Take by mouth daily.   Yes [provider]  clonazePAM (KLONOPIN) 0.5 MG tablet Take 0.5-1 tablets (0.25-0.5 mg total) by mouth 2 (two) times daily as needed for anxiety. 10/10/19  Yes [name], MD  denosumab (PROLIA) 60 MG/ML SOSY injection Inject 60 mg into the skin every 6 (six) months.   Yes [provider]  diclofenac sodium (VOLTAREN) 1 % GEL Apply 2 g topically 4 (four) times daily as needed (left knee pain). 12/25/17  Yes Everrett Coombe, MD  fluticasone (FLONASE) 50 MCG/ACT nasal spray Place 2 sprays into both nostrils daily. 02/06/19  Yes [name], MD  glucosamine-chondroitin 500-400 MG tablet Take 1 tablet by mouth. 2 gel caps at breakfast   Yes [provider]  hydrocortisone (ANUSOL-HC) 2.5 % rectal cream Place 1 application rectally 2 (two) times daily. As needed short term for itching 11/05/19  Yes [name], MD  OVER THE COUNTER MEDICATION    Yes [provider]  sertraline (ZOLOFT) 50 MG tablet Take 0.5-1 tablets (25-50 mg total) by mouth daily. 10/10/19  Yes [name], MD  Suvorexant (BELSOMRA) 10 MG TABS Take 10-20 mg by mouth at bedtime as needed. 10/29/19  Yes [name], MD  tamsulosin (FLOMAX) 0.4 MG CAPS capsule Take 0.4 mg by mouth.   Yes [provider]  UNABLE TO FIND Med Name: [facility]   Yes [provider]  UNABLE TO FIND Med Name: [facility] [provider]  vitamin k 100 MCG tablet Take 100 mcg by mouth daily.   Yes [provider]  albuterol (VENTOLIN HFA) 108 (90 Base) MCG/ACT inhaler INHALE 1-2 PUFFS INTO THE LUNGS EVERY 4 (FOUR) HOURS AS NEEDED FOR WHEEZING OR SHORTNESS OF BREATH. Patient not taking: Reported on 04/30/2020 03/02/19   [name], MD  halobetasol (ULTRAVATE) 0.05 % cream Apply topically 2 (two) times daily. Patient not taking: Reported on 11/26/2018 06/05/18    [name], MD   Social History   Socioeconomic History  . Marital status: Widowed    Spouse name: Not on file  . Number of children: Not on file  . Years of education: Not on file  . Highest education level: Not on file  Occupational History  . Not on file  Tobacco Use  . Smoking status: Never Smoker  . Smokeless tobacco: Never Used  Substance and Sexual Activity  . Alcohol use: Yes    Alcohol/week: 1.0 standard drink  Types: 1 Glasses of wine per week  . Drug use: No  . Sexual activity: Yes  Other Topics Concern  . Not on file  Social History Narrative  . Not on file   Social Determinants of Health   Financial Resource Strain:   . Difficulty of Paying Living Expenses: Not on file  Food Insecurity:   . Worried About Charity fundraiser in the Last Year: Not on file  . Ran Out of Food in the Last Year: Not on file  Transportation Needs:   . Lack of Transportation (Medical): Not on file  . Lack of Transportation (Non-Medical): Not on file  Physical Activity:   . Days of Exercise per Week: Not on file  . Minutes of Exercise per Session: Not on file  Stress:   . Feeling of Stress : Not on file  Social Connections:   . Frequency of Communication with Friends and Family: Not on file  . Frequency of Social Gatherings with Friends and Family: Not on file  . Attends Religious Services: Not on file  . Active Member of Clubs or Organizations: Not on file  . Attends Archivist Meetings: Not on file  . Marital Status: Not on file  Intimate Partner Violence:   . Fear of Current or Ex-Partner: Not on file  . Emotionally Abused: Not on file  . Physically Abused: Not on file  . Sexually Abused: Not on file    Review of Systems Per HPI.   Objective:   Vitals:   04/30/20 1050  BP: 121/77  Pulse: (!) 53  Temp: (!) 97.3 F (36.3 C)  SpO2: 97%  Weight: 148 lb (67.1 kg)  Height: 5' 10.75" (1.797 m)     Physical Exam Vitals reviewed.    Constitutional:      Appearance: She is well-developed.  HENT:     Head: Normocephalic and atraumatic.  Eyes:     Conjunctiva/sclera: Conjunctivae normal.     Pupils: Pupils are equal, round, and reactive to light.  Neck:     Vascular: No carotid bruit.  Cardiovascular:     Rate and Rhythm: Normal rate and regular rhythm.     Heart sounds: Normal heart sounds.  Pulmonary:     Effort: Pulmonary effort is normal.     Breath sounds: Normal breath sounds.  Abdominal:     Palpations: Abdomen is soft. There is no pulsatile mass.     Tenderness: There is no abdominal tenderness.  Skin:    General: Skin is warm and dry.  Neurological:     Mental Status: She is alert and oriented to person, place, and time.  Psychiatric:        Behavior: Behavior normal.        Assessment & Plan:  Erika Johns is a 69 y.o. female . Hyperlipidemia, unspecified hyperlipidemia type - Plan: Lipid panel, atorvastatin (LIPITOR) 40 MG tablet   -Tolerating Lipitor with every other day dosing, check labs.  CMP reviewed from outside office without concerns on August 24.  Insomnia, unspecified type  -Has Belsomra as needed, RTC precautions given.  Situational stress Depression with anxiety  -We will continue low-dose sertraline at 25 mg total per day for now.  If she does decide to stop that medication, every other day dosing for a week then stop should be sufficient.  Has Klonopin if needed for breakthrough anxiety.  Still some social stressors with family.  Overall doing well at this time.  Upper airway  cough syndrome - Plan: albuterol (VENTOLIN HFA) 108 (90 Base) MCG/ACT inhaler  -Suspected mild reactive airway/exercise-induced asthma, minimal symptoms after a long ride.  Has albuterol if needed, will refill.  RTC precautions if increased frequency or new symptoms.  Constipation Controlled with current regimen.  RTC precautions  Meds ordered this encounter  Medications  . albuterol  (VENTOLIN HFA) 108 (90 Base) MCG/ACT inhaler    Sig: Inhale 1-2 puffs into the lungs every 4 (four) hours as needed for wheezing or shortness of breath.    Dispense:  18 g    Refill:  0  . atorvastatin (LIPITOR) 40 MG tablet    Sig: Take 1 tablet (40 mg total) by mouth every other day.    Dispense:  45 tablet    Refill:  1    Refill [id number]   Patient Instructions     Okay to remain on sertraline.  If you feel like you would like to stop that medicine, taking 1/2 pill every other day for a week then stop it   Continue Belsomra as needed for sleep, Klonopin if needed for breakthrough anxiety.  Return for fasting lipid panel in the next week at a lab only visit.  If any further problems with constipation, follow-up and we can discuss other treatment options.  I am glad to hear that is doing better.  Albuterol was refilled if needed for exercise-induced asthma but if you have to use it more frequently or any new/worsening symptoms, follow-up to discuss further.  If you have lab work done today you will be contacted with your lab results within the next 2 weeks.  If you have not heard from [name] then please contact us. The fastest way to get your results is to register for My Chart.   IF you received an x-ray today, you will receive an invoice from [facility]. Please contact [facility] at [phone] with questions or concerns regarding your invoice.   IF you received labwork today, you will receive an invoice from [facility]. Please contact [facility] at [phone] with questions or concerns regarding your invoice.   Our billing staff will not be able to assist you with questions regarding bills from these companies.  You will be contacted with the lab results as soon as they are available. The fastest way to get your results is to activate your My Chart account. Instructions are located on the last page of this paperwork. If you have not heard from [name] regarding the results  in 2 weeks, please contact this office.          Signed, Merri Ray, MD [facility]

## 2020-05-01 ENCOUNTER — Ambulatory Visit (INDEPENDENT_AMBULATORY_CARE_PROVIDER_SITE_OTHER): Payer: PPO | Admitting: Family Medicine

## 2020-05-01 DIAGNOSIS — E785 Hyperlipidemia, unspecified: Secondary | ICD-10-CM

## 2020-05-02 LAB — LIPID PANEL
Chol/HDL Ratio: 2.5 ratio (ref 0.0–4.4)
Cholesterol, Total: 168 mg/dL (ref 100–199)
HDL: 66 mg/dL (ref >39)
LDL Chol Calc (NIH): 89 mg/dL (ref 0–99)
Triglycerides: 70 mg/dL (ref 0–149)
VLDL Cholesterol Cal: 13 mg/dL (ref 5–40)

## 2020-05-08 DIAGNOSIS — M9902 Segmental and somatic dysfunction of thoracic region: Secondary | ICD-10-CM | POA: Diagnosis not present

## 2020-05-08 DIAGNOSIS — M9905 Segmental and somatic dysfunction of pelvic region: Secondary | ICD-10-CM | POA: Diagnosis not present

## 2020-05-08 DIAGNOSIS — M5431 Sciatica, right side: Secondary | ICD-10-CM | POA: Diagnosis not present

## 2020-05-08 DIAGNOSIS — M9903 Segmental and somatic dysfunction of lumbar region: Secondary | ICD-10-CM | POA: Diagnosis not present

## 2020-05-15 DIAGNOSIS — R3914 Feeling of incomplete bladder emptying: Secondary | ICD-10-CM | POA: Diagnosis not present

## 2020-05-15 DIAGNOSIS — R350 Frequency of micturition: Secondary | ICD-10-CM | POA: Diagnosis not present

## 2020-05-26 ENCOUNTER — Encounter: Payer: Self-pay | Admitting: Podiatry

## 2020-05-26 ENCOUNTER — Ambulatory Visit (INDEPENDENT_AMBULATORY_CARE_PROVIDER_SITE_OTHER): Payer: PPO | Admitting: Podiatry

## 2020-05-26 ENCOUNTER — Other Ambulatory Visit: Payer: Self-pay

## 2020-05-26 DIAGNOSIS — M778 Other enthesopathies, not elsewhere classified: Secondary | ICD-10-CM

## 2020-05-26 NOTE — Progress Notes (Signed)
She presents today for follow-up of her capsulitis second metatarsophalangeal joint of the right foot states that I am she can have her surgery right now she would like to talk to [facility] about orthotics.  She is already talked rate prior to me entering the room.  She goes on to say that she has numbness and burning pins-and-needles type sensation although at the lateral aspect of her foot from the second metatarsophalangeal joint in a curvilinear fashion.  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  Assessment: Bursitis capsulitis hammertoe deformity right.  Plantarflexed metatarsal right.  Plan: Discussed etiology pathology conservative surgical therapies at this point time she will need redo of her orthotics and I will follow-up with her on an as-needed basis.

## 2020-06-25 DIAGNOSIS — M170 Bilateral primary osteoarthritis of knee: Secondary | ICD-10-CM | POA: Diagnosis not present

## 2020-07-06 ENCOUNTER — Encounter: Payer: Self-pay | Admitting: Family Medicine

## 2020-07-06 DIAGNOSIS — G4700 Insomnia, unspecified: Secondary | ICD-10-CM

## 2020-07-07 ENCOUNTER — Other Ambulatory Visit: Payer: Self-pay

## 2020-07-07 DIAGNOSIS — G4700 Insomnia, unspecified: Secondary | ICD-10-CM

## 2020-07-07 MED ORDER — BELSOMRA 10 MG PO TABS: 10.0000 mg | ORAL_TABLET | Freq: Every evening | ORAL | 3 refills | Status: DC | PRN

## 2020-07-07 NOTE — Telephone Encounter (Signed)
Patient is requesting a refill of the following medications: Requested Prescriptions   Pending Prescriptions Disp Refills  . Suvorexant (BELSOMRA) 10 MG TABS 10 tablet 3    Sig: Take 10-20 mg by mouth at bedtime as needed.    Date of patient request: 07/07/2020 Last office visit: 05/01/2020 Date of last refill: 10/29/2019 Last refill amount: 10x3

## 2020-07-07 NOTE — Telephone Encounter (Signed)
Attempted to call patient to inform patient to let her know that the medication was sent to the pharmacy. LVM

## 2020-08-03 ENCOUNTER — Encounter: Payer: Self-pay | Admitting: Family Medicine

## 2020-08-05 ENCOUNTER — Encounter: Payer: Self-pay | Admitting: Family Medicine

## 2020-08-05 DIAGNOSIS — F418 Other specified anxiety disorders: Secondary | ICD-10-CM

## 2020-08-05 DIAGNOSIS — F439 Reaction to severe stress, unspecified: Secondary | ICD-10-CM

## 2020-08-07 MED ORDER — SERTRALINE HCL 50 MG PO TABS: 25.0000 mg | ORAL_TABLET | Freq: Every day | ORAL | 0 refills | Status: DC

## 2020-08-07 NOTE — Telephone Encounter (Signed)
Pt came in to see if she can get a refiil / pt will be out in 10 days clinical will fill 90 day and appt scheduled for 09/03/2019

## 2020-08-07 NOTE — Telephone Encounter (Signed)
Pt came in and Erika Johns has given me the bottle of the sertraline. We looked at the last office visit and pt stated that she did not stop this medication. I have given her a one time 90 day refill and informed her that she needs to follow up with provider to discuss the medication for future refills.

## 2020-08-10 ENCOUNTER — Other Ambulatory Visit: Payer: Self-pay | Admitting: Family Medicine

## 2020-08-10 DIAGNOSIS — R058 Other specified cough: Secondary | ICD-10-CM

## 2020-08-10 DIAGNOSIS — Z20822 Contact with and (suspected) exposure to covid-19: Secondary | ICD-10-CM | POA: Diagnosis not present

## 2020-08-10 DIAGNOSIS — R059 Cough, unspecified: Secondary | ICD-10-CM

## 2020-08-10 NOTE — Telephone Encounter (Signed)
Requested Prescriptions  Pending Prescriptions Disp Refills  . fluticasone (FLONASE) 50 MCG/ACT nasal spray [Pharmacy Med Name: FLUTICASONE PROP 50 MCG SPRAY] 16 mL 6    Sig: SPRAY 2 SPRAYS INTO EACH NOSTRIL EVERY DAY     Ear, Nose, and Throat: Nasal Preparations - Corticosteroids Passed - 08/10/2020 11:39 AM      Passed - Valid encounter within last 12 months    Recent Outpatient Visits          3 months ago Hyperlipidemia, unspecified hyperlipidemia type   [facility] at [facility], Ranell Patrick, MD   3 months ago Hyperlipidemia, unspecified hyperlipidemia type   [facility] at [facility], Ranell Patrick, MD   5 months ago Medicare annual wellness visit, subsequent   Primary Care at [facility], [place], MD   10 months ago Hyperlipidemia, unspecified hyperlipidemia type   Primary Care at [facility], [place], MD   10 months ago Hyperlipidemia, unspecified hyperlipidemia type   [facility] at [facility], Ranell Patrick, MD      Future Appointments            In 3 weeks Carlota Raspberry Ranell Patrick, MD Primary Care at [place], [facility]

## 2020-08-10 NOTE — Telephone Encounter (Signed)
Refill given message closed

## 2020-08-11 ENCOUNTER — Other Ambulatory Visit: Payer: Self-pay

## 2020-08-11 ENCOUNTER — Telehealth (INDEPENDENT_AMBULATORY_CARE_PROVIDER_SITE_OTHER): Payer: PPO | Admitting: Family Medicine

## 2020-08-11 ENCOUNTER — Encounter: Payer: Self-pay | Admitting: Family Medicine

## 2020-08-11 VITALS — Temp 98.5°F

## 2020-08-11 DIAGNOSIS — T7840XD Allergy, unspecified, subsequent encounter: Secondary | ICD-10-CM

## 2020-08-11 DIAGNOSIS — R058 Other specified cough: Secondary | ICD-10-CM | POA: Diagnosis not present

## 2020-08-11 DIAGNOSIS — R059 Cough, unspecified: Secondary | ICD-10-CM | POA: Diagnosis not present

## 2020-08-11 MED ORDER — CETIRIZINE HCL 10 MG PO TABS: 10.0000 mg | ORAL_TABLET | Freq: Every day | ORAL | 3 refills | Status: DC

## 2020-08-11 MED ORDER — FLUTICASONE PROPIONATE 50 MCG/ACT NA SUSP: NASAL | 6 refills | Status: DC

## 2020-08-11 MED ORDER — ALBUTEROL SULFATE HFA 108 (90 BASE) MCG/ACT IN AERS: 1.0000 | INHALATION_SPRAY | RESPIRATORY_TRACT | 2 refills | Status: DC | PRN

## 2020-08-11 NOTE — Progress Notes (Signed)
Virtual Visit Note  I connected withpatient on 08/11/20 at 1628 by telephone due to unable to work Epic video visit and verified that I am speaking with the correct person using two identifiers. Erika Johns is currently located at home and no family members are currently with them during visit. The provider, Laurita Quint Ethyle Tiedt, FNP is located in their office at time of visit.  I discussed the limitations, risks, security and privacy concerns of performing an evaluation and management service by telephone and the availability of in person appointments. I also discussed with the patient that there may be a patient responsible charge related to this service. The patient expressed understanding and agreed to proceed.   I provided 20 minutes of non-face-to-face time during this encounter.      Chief Complaint  Patient presents with  . Cough    X 7-10 days w/ post nasal drip, feeling of breathlessness and cough w/ talking at times RX for allergy medications     HPI ? Will see Dr. Carlota Raspberry 12/12 Needs Zoloft refill Vaccinations up to date Last 10 days has been having asthmatic cough Feels like she may be getting a sore throat Sometimes with talking notices a cough  Got a COVID test at [facility] yesterday, no results yet Nose is not running, but feels congested Symptoms are worse after biking outside           Allergies  Allergen Reactions  . Other Rash  . Effexor [Venlafaxine Hydrochloride] Other (See Comments)    dizzy  . Hydrocodone Nausea And Vomiting  . Ampicillin Rash           Prior to Admission medications   Medication Sig Start Date End Date Taking? Authorizing Provider  albuterol (VENTOLIN HFA) 108 (90 Base) MCG/ACT inhaler Inhale 1-2 puffs into the lungs every 4 (four) hours as needed for wheezing or shortness of breath. Patient not taking: Reported on 08/11/2020 04/30/20   [name], MD  Ascorbic Acid (VITAMIN C) 1000 MG tablet Take  1,000 mg by mouth daily.    [provider]  atorvastatin (LIPITOR) 40 MG tablet Take 1 tablet (40 mg total) by mouth every other day. 04/30/20   [name], MD  Cholecalciferol (VITAMIN D3) 1000 UNITS CAPS Take by mouth daily.    [provider]  clonazePAM (KLONOPIN) 0.5 MG tablet Take 0.5-1 tablets (0.25-0.5 mg total) by mouth 2 (two) times daily as needed for anxiety. 10/10/19   [name], MD  denosumab (PROLIA) 60 MG/ML SOSY injection Inject 60 mg into the skin every 6 (six) months.    [provider]  diclofenac sodium (VOLTAREN) 1 % GEL Apply 2 g topically 4 (four) times daily as needed (left knee pain). 12/25/17   Everrett Coombe, MD  fluticasone Asencion Islam) 50 MCG/ACT nasal spray SPRAY 2 SPRAYS INTO EACH NOSTRIL EVERY DAY 08/10/20   [name], MD  glucosamine-chondroitin 500-400 MG tablet Take 1 tablet by mouth. 2 gel caps at breakfast    [provider]  halobetasol (ULTRAVATE) 0.05 % cream Apply topically 2 (two) times daily. Patient not taking: Reported on 11/26/2018 06/05/18   [name], MD  hydrocortisone (ANUSOL-HC) 2.5 % rectal cream Place 1 application rectally 2 (two) times daily. As needed short term for itching 11/05/19   [name], MD  OVER THE COUNTER MEDICATION     [provider]  sertraline (ZOLOFT) 50 MG tablet Take 0.5-1 tablets (25-50 mg total) by  mouth daily. 08/07/20   [name], MD  Suvorexant (BELSOMRA) 10 MG TABS Take 10-20 mg by mouth at bedtime as needed. 07/07/20   [name], MD  tamsulosin (FLOMAX) 0.4 MG CAPS capsule Take 0.4 mg by mouth.    [provider]  UNABLE TO FIND Med Name: [facility]    [provider]  UNABLE TO FIND Med Name: [facility]    [provider]  vitamin k 100 MCG tablet Take 100 mcg by mouth daily.    [provider]        Past Medical History:  Diagnosis Date  . Allergy    . Anxiety   . Arthritis   . Constipation, chronic   . Depression   . Hyperlipidemia    Phreesia 03/07/2020  . IBS (irritable bowel syndrome)   . Insomnia   . PONV (postoperative nausea and vomiting)    severe n/v-needs scop         Past Surgical History:  Procedure Laterality Date  . COLONOSCOPY    . DILATION AND CURETTAGE OF UTERUS  2006  . FRACTURE SURGERY  1996   fx tib-fib-rt  . HARDWARE REMOVAL  1996   rt ankle  . KNEE ARTHROSCOPY  2003   lt  . KNEE ARTHROSCOPY     right  . ORIF CLAVICULAR FRACTURE Right 05/28/2013   Procedure: OPEN REDUCTION INTERNAL FIXATION (ORIF) CLAVICULAR FRACTURE;  Surgeon: Renette Butters, MD;  Location: [facility];  Service: Orthopedics;  Laterality: Right;  . TONSILLECTOMY      Social History        Tobacco Use  . Smoking status: Never Smoker  . Smokeless tobacco: Never Used  Substance Use Topics  . Alcohol use: Yes    Alcohol/week: 1.0 standard drink    Types: 1 Glasses of wine per week    No family history on file.   Review of Systems  Constitutional: Negative for chills, fever and malaise/fatigue.  HENT: Negative for congestion, ear pain and sore throat.   Eyes: Negative for blurred vision, double vision, discharge and redness.  Respiratory: Positive for cough and wheezing. Negative for shortness of breath.   Cardiovascular: Negative for chest pain.  Musculoskeletal: Negative for myalgias.      Objective    Constitutional:  General: She is not in acute distress. Appearance: Normal appearance. She is notill-appearing.  Pulmonary:  Effort: Pulmonary effort is normal. Norespiratory distress.  Neurological:  Mental Status: She is alertand oriented to person, place, and time.  Psychiatric:  Mood and Affect: Moodnormal.  Behavior: Behaviornormal.     ASSESSMENT and PLAN  Problem List Items Addressed This Visit   None   Visit  Diagnoses    Upper airway cough syndrome    -  Primary   Relevant Medications   albuterol (VENTOLIN HFA) 108 (90 Base) MCG/ACT inhaler   Cough       Allergy, subsequent encounter       Relevant Medications   fluticasone (FLONASE) 50 MCG/ACT nasal spray   cetirizine (ZYRTEC) 10 MG tablet at night  R/se/b of medications discussed RTC precautions provided      Return in about 4 weeks (around 09/08/2020) for Regularly scheduled appointment.    The above assessment and management plan was discussed with the patient. The patient verbalized understanding of and has agreed to the management plan. Patient is aware to call the clinic if symptoms persist or worsen. Patient is aware when to return to the  clinic for a follow-up visit. Patient educated on when it is appropriate to go to the emergency department.     Huston Foley Deneisha Dade, FNP-BC Primary Care at [place],  [address] Ph.  [phone] Fax [phone]

## 2020-09-02 ENCOUNTER — Ambulatory Visit: Payer: PPO | Admitting: Family Medicine

## 2020-09-13 DIAGNOSIS — S63502A Unspecified sprain of left wrist, initial encounter: Secondary | ICD-10-CM | POA: Diagnosis not present

## 2020-09-30 DIAGNOSIS — M5431 Sciatica, right side: Secondary | ICD-10-CM | POA: Diagnosis not present

## 2020-09-30 DIAGNOSIS — M9906 Segmental and somatic dysfunction of lower extremity: Secondary | ICD-10-CM | POA: Diagnosis not present

## 2020-09-30 DIAGNOSIS — M9905 Segmental and somatic dysfunction of pelvic region: Secondary | ICD-10-CM | POA: Diagnosis not present

## 2020-09-30 DIAGNOSIS — M9903 Segmental and somatic dysfunction of lumbar region: Secondary | ICD-10-CM | POA: Diagnosis not present

## 2020-10-05 DIAGNOSIS — M5431 Sciatica, right side: Secondary | ICD-10-CM | POA: Diagnosis not present

## 2020-10-05 DIAGNOSIS — M9903 Segmental and somatic dysfunction of lumbar region: Secondary | ICD-10-CM | POA: Diagnosis not present

## 2020-10-05 DIAGNOSIS — M9905 Segmental and somatic dysfunction of pelvic region: Secondary | ICD-10-CM | POA: Diagnosis not present

## 2020-10-05 DIAGNOSIS — M9906 Segmental and somatic dysfunction of lower extremity: Secondary | ICD-10-CM | POA: Diagnosis not present

## 2020-10-19 DIAGNOSIS — M810 Age-related osteoporosis without current pathological fracture: Secondary | ICD-10-CM | POA: Diagnosis not present

## 2020-10-19 DIAGNOSIS — E559 Vitamin D deficiency, unspecified: Secondary | ICD-10-CM | POA: Diagnosis not present

## 2020-10-19 DIAGNOSIS — R5383 Other fatigue: Secondary | ICD-10-CM | POA: Diagnosis not present

## 2020-10-20 DIAGNOSIS — M1711 Unilateral primary osteoarthritis, right knee: Secondary | ICD-10-CM | POA: Diagnosis not present

## 2020-10-21 ENCOUNTER — Encounter: Payer: Self-pay | Admitting: Family Medicine

## 2020-10-22 DIAGNOSIS — E559 Vitamin D deficiency, unspecified: Secondary | ICD-10-CM | POA: Diagnosis not present

## 2020-10-22 DIAGNOSIS — M810 Age-related osteoporosis without current pathological fracture: Secondary | ICD-10-CM | POA: Diagnosis not present

## 2020-10-27 DIAGNOSIS — Z6821 Body mass index (BMI) 21.0-21.9, adult: Secondary | ICD-10-CM | POA: Diagnosis not present

## 2020-10-27 DIAGNOSIS — Z01419 Encounter for gynecological examination (general) (routine) without abnormal findings: Secondary | ICD-10-CM | POA: Diagnosis not present

## 2020-10-27 DIAGNOSIS — Z124 Encounter for screening for malignant neoplasm of cervix: Secondary | ICD-10-CM | POA: Diagnosis not present

## 2020-10-27 DIAGNOSIS — Z01411 Encounter for gynecological examination (general) (routine) with abnormal findings: Secondary | ICD-10-CM | POA: Diagnosis not present

## 2020-10-30 ENCOUNTER — Telehealth: Payer: Self-pay

## 2020-10-30 NOTE — Telephone Encounter (Signed)
PT. Called requesting cholesterol lab orders be placed so she can get lab work done ahead of her visit on 11/09/2020

## 2020-10-31 ENCOUNTER — Other Ambulatory Visit: Payer: Self-pay | Admitting: Family Medicine

## 2020-10-31 DIAGNOSIS — F439 Reaction to severe stress, unspecified: Secondary | ICD-10-CM

## 2020-10-31 DIAGNOSIS — F418 Other specified anxiety disorders: Secondary | ICD-10-CM

## 2020-10-31 NOTE — Telephone Encounter (Signed)
Requested Prescriptions  Pending Prescriptions Disp Refills  . sertraline (ZOLOFT) 50 MG tablet [Pharmacy Med Name: SERTRALINE HCL 50 MG TABLET] 90 tablet 0    Sig: TAKE 0.5-1 TABLETS (25-50 MG TOTAL) BY MOUTH DAILY.     Psychiatry:  Antidepressants - SSRI Passed - 10/31/2020  8:32 AM      Passed - Valid encounter within last 6 months    Recent Outpatient Visits          2 months ago Upper airway cough syndrome   Primary Care at [place], Laurita Quint, FNP   6 months ago Hyperlipidemia, unspecified hyperlipidemia type   [facility] Care at [place], MD   6 months ago Hyperlipidemia, unspecified hyperlipidemia type   [facility] Care at [facility], Ranell Patrick, MD   7 months ago Medicare annual wellness visit, subsequent   Primary Care at [facility], Ines Bloomer, MD   1 year ago Hyperlipidemia, unspecified hyperlipidemia type   Primary Care at [facility], Ines Bloomer, MD      Future Appointments            In 1 week Carlota Raspberry Ranell Patrick, MD Primary Care at [place], [facility]

## 2020-11-03 DIAGNOSIS — L304 Erythema intertrigo: Secondary | ICD-10-CM | POA: Diagnosis not present

## 2020-11-03 DIAGNOSIS — L821 Other seborrheic keratosis: Secondary | ICD-10-CM | POA: Diagnosis not present

## 2020-11-03 DIAGNOSIS — D2239 Melanocytic nevi of other parts of face: Secondary | ICD-10-CM | POA: Diagnosis not present

## 2020-11-03 DIAGNOSIS — D3617 Benign neoplasm of peripheral nerves and autonomic nervous system of trunk, unspecified: Secondary | ICD-10-CM | POA: Diagnosis not present

## 2020-11-03 DIAGNOSIS — L820 Inflamed seborrheic keratosis: Secondary | ICD-10-CM | POA: Diagnosis not present

## 2020-11-04 ENCOUNTER — Ambulatory Visit (INDEPENDENT_AMBULATORY_CARE_PROVIDER_SITE_OTHER): Payer: PPO | Admitting: Family Medicine

## 2020-11-04 ENCOUNTER — Other Ambulatory Visit: Payer: Self-pay | Admitting: Emergency Medicine

## 2020-11-04 ENCOUNTER — Other Ambulatory Visit: Payer: Self-pay

## 2020-11-04 DIAGNOSIS — E785 Hyperlipidemia, unspecified: Secondary | ICD-10-CM | POA: Diagnosis not present

## 2020-11-05 LAB — COMPREHENSIVE METABOLIC PANEL
ALT: 25 IU/L (ref 0–32)
AST: 25 IU/L (ref 0–40)
Albumin/Globulin Ratio: 1.9 (ref 1.2–2.2)
Albumin: 4 g/dL (ref 3.7–4.7)
Alkaline Phosphatase: 55 IU/L (ref 44–121)
BUN/Creatinine Ratio: 25 (ref 12–28)
BUN: 23 mg/dL (ref 8–27)
Bilirubin Total: 0.4 mg/dL (ref 0.0–1.2)
CO2: 23 mmol/L (ref 20–29)
Calcium: 8.5 mg/dL — ABNORMAL LOW (ref 8.7–10.3)
Chloride: 107 mmol/L — ABNORMAL HIGH (ref 96–106)
Creatinine, Ser: 0.92 mg/dL (ref 0.57–1.00)
Globulin, Total: 2.1 g/dL (ref 1.5–4.5)
Glucose: 92 mg/dL (ref 65–99)
Potassium: 4.1 mmol/L (ref 3.5–5.2)
Sodium: 144 mmol/L (ref 134–144)
Total Protein: 6.1 g/dL (ref 6.0–8.5)
eGFR: 67 mL/min/{1.73_m2} (ref >59)

## 2020-11-05 LAB — LIPID PANEL
Chol/HDL Ratio: 2.3 ratio (ref 0.0–4.4)
Cholesterol, Total: 178 mg/dL (ref 100–199)
HDL: 76 mg/dL (ref >39)
LDL Chol Calc (NIH): 93 mg/dL (ref 0–99)
Triglycerides: 46 mg/dL (ref 0–149)
VLDL Cholesterol Cal: 9 mg/dL (ref 5–40)

## 2020-11-09 ENCOUNTER — Other Ambulatory Visit: Payer: Self-pay

## 2020-11-09 ENCOUNTER — Ambulatory Visit (INDEPENDENT_AMBULATORY_CARE_PROVIDER_SITE_OTHER): Payer: PPO | Admitting: Family Medicine

## 2020-11-09 ENCOUNTER — Encounter: Payer: Self-pay | Admitting: Family Medicine

## 2020-11-09 DIAGNOSIS — F418 Other specified anxiety disorders: Secondary | ICD-10-CM

## 2020-11-09 DIAGNOSIS — G4700 Insomnia, unspecified: Secondary | ICD-10-CM

## 2020-11-09 DIAGNOSIS — E785 Hyperlipidemia, unspecified: Secondary | ICD-10-CM

## 2020-11-09 DIAGNOSIS — F439 Reaction to severe stress, unspecified: Secondary | ICD-10-CM | POA: Diagnosis not present

## 2020-11-09 MED ORDER — BELSOMRA 10 MG PO TABS
5.0000 mg | ORAL_TABLET | Freq: Every evening | ORAL | 3 refills | Status: DC | PRN
Start: 2020-11-09 — End: 2021-12-30

## 2020-11-09 MED ORDER — ATORVASTATIN CALCIUM 40 MG PO TABS: 40.0000 mg | ORAL_TABLET | ORAL | 1 refills | Status: DC

## 2020-11-09 MED ORDER — SERTRALINE HCL 50 MG PO TABS: 25.0000 mg | ORAL_TABLET | Freq: Every day | ORAL | 1 refills | Status: DC

## 2020-11-09 NOTE — Patient Instructions (Addendum)
  [facility] Address: [address] [name] [address], [place], [place] [address] Phone: [phone]  Keep follow up with therapist as planned.  No change in Belsomra unless it is NOT covered. Continue statin ever other day.  No med changes at this time.  Let me know if there are questions.    If you have lab work done today you will be contacted with your lab results within the next 2 weeks.  If you have not heard from [name] then please contact us. The fastest way to get your results is to register for My Chart.   IF you received an x-ray today, you will receive an invoice from [facility]. Please contact [facility] at [phone] with questions or concerns regarding your invoice.   IF you received labwork today, you will receive an invoice from [place]. Please contact [facility] at [phone] with questions or concerns regarding your invoice.   Our billing staff will not be able to assist you with questions regarding bills from these companies.  You will be contacted with the lab results as soon as they are available. The fastest way to get your results is to activate your My Chart account. Instructions are located on the last page of this paperwork. If you have not heard from [name] regarding the results in 2 weeks, please contact this office.

## 2020-11-09 NOTE — Progress Notes (Signed)
Subjective:  Patient ID: Erika Johns, female    DOB: [birth year]  Age: 70 y.o. MRN: [id number]  CC:  Chief Complaint  Patient presents with   Follow-up    On Cholesterol medication, Belsoma and Zoloft prior to being away from [facility] for 3 and 1/2 months. Pt has a letter from her insurance stating that they don't want to cover her [place]. Pt is supplementing with melatonin, but the pt want to know if their was another medication . Pt was advised that we can do a PA  to keep it covered. Pt would like to ask if she needed to consult with him to have nerve conduction done.    HPI [name] presents for   Insomnia Has been doing well with use of Belsomra but some question on coverage, may need prior authorization - QL of 30 over 30 days.   Last filled #10 of 10mg  on 10/02/20 - has been taking 1/2 pill 1-2 times per week.  Has been trying to use melatonin nightly. Not as good. No new side effects on current dose.   Planning on going to [place] in [place] April, then [place] for few months.   Upper airway cough syndrome Treated by Huston Foley 08/11/20. Albuterol rx, fluticasone and Zyrtec. Doing better - no need for these meds lately.  Depression: With anxiety, treated with sertraline 25 mg every morning.  Has been treated up to 50 mg at times with increased stress.  Rare Klonopin few times per month when last discussed in September..Controlled substance database (PDMP) reviewed. No concerns appreciated. Last filled 10/10/19.  Usually on 1/2 pill, sometimes full pill but rare of zoloft.  Klonopin 2 times per month. Has some at home.  Mom still living, and stress with sister.  Has been able to set some boundaries.  Meeting with new therapist Carlyon Prows at [facility] - (prior Vivia Budge). Cost effective.    Depression screen [facility] 2/9 11/09/2020 08/11/2020 04/30/2020 03/10/2020 10/10/2019  Decreased Interest 0 0 0 0 0  Down, Depressed, Hopeless 0 0 0 0 0  PHQ -  2 Score 0 0 0 0 0  Altered sleeping - - - - -  Tired, decreased energy - - - - -  Change in appetite - - - - -  Feeling bad or failure about yourself  - - - - -  Trouble concentrating - - - - -  Moving slowly or fidgety/restless - - - - -  Suicidal thoughts - - - - -  PHQ-9 Score - - - - -  Difficult doing work/chores - - - - -  No flowsheet data found.  Hyperlipidemia: Lipitor 40 mg daily.  Recent labs, overall stable lipids. No new myalgias. Followed by ortho for knee issues - Dr. Ronnie Derby.referring for neuropathy in feet.  Lab Results  Component Value Date   CHOL 178 11/04/2020   HDL 76 11/04/2020   LDLCALC 93 11/04/2020   TRIG 46 11/04/2020   CHOLHDL 2.3 11/04/2020   Lab Results  Component Value Date   ALT 25 11/04/2020   AST 25 11/04/2020   ALKPHOS 55 11/04/2020   BILITOT 0.4 11/04/2020   Followed by Dr. Layne Benton for osteoporosis. On calcium supplement and 2000iu vit D. prolia osteoporosis.  Had some falls in January - no fractures. Doing ok now.      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/03/2017   Achilles tendon contracture, right 05/03/2017   Eustachian tube dysfunction, left 04/04/2017   Sensorineural hearing loss (SNHL), bilateral 04/04/2017   Constipation 01/09/2014   Osteoarthritis of left knee 08/27/2009   LUMBAGO 08/27/2009   TROCHANTERIC BURSITIS 08/27/2009   HAND PAIN, LEFT 08/27/2009   Past Medical History:  Diagnosis Date   Allergy    Anxiety    Arthritis    Constipation, chronic    Depression    Hyperlipidemia    Phreesia 03/07/2020   IBS (irritable bowel syndrome)    Insomnia    PONV (postoperative nausea and vomiting)    severe n/v-needs scop   Past Surgical History:  Procedure Laterality Date   COLONOSCOPY     DILATION AND CURETTAGE OF  UTERUS  2006   [place]   fx tib-fib-rt   HARDWARE REMOVAL  1996   rt ankle   KNEE ARTHROSCOPY  2003   lt   KNEE ARTHROSCOPY     right   ORIF CLAVICULAR FRACTURE Right 05/28/2013   Procedure: OPEN REDUCTION INTERNAL FIXATION (ORIF) CLAVICULAR FRACTURE;  Surgeon: Renette Butters, MD;  Location: [facility];  Service: Orthopedics;  Laterality: Right;   TONSILLECTOMY     Allergies  Allergen Reactions   Other Rash   Effexor [Venlafaxine Hydrochloride] Other (See Comments)    dizzy   Hydrocodone Nausea And Vomiting   Ampicillin Rash   Prior to Admission medications   Medication Sig Start Date End Date Taking? Authorizing Provider  albuterol (VENTOLIN HFA) 108 (90 Base) MCG/ACT inhaler Inhale 1-2 puffs into the lungs every 4 (four) hours as needed for wheezing or shortness of breath. 08/11/20  Yes Just, Laurita Quint, FNP  Ascorbic Acid (VITAMIN C) 1000 MG tablet Take 1,000 mg by mouth daily.   Yes [provider]  atorvastatin (LIPITOR) 40 MG tablet Take 1 tablet (40 mg total) by mouth every other day. 04/30/20  Yes [name], MD  cetirizine (ZYRTEC) 10 MG tablet Take 1 tablet (10 mg total) by mouth at bedtime. 08/11/20  Yes Just, Laurita Quint, FNP  Cholecalciferol (VITAMIN D3) 1000 UNITS CAPS Take by mouth daily.   Yes [provider]  clonazePAM (KLONOPIN) 0.5 MG tablet Take 0.5-1 tablets (0.25-0.5 mg total) by mouth 2 (two) times daily as needed for anxiety. 10/10/19  Yes [name], MD  denosumab (PROLIA) 60 MG/ML SOSY injection Inject 60 mg into the skin every 6 (six) months.   Yes [provider]  diclofenac sodium (VOLTAREN) 1 % GEL Apply 2 g topically 4 (four) times daily as needed (left knee pain). 12/25/17  Yes Everrett Coombe, MD  fluticasone (FLONASE) 50 MCG/ACT nasal spray SPRAY 2 SPRAYS INTO EACH NOSTRIL EVERY DAY 08/11/20  Yes Just, Laurita Quint, FNP  glucosamine-chondroitin 500-400 MG tablet Take 1 tablet by mouth.  2 gel caps at breakfast   Yes [provider]  halobetasol (ULTRAVATE) 0.05 % cream Apply topically 2 (two) times daily. 06/05/18  Yes [name], MD  hydrocortisone (ANUSOL-HC) 2.5 % rectal cream Place 1 application rectally 2 (two) times daily. As needed short term for itching 11/05/19  Yes [name], MD  OVER THE COUNTER MEDICATION    Yes [provider]  sertraline (ZOLOFT) 50 MG tablet TAKE 0.5-1 TABLETS (25-50 MG TOTAL) BY MOUTH DAILY. 10/31/20  Yes [name], MD  Suvorexant [facility]) 10  MG TABS Take 10-20 mg by mouth at bedtime as needed. 07/07/20  Yes [name], MD  tamsulosin (FLOMAX) 0.4 MG CAPS capsule Take 0.4 mg by mouth.   Yes [provider]  UNABLE TO FIND Med Name: [facility]   Yes [provider]  UNABLE TO FIND Med Name: Rocco Pauls [provider]  vitamin k 100 MCG tablet Take 100 mcg by mouth daily.   Yes [provider]   Social History   Socioeconomic History   Marital status: Widowed    Spouse name: Not on file   Number of children: Not on file   Years of education: Not on file   Highest education level: Not on file  Occupational History   Not on file  Tobacco Use   Smoking status: Never Smoker   Smokeless tobacco: Never Used  Substance and Sexual Activity   Alcohol use: Yes    Alcohol/week: 1.0 standard drink    Types: 1 Glasses of wine per week   Drug use: No   Sexual activity: Yes  Other Topics Concern   Not on file  Social History Narrative   Not on file   Social Determinants of Health   Financial Resource Strain: Not on file  Food Insecurity: Not on file  Transportation Needs: Not on file  Physical Activity: Not on file  Stress: Not on file  Social Connections: Not on file  Intimate Partner Violence: Not on file    Review of Systems  Constitutional: Negative for fatigue and unexpected weight change.  Respiratory: Negative for chest tightness and  shortness of breath.   Cardiovascular: Negative for chest pain, palpitations and leg swelling.  Gastrointestinal: Negative for abdominal pain and blood in stool.  Neurological: Negative for dizziness, syncope, light-headedness and headaches.     Objective:   Vitals:   11/09/20 1526  BP: 106/67  Pulse: 69  Temp: 97.6 F (36.4 C)  TempSrc: Temporal  Weight: 149 lb (67.6 kg)  Height: 5' 10.75" (1.797 m)     Physical Exam Vitals reviewed.  Constitutional:      Appearance: She is well-developed.  HENT:     Head: Normocephalic and atraumatic.  Eyes:     Conjunctiva/sclera: Conjunctivae normal.     Pupils: Pupils are equal, round, and reactive to light.  Neck:     Vascular: No carotid bruit.  Cardiovascular:     Rate and Rhythm: Normal rate and regular rhythm.     Heart sounds: Normal heart sounds.  Pulmonary:     Effort: Pulmonary effort is normal.     Breath sounds: Normal breath sounds.  Abdominal:     Palpations: Abdomen is soft. There is no pulsatile mass.     Tenderness: There is no abdominal tenderness.  Skin:    General: Skin is warm and dry.  Neurological:     Mental Status: She is alert and oriented to person, place, and time.  Psychiatric:        Mood and Affect: Mood normal.        Behavior: Behavior normal.        Thought Content: Thought content normal.        Judgment: Judgment normal.        Assessment & Plan:  CIDNEY KIRKWOOD is a 70 y.o. female . Hyperlipidemia, unspecified hyperlipidemia type - Plan: atorvastatin (LIPITOR) 40 MG tablet  - recent labs noted. No changes - continue QOD dosing lipitor.   Depression with anxiety -  Plan: sertraline (ZOLOFT) 50 MG tablet Situational stress - Plan: sertraline (ZOLOFT) 50 MG tablet  - overall stable. Options for zoloft dosing reviewed, but remain on med for now. Rare need for klonopin - continue. Continue follow up with new therapist.   Insomnia, unspecified type - Plan: Suvorexant  (BELSOMRA) 10 MG TABS  - stable with Belsomra, and on my review - appears to be quantity limit, but  authorized. Will change to #30 for easier refills and with upcoming travel.   Meds ordered this encounter  Medications   atorvastatin (LIPITOR) 40 MG tablet    Sig: Take 1 tablet (40 mg total) by mouth every other day.    Dispense:  45 tablet    Refill:  1    Refill [id number]   sertraline (ZOLOFT) 50 MG tablet    Sig: Take 0.5-1 tablets (25-50 mg total) by mouth daily.    Dispense:  90 tablet    Refill:  1   Suvorexant (BELSOMRA) 10 MG TABS    Sig: Take 5-10 mg by mouth at bedtime as needed.    Dispense:  30 tablet    Refill:  3   Patient Instructions    [facility] Address: [name] [place], [place], [place] [address] Phone: [phone]  Keep follow up with therapist as planned.  No change in Belsomra unless it is NOT covered. Continue statin ever other day.  No med changes at this time.  Let me know if there are questions.    If you have lab work done today you will be contacted with your lab results within the next 2 weeks.  If you have not heard from [name] then please contact us. The fastest way to get your results is to register for My Chart.   IF you received an x-ray today, you will receive an invoice from [facility]. Please contact [facility] at [phone] with questions or concerns regarding your invoice.   IF you received labwork today, you will receive an invoice from [place]. Please contact [facility] at [phone] with questions or concerns regarding your invoice.   Our billing staff will not be able to assist you with questions regarding bills from these companies.  You will be contacted with the lab results as soon as they are available. The fastest way to get your results is to activate your My Chart account. Instructions are located on the last page of this paperwork. If you have not heard from [name] regarding the results  in 2 weeks, please contact this office.         Signed, Merri Ray, MD [facility]

## 2020-11-10 ENCOUNTER — Encounter: Payer: Self-pay | Admitting: Family Medicine

## 2020-11-19 DIAGNOSIS — M25571 Pain in right ankle and joints of right foot: Secondary | ICD-10-CM | POA: Diagnosis not present

## 2020-11-19 DIAGNOSIS — G589 Mononeuropathy, unspecified: Secondary | ICD-10-CM | POA: Diagnosis not present

## 2020-12-01 ENCOUNTER — Encounter: Payer: Self-pay | Admitting: Neurology

## 2020-12-01 ENCOUNTER — Other Ambulatory Visit: Payer: Self-pay

## 2020-12-01 DIAGNOSIS — R202 Paresthesia of skin: Secondary | ICD-10-CM

## 2020-12-05 DIAGNOSIS — Z20822 Contact with and (suspected) exposure to covid-19: Secondary | ICD-10-CM | POA: Diagnosis not present

## 2020-12-14 DIAGNOSIS — R509 Fever, unspecified: Secondary | ICD-10-CM | POA: Diagnosis not present

## 2021-01-14 DIAGNOSIS — R69 Illness, unspecified: Secondary | ICD-10-CM | POA: Diagnosis not present

## 2021-03-17 ENCOUNTER — Encounter: Payer: PPO | Admitting: Neurology

## 2021-03-31 DIAGNOSIS — H04123 Dry eye syndrome of bilateral lacrimal glands: Secondary | ICD-10-CM | POA: Diagnosis not present

## 2021-03-31 DIAGNOSIS — H2512 Age-related nuclear cataract, left eye: Secondary | ICD-10-CM | POA: Diagnosis not present

## 2021-03-31 DIAGNOSIS — H35373 Puckering of macula, bilateral: Secondary | ICD-10-CM | POA: Diagnosis not present

## 2021-03-31 DIAGNOSIS — H353131 Nonexudative age-related macular degeneration, bilateral, early dry stage: Secondary | ICD-10-CM | POA: Diagnosis not present

## 2021-03-31 DIAGNOSIS — Z961 Presence of intraocular lens: Secondary | ICD-10-CM | POA: Diagnosis not present

## 2021-04-02 DIAGNOSIS — M2351 Chronic instability of knee, right knee: Secondary | ICD-10-CM | POA: Diagnosis not present

## 2021-04-02 DIAGNOSIS — M1711 Unilateral primary osteoarthritis, right knee: Secondary | ICD-10-CM | POA: Diagnosis not present

## 2021-04-05 ENCOUNTER — Other Ambulatory Visit: Payer: Self-pay | Admitting: Orthopedic Surgery

## 2021-04-05 DIAGNOSIS — G8929 Other chronic pain: Secondary | ICD-10-CM

## 2021-04-05 DIAGNOSIS — M2351 Chronic instability of knee, right knee: Secondary | ICD-10-CM

## 2021-04-06 ENCOUNTER — Other Ambulatory Visit: Payer: Self-pay

## 2021-04-06 ENCOUNTER — Ambulatory Visit: Payer: PPO | Admitting: Neurology

## 2021-04-06 DIAGNOSIS — R202 Paresthesia of skin: Secondary | ICD-10-CM | POA: Diagnosis not present

## 2021-04-06 NOTE — Procedures (Signed)
[facility]  [place], [place]  [place], [place] [address] Tel: [phone] Fax:  [phone] Test Date:  04/06/2021  Patient: Erika Johns DOB: 1951/03/20 Physician: Narda Amber, DO  Sex: Female Height: '5\' 10"'$  Ref Phys: Brett Albino  ID#: [id number]   Technician:    Patient Complaints: This is a 69 year old female referred for evaluation of bilateral feet numbness.  NCV & EMG Findings: Electrodiagnostic testing of the right lower extremity and additional studies of the left shows: Bilateral sural and superficial peroneal sensory responses are within normal limits. Bilateral peroneal (EBD) are reduced, and normal at the tibialis anterior.  In isolation, these findings are normal for patient's age.  Tibial motor responses are within normal limits. Bilateral tibial H reflex studies are within normal limits. There is no evidence of active or chronic motor axonal changes affecting any of the tested muscles.  Motor unit configuration and recruitment pattern is within normal limits.  Impression: This is a normal study of the lower extremities.  In particular, there is no evidence of a sensorimotor polyneuropathy or lumbosacral radiculopathy.    ___________________________ Narda Amber, DO    Nerve Conduction Studies Anti Sensory Summary Table   Stim Site NR Peak (ms) Norm Peak (ms) P-T Amp (V) Norm P-T Amp  Left Sup Peroneal Anti Sensory (Ant Lat Mall)  31C  12 cm    2.3 <4.6 7.4 >3  Right Sup Peroneal Anti Sensory (Ant Lat Mall)  31C  12 cm    2.6 <4.6 8.7 >3  Left Sural Anti Sensory (Lat Mall)  31C  Calf    3.4 <4.6 7.8 >3  Right Sural Anti Sensory (Lat Mall)  31C  Calf    3.5 <4.6 6.1 >3   Motor Summary Table   Stim Site NR Onset (ms) Norm Onset (ms) O-P Amp (mV) Norm O-P Amp Site1 Site2 Delta-0 (ms) Dist (cm) Vel (m/s) Norm Vel (m/s)  Left Peroneal Motor (Ext Dig Brev)  31C  Ankle    5.0 <6.0 2.0 >2.5 B Fib Ankle 9.6 39.0  41 >40  B Fib    14.6  1.9  Poplt B Fib 2.0 8.0 40 >40  Poplt    16.6  1.8         Right Peroneal Motor (Ext Dig Brev)  31C  Ankle    4.5 <6.0 1.1 >2.5 B Fib Ankle 10.2 41.0 40 >40  B Fib    14.7  1.0  Poplt B Fib 1.8 8.0 44 >40  Poplt    16.5  0.4         Left Peroneal TA Motor (Tib Ant)  31C  Fib Head    3.1 <4.5 4.4 >3 Poplit Fib Head 1.6 8.0 50 >40  Poplit    4.7  4.1         Right Peroneal TA Motor (Tib Ant)  31C  Fib Head    1.9 <4.5 5.0 >3 Poplit Fib Head 1.7 8.0 47 >40  Poplit    3.6  4.1         Left Tibial Motor (Abd Hall Brev)  31C  Ankle    5.5 <6.0 8.0 >4 Knee Ankle 10.2 42.0 41 >40  Knee    15.7  6.4         Right Tibial Motor (Abd Hall Brev)  31C  Ankle    5.3 <6.0 8.6 >4 Knee Ankle 9.9 43.0 43 >40  Knee    15.2  4.4  H Reflex Studies   NR H-Lat (ms) Lat Norm (ms) L-R H-Lat (ms)  Left Tibial (Gastroc)  31C     34.69 <35 0.82  Right Tibial (Gastroc)  31C     33.88 <35 0.82   EMG   Side Muscle Ins Act Fibs Psw Fasc Number Recrt Dur Dur. Amp Amp. Poly Poly. Comment  Left Flex Dig Long Nml Nml Nml Nml Nml Nml Nml Nml Nml Nml Nml Nml N/A  Left AntTibialis Nml Nml Nml Nml Nml Nml Nml Nml Nml Nml Nml Nml N/A  Left Gastroc Nml Nml Nml Nml Nml Nml Nml Nml Nml Nml Nml Nml N/A  Left BicepsFemS Nml Nml Nml Nml Nml Nml Nml Nml Nml Nml Nml Nml N/A  Left RectFemoris Nml Nml Nml Nml Nml Nml Nml Nml Nml Nml Nml Nml N/A  Left AbdHallucis Nml Nml Nml Nml Nml Nml Nml Nml Nml Nml Nml Nml N/A  Right AntTibialis Nml Nml Nml Nml Nml Nml Nml Nml Nml Nml Nml Nml N/A  Right RectFemoris Nml Nml Nml Nml Nml Nml Nml Nml Nml Nml Nml Nml N/A  Right Flex Dig Long Nml Nml Nml Nml Nml Nml Nml Nml Nml Nml Nml Nml N/A  Right AbdHallucis Nml Nml Nml Nml Nml Nml Nml Nml Nml Nml Nml Nml N/A  Right Gastroc Nml Nml Nml Nml Nml Nml Nml Nml Nml Nml Nml Nml N/A  Right BicepsFemS Nml Nml Nml Nml Nml Nml Nml Nml Nml Nml Nml Nml N/A      Waveforms:

## 2021-04-16 ENCOUNTER — Other Ambulatory Visit: Payer: Self-pay

## 2021-04-16 ENCOUNTER — Ambulatory Visit
Admission: RE | Admit: 2021-04-16 | Discharge: 2021-04-16 | Disposition: A | Discharge status: Home / Self Care | Payer: PPO | Source: Ambulatory Visit | Attending: Orthopedic Surgery | Admitting: Orthopedic Surgery

## 2021-04-16 DIAGNOSIS — M25561 Pain in right knee: Secondary | ICD-10-CM | POA: Diagnosis not present

## 2021-04-16 DIAGNOSIS — M2351 Chronic instability of knee, right knee: Secondary | ICD-10-CM

## 2021-04-16 DIAGNOSIS — G8929 Other chronic pain: Secondary | ICD-10-CM

## 2021-04-20 DIAGNOSIS — E559 Vitamin D deficiency, unspecified: Secondary | ICD-10-CM | POA: Diagnosis not present

## 2021-04-20 DIAGNOSIS — R5383 Other fatigue: Secondary | ICD-10-CM | POA: Diagnosis not present

## 2021-04-20 DIAGNOSIS — M810 Age-related osteoporosis without current pathological fracture: Secondary | ICD-10-CM | POA: Diagnosis not present

## 2021-04-22 DIAGNOSIS — M1711 Unilateral primary osteoarthritis, right knee: Secondary | ICD-10-CM | POA: Diagnosis not present

## 2021-04-27 DIAGNOSIS — E559 Vitamin D deficiency, unspecified: Secondary | ICD-10-CM | POA: Diagnosis not present

## 2021-04-27 DIAGNOSIS — M810 Age-related osteoporosis without current pathological fracture: Secondary | ICD-10-CM | POA: Diagnosis not present

## 2021-04-29 DIAGNOSIS — Z1231 Encounter for screening mammogram for malignant neoplasm of breast: Secondary | ICD-10-CM | POA: Diagnosis not present

## 2021-05-02 ENCOUNTER — Other Ambulatory Visit: Payer: Self-pay | Admitting: Family Medicine

## 2021-05-02 DIAGNOSIS — E785 Hyperlipidemia, unspecified: Secondary | ICD-10-CM

## 2021-05-28 DIAGNOSIS — R3914 Feeling of incomplete bladder emptying: Secondary | ICD-10-CM | POA: Diagnosis not present

## 2021-07-08 ENCOUNTER — Encounter: Payer: Self-pay | Admitting: Family Medicine

## 2021-07-08 ENCOUNTER — Telehealth (INDEPENDENT_AMBULATORY_CARE_PROVIDER_SITE_OTHER): Payer: PPO | Admitting: Family Medicine

## 2021-07-08 DIAGNOSIS — G4700 Insomnia, unspecified: Secondary | ICD-10-CM | POA: Diagnosis not present

## 2021-07-08 DIAGNOSIS — R058 Other specified cough: Secondary | ICD-10-CM | POA: Diagnosis not present

## 2021-07-08 DIAGNOSIS — F418 Other specified anxiety disorders: Secondary | ICD-10-CM

## 2021-07-08 DIAGNOSIS — E785 Hyperlipidemia, unspecified: Secondary | ICD-10-CM | POA: Diagnosis not present

## 2021-07-08 DIAGNOSIS — F439 Reaction to severe stress, unspecified: Secondary | ICD-10-CM

## 2021-07-08 MED ORDER — ATORVASTATIN CALCIUM 40 MG PO TABS: 40.0000 mg | ORAL_TABLET | ORAL | 1 refills | Status: DC

## 2021-07-08 MED ORDER — SERTRALINE HCL 50 MG PO TABS: 25.0000 mg | ORAL_TABLET | Freq: Every day | ORAL | 1 refills | Status: DC

## 2021-07-08 NOTE — Progress Notes (Signed)
Virtual Visit via Video Note  I connected with Erika Johns on 07/08/21 at 5:22 PM by a video enabled telemedicine application and verified that I am speaking with the correct person using two identifiers.  Patient location:home My location: office - [place]    I discussed the limitations, risks, security and privacy concerns of performing an evaluation and management service by telephone and the availability of in person appointments. I also discussed with the patient that there may be a patient responsible charge related to this service. The patient expressed understanding and agreed to proceed, consent obtained  Chief complaint:  Chief Complaint  Patient presents with   Depression    Pt would like refill zoloft, taking trip to [place], pt likes dose as is no concerns at this time    Hyperlipidemia    Pt due for recheck and refill for while she is gone in [place] through the new year but will come in for labs      History of Present Illness: Erika Johns is a 70 y.o. female  Hyperlipidemia: Lipitor 40mg  QOD. No new myalgias.  Has regular bloodwork with Dr. Layne Benton for Prolia for osteoporosis, no recent lipids.   Constitutional: Negative for fatigue and unexpected weight change.  Eyes: Negative for visual disturbance.  Respiratory: Negative for cough, chest tightness and shortness of breath.   Cardiovascular: Negative for chest pain, palpitations and leg swelling.  Gastrointestinal: Negative for abdominal pain and blood in stool.  Neurological: Negative for dizziness, light-headedness and headaches.     Lab Results  Component Value Date   CHOL 178 11/04/2020   HDL 76 11/04/2020   LDLCALC 93 11/04/2020   TRIG 46 11/04/2020   CHOLHDL 2.3 11/04/2020   Lab Results  Component Value Date   ALT 25 11/04/2020   AST 25 11/04/2020   ALKPHOS 55 11/04/2020   BILITOT 0.4 11/04/2020   Upper Airway cough syndrome Rx for albuterol, flonase, zyrtec - has  not needed in some time - over 6 months.  Depression: Treated with zoloft 25mg  qd (1/2 of 50mg ). No need for higher dose.  Mother (103yo) and sister with health issues - she has been able to help with their health issues. Trying to eat well, exercise.  Therapist Carlyon Prows at [facility] - visit today - went well.  Multiple trips past year, lots of driving. Drove sister to visit other sister in [place]. Plan to fly to [place] in mid December through January.  Still with Klonopin occasionally only - 1/2- 1/4 pill prior to meeting recently, 1-2 times per month.  Melatonin, CBD gummies at times usually. 1/2 Belsomra 2 times per week - works well. No refill needed.   Biking for exercise 25 miles max. 100 miles this month Walking, hiking.  Followed by Dr. Ronnie Derby for nee issues. Prior cortisone shots. Plan for gel shot.   Has not need anusol or ultravate recently.  Followed by urology, Dr. Matilde Sprang, takes FLomax 0.8mg  for urinary issues.   Depression screen [facility] 2/9 07/08/2021 11/09/2020 08/11/2020 04/30/2020 03/10/2020  Decreased Interest 0 0 0 0 0  Down, Depressed, Hopeless 1 0 0 0 0  PHQ - 2 Score 1 0 0 0 0  Altered sleeping 1 - - - -  Tired, decreased energy 0 - - - -  Change in appetite 0 - - - -  Feeling bad or failure about yourself  0 - - - -  Trouble concentrating 0 - - - -  Moving slowly or  fidgety/restless 0 - - - -  Suicidal thoughts 0 - - - -  PHQ-9 Score 2 - - - -  Difficult doing work/chores - - - - -       Patient Active Problem List   Diagnosis Date Noted   Bilateral hearing loss 05/20/2019   Bilateral impacted cerumen 05/20/2019   Pain in joint, ankle and foot 12/25/2017   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/03/2017   Achilles tendon contracture, right 05/03/2017   Eustachian tube dysfunction, left 04/04/2017   Sensorineural hearing loss (SNHL), bilateral 04/04/2017   Constipation 01/09/2014   Osteoarthritis of left knee 08/27/2009   LUMBAGO 08/27/2009    TROCHANTERIC BURSITIS 08/27/2009   HAND PAIN, LEFT 08/27/2009   Past Medical History:  Diagnosis Date   Allergy    Anxiety    Arthritis    Constipation, chronic    Depression    Hyperlipidemia    Phreesia 03/07/2020   IBS (irritable bowel syndrome)    Insomnia    PONV (postoperative nausea and vomiting)    severe n/v-needs scop   Past Surgical History:  Procedure Laterality Date   COLONOSCOPY     DILATION AND CURETTAGE OF UTERUS  2006   [place]   fx tib-fib-rt   HARDWARE REMOVAL  1996   rt ankle   KNEE ARTHROSCOPY  2003   lt   KNEE ARTHROSCOPY     right   ORIF CLAVICULAR FRACTURE Right 05/28/2013   Procedure: OPEN REDUCTION INTERNAL FIXATION (ORIF) CLAVICULAR FRACTURE;  Surgeon: Renette Butters, MD;  Location: [facility];  Service: Orthopedics;  Laterality: Right;   TONSILLECTOMY     Allergies  Allergen Reactions   Other Rash   Effexor [Venlafaxine Hydrochloride] Other (See Comments)    dizzy   Hydrocodone Nausea And Vomiting   Ampicillin Rash   Prior to Admission medications   Medication Sig Start Date End Date Taking? Authorizing Provider  albuterol (VENTOLIN HFA) 108 (90 Base) MCG/ACT inhaler Inhale 1-2 puffs into the lungs every 4 (four) hours as needed for wheezing or shortness of breath. 08/11/20   Just, Laurita Quint, FNP  Ascorbic Acid (VITAMIN C) 1000 MG tablet Take 1,000 mg by mouth daily.    [provider]  atorvastatin (LIPITOR) 40 MG tablet TAKE 1 TABLET BY MOUTH EVERY OTHER DAY 05/03/21   [name], MD  cetirizine (ZYRTEC) 10 MG tablet Take 1 tablet (10 mg total) by mouth at bedtime. 08/11/20   Just, Laurita Quint, FNP  Cholecalciferol (VITAMIN D3) 1000 UNITS CAPS Take by mouth daily.    [provider]  clonazePAM (KLONOPIN) 0.5 MG tablet Take 0.5-1 tablets (0.25-0.5 mg total) by mouth 2 (two) times daily as needed for anxiety. 10/10/19   [name], MD  denosumab (PROLIA) 60 MG/ML SOSY injection  Inject 60 mg into the skin every 6 (six) months.    [provider]  diclofenac sodium (VOLTAREN) 1 % GEL Apply 2 g topically 4 (four) times daily as needed (left knee pain). 12/25/17   Everrett Coombe, MD  fluticasone (FLONASE) 50 MCG/ACT nasal spray SPRAY 2 SPRAYS INTO EACH NOSTRIL EVERY DAY 08/11/20   Just, Laurita Quint, FNP  glucosamine-chondroitin 500-400 MG tablet Take 1 tablet by mouth. 2 gel caps at breakfast    [provider]  halobetasol (ULTRAVATE) 0.05 % cream Apply topically 2 (two) times daily. 06/05/18   [name], MD  hydrocortisone (ANUSOL-HC) 2.5 % rectal cream Place 1  application rectally 2 (two) times daily. As needed short term for itching 11/05/19   [name], MD  OVER THE COUNTER MEDICATION     [provider]  sertraline (ZOLOFT) 50 MG tablet Take 0.5-1 tablets (25-50 mg total) by mouth daily. 11/09/20   [name], MD  Suvorexant (BELSOMRA) 10 MG TABS Take 5-10 mg by mouth at bedtime as needed. 11/09/20   [name], MD  tamsulosin (FLOMAX) 0.4 MG CAPS capsule Take 0.4 mg by mouth.    [provider]  UNABLE TO FIND Med Name: [facility]    [provider]  UNABLE TO FIND Med Name: [facility]    [provider]  vitamin k 100 MCG tablet Take 100 mcg by mouth daily.    [provider]   Social History   Socioeconomic History   Marital status: Widowed    Spouse name: Not on file   Number of children: Not on file   Years of education: Not on file   Highest education level: Not on file  Occupational History   Not on file  Tobacco Use   Smoking status: Never   Smokeless tobacco: Never  Substance and Sexual Activity   Alcohol use: Yes    Alcohol/week: 1.0 standard drink    Types: 1 Glasses of wine per week   Drug use: No   Sexual activity: Yes  Other Topics Concern   Not on file  Social History Narrative   Not on file   Social Determinants of Health   Financial Resource Strain: Not on  file  Food Insecurity: Not on file  Transportation Needs: Not on file  Physical Activity: Not on file  Stress: Not on file  Social Connections: Not on file  Intimate Partner Violence: Not on file    Observations/Objective: There were no vitals filed for this visit. Weight 146.  Nontoxic appearance on video.  Euthymic mood.  Affect mood congruent.  Normal respiratory effort.  No distress.  Assessment and Plan: Hyperlipidemia, unspecified hyperlipidemia type - Plan: atorvastatin (LIPITOR) 40 MG tablet, Lipid panel  -Tolerating every other day dosing of Lipitor, continue same, plan for lipid panel -fasting labs to be drawn at lab.  Noted recent visit in August with Dr. Layne Benton, reported normal CMP at that time.   -Follow-up as planned in March.  Depression with anxiety - Plan: sertraline (ZOLOFT) 50 MG tablet Situational stress - Plan: sertraline (ZOLOFT) 50 MG tablet Insomnia, unspecified type  -Stable with 25 mg sertraline dosing, option of 50 mg.  Continue counseling.  Rare need for clonazepam,  episodic Belsomra has been effective for sleep when needed, or not effective with melatonin or CBD.  Upper airway cough syndrome  -No recent symptoms, has meds available if needed.   Follow Up Instructions: As scheduled in March.   I discussed the assessment and treatment plan with the patient. The patient was provided an opportunity to ask questions and all were answered. The patient agreed with the plan and demonstrated an understanding of the instructions.   The patient was advised to call back or seek an in-person evaluation if the symptoms worsen or if the condition fails to improve as anticipated.   [name], MD

## 2021-07-13 ENCOUNTER — Other Ambulatory Visit (INDEPENDENT_AMBULATORY_CARE_PROVIDER_SITE_OTHER): Payer: PPO

## 2021-07-13 DIAGNOSIS — E785 Hyperlipidemia, unspecified: Secondary | ICD-10-CM

## 2021-07-13 LAB — LIPID PANEL
Cholesterol: 157 mg/dL (ref 0–200)
HDL: 65.9 mg/dL (ref >39.00)
LDL Cholesterol: 74 mg/dL (ref 0–99)
NonHDL: 90.6
Total CHOL/HDL Ratio: 2
Triglycerides: 82 mg/dL (ref 0.0–149.0)
VLDL: 16.4 mg/dL (ref 0.0–40.0)

## 2021-07-27 DIAGNOSIS — M1711 Unilateral primary osteoarthritis, right knee: Secondary | ICD-10-CM | POA: Diagnosis not present

## 2021-09-27 ENCOUNTER — Telehealth (INDEPENDENT_AMBULATORY_CARE_PROVIDER_SITE_OTHER): Payer: PPO | Admitting: Physician Assistant

## 2021-09-27 ENCOUNTER — Encounter: Payer: Self-pay | Admitting: Physician Assistant

## 2021-09-27 VITALS — Temp 99.9°F | Ht 70.75 in | Wt 146.0 lb

## 2021-09-27 DIAGNOSIS — U071 COVID-19: Secondary | ICD-10-CM | POA: Diagnosis not present

## 2021-09-27 DIAGNOSIS — Z20822 Contact with and (suspected) exposure to covid-19: Secondary | ICD-10-CM | POA: Diagnosis not present

## 2021-09-27 DIAGNOSIS — Z20828 Contact with and (suspected) exposure to other viral communicable diseases: Secondary | ICD-10-CM | POA: Diagnosis not present

## 2021-09-27 MED ORDER — NIRMATRELVIR/RITONAVIR (PAXLOVID)TABLET: 3.0000 | ORAL_TABLET | Freq: Two times a day (BID) | ORAL | 0 refills | Status: AC

## 2021-09-27 NOTE — Progress Notes (Signed)
° °  Virtual Visit via Video Note  I connected with  Erika Johns  on 09/27/21 at 12:00 PM EST by a video enabled telemedicine application and verified that I am speaking with the correct person using two identifiers.  Location: Patient: home Provider: Therapist, music at [place] present: Patient and myself   I discussed the limitations of evaluation and management by telemedicine and the availability of in person appointments. The patient expressed understanding and agreed to proceed.   History of Present Illness:  Chief complaint: COVID-19 positive today at [facility]  Symptom onset: 09/26/21 Pertinent positives: Fever (Tmax 100.4 F), postnasal drip, body aches, headache, scratchy throat Pertinent negatives: N/V/D, abd pain, dizziness, weakness, dec urination Treatments tried: Garlic, sage, thyme salt gargles; Tylenol; Flonase Vaccine status: UTD on COVID-19 vaccines  Sick exposure: Recent travel to [place]   Negative for flu A & B at [facility] this morning.   Observations/Objective:   Gen: Awake, alert, no acute distress, some congestion noted Resp: Breathing is even and non-labored Psych: calm/pleasant demeanor Neuro: Alert and Oriented x 3, + facial symmetry, speech is clear.   Assessment and Plan:  1. COVID-19 Diagnosis confirmed via test today at [facility]  Patient is currently having mild symptoms.  We discussed current algorithm recommendations for prescribing outpatient antivirals.  As the patient is over the age of 31 and within 5 day window onset of symptoms, she could benefit from antiviral treatment.  Risks versus benefits discussed. Will start Paxlovid, possible SE discussed. More than 12 hrs since last statin dose, knows to hold this medication. Last GFR normal.  Advised self-isolation at home for the next 5 days and then masking around others for at least an additional 5 days.  Treat supportively at this time including  sleeping prone, deep breathing exercises, pushing fluids, walking every few hours, vitamins C and D, and Tylenol or ibuprofen as needed.  The patient understands that COVID-19 illness can wax and wane.  Should the symptoms acutely worsen or patient starts to experience sudden shortness of breath, chest pain, severe weakness, the patient will go straight to the emergency department.  Also advised home pulse oximetry monitoring and for any reading consistently under 92%, should also report to the emergency department.  The patient will continue to keep [name] updated.   Follow Up Instructions:    I discussed the assessment and treatment plan with the patient. The patient was provided an opportunity to ask questions and all were answered. The patient agreed with the plan and demonstrated an understanding of the instructions.   The patient was advised to call back or seek an in-person evaluation if the symptoms worsen or if the condition fails to improve as anticipated.  Baily Hovanec M Camesha Farooq, PA-C

## 2021-09-27 NOTE — Patient Instructions (Signed)
Hold your cholesterol medication until your course of Paxlovid is completed.

## 2021-10-06 ENCOUNTER — Telehealth (INDEPENDENT_AMBULATORY_CARE_PROVIDER_SITE_OTHER): Payer: PPO | Admitting: Physician Assistant

## 2021-10-06 VITALS — Temp 97.5°F | Ht 70.75 in

## 2021-10-06 DIAGNOSIS — U071 COVID-19: Secondary | ICD-10-CM

## 2021-10-06 MED ORDER — MOLNUPIRAVIR EUA 200MG CAPSULE: 4.0000 | ORAL_CAPSULE | Freq: Two times a day (BID) | ORAL | 0 refills | Status: AC

## 2021-10-06 NOTE — Progress Notes (Signed)
° °  Virtual Visit via Video Note  I connected with  Erika Johns  on 10/06/21 at 12:00 PM EST by a video enabled telemedicine application and verified that I am speaking with the correct person using two identifiers.  Location: Patient: home Provider: Therapist, music at [place] present: Patient and myself   I discussed the limitations of evaluation and management by telemedicine and the availability of in person appointments. The patient expressed understanding and agreed to proceed.   History of Present Illness:  71 yo female pt presents for rebound COVID-19. She had a virtual visit with me on 09/27/21 and was given Paxlovid. She took the full course of medication and was feeling better, even tested negative on 10/03/21, and then started back with symptoms last night and tested positive (2/14/). She is having a headache, fatigue, and congestion, but doesn't feel as bad as the first visit.    Observations/Objective:  Temp 98.1 F   Gen: Awake, alert, no acute distress Resp: Breathing is even and non-labored Psych: calm/pleasant demeanor Neuro: Alert and Oriented x 3, + facial symmetry, speech is clear.   Assessment and Plan:  COVID-19 - Likely rebound s/p treatment with Paxlovid. Mild symptoms at this time. Providing pocket-Rx for molnupiravir to start in the next few days if she doesn't start to have a quick turn-around with symptoms or starts feeling worse. Reminded her of red flags to watch for. She will recheck prn and contact us with updates.   Follow Up Instructions:    I discussed the assessment and treatment plan with the patient. The patient was provided an opportunity to ask questions and all were answered. The patient agreed with the plan and demonstrated an understanding of the instructions.   The patient was advised to call back or seek an in-person evaluation if the symptoms worsen or if the condition fails to improve as  anticipated.  Fidencia Mccloud M Dudley Mages, PA-C

## 2021-10-13 DIAGNOSIS — M8589 Other specified disorders of bone density and structure, multiple sites: Secondary | ICD-10-CM | POA: Diagnosis not present

## 2021-10-15 ENCOUNTER — Telehealth: Payer: Self-pay

## 2021-10-15 NOTE — Telephone Encounter (Signed)
Caller name:[employer] manager   On DPR? :Yes  Call back number:[phone]  Provider they see: Carlota Raspberry  Reason for call: Pt is calling pt has Covid Feb 6 and took Paxlovid then had a relapse pt tested negative on Wed but she is not improving the cough what can she get to take to help with the cough. Pt has a Phy on this coming Wed with Dr Carlota Raspberry.

## 2021-10-15 NOTE — Telephone Encounter (Signed)
Will need appointment for rebound COVID

## 2021-10-18 NOTE — Telephone Encounter (Signed)
Pt is scheduled for an appt with Delfino Lovett

## 2021-10-19 ENCOUNTER — Other Ambulatory Visit: Payer: Self-pay

## 2021-10-19 ENCOUNTER — Ambulatory Visit (INDEPENDENT_AMBULATORY_CARE_PROVIDER_SITE_OTHER): Payer: PPO | Admitting: Registered Nurse

## 2021-10-19 ENCOUNTER — Encounter: Payer: Self-pay | Admitting: Registered Nurse

## 2021-10-19 VITALS — BP 106/60 | HR 75 | Temp 97.8°F | Resp 18 | Ht 70.0 in | Wt 149.6 lb

## 2021-10-19 DIAGNOSIS — M810 Age-related osteoporosis without current pathological fracture: Secondary | ICD-10-CM | POA: Diagnosis not present

## 2021-10-19 DIAGNOSIS — R052 Subacute cough: Secondary | ICD-10-CM

## 2021-10-19 DIAGNOSIS — R5383 Other fatigue: Secondary | ICD-10-CM | POA: Diagnosis not present

## 2021-10-19 DIAGNOSIS — E559 Vitamin D deficiency, unspecified: Secondary | ICD-10-CM | POA: Diagnosis not present

## 2021-10-19 MED ORDER — PREDNISONE 20 MG PO TABS: 20.0000 mg | ORAL_TABLET | Freq: Every day | ORAL | 0 refills | Status: DC

## 2021-10-19 MED ORDER — PSEUDOEPHEDRINE-GUAIFENESIN 40-400 MG PO TABS: 1.0000 | ORAL_TABLET | Freq: Two times a day (BID) | ORAL | 0 refills | Status: DC

## 2021-10-19 MED ORDER — BENZONATATE 100 MG PO CAPS: 100.0000 mg | ORAL_CAPSULE | Freq: Two times a day (BID) | ORAL | 0 refills | Status: DC | PRN

## 2021-10-19 NOTE — Patient Instructions (Addendum)
Ms. Erika Johns to meet you  Continue zoloft! It's a great medication and there's no concerns with long term use  I have sent cough suppressants to the pharmacy. Take for the next few days and we can kick this post-infectious cough  Thank you,  Rich     If you have lab work done today you will be contacted with your lab results within the next 2 weeks.  If you have not heard from [name] then please contact us. The fastest way to get your results is to register for My Chart.   IF you received an x-ray today, you will receive an invoice from [facility]. Please contact [facility] at [phone] with questions or concerns regarding your invoice.   IF you received labwork today, you will receive an invoice from [facility]. Please contact [facility] at [phone] with questions or concerns regarding your invoice.   Our billing staff will not be able to assist you with questions regarding bills from these companies.  You will be contacted with the lab results as soon as they are available. The fastest way to get your results is to activate your My Chart account. Instructions are located on the last page of this paperwork. If you have not heard from [name] regarding the results in 2 weeks, please contact this office.

## 2021-10-19 NOTE — Progress Notes (Signed)
Established Patient Office Visit  Subjective:  Patient ID: Erika Johns, female    DOB: 1951-07-24  Age: 71 y.o. MRN: [id number]  CC:  Chief Complaint  Patient presents with   Cough    Patient states he had had covid the beginning of Feb. Patient states she was giving Paxlovid and got rebound covid. Patient states she is not 100 percent better at day 21.    HPI Erika Johns presents for rebound COVID  Dx originally at [facility] on 09/27/21, symptoms started 09/24/21 Had VV with Alyssa Alwardt, PA, given paxlovid, improved dramatically.   She unfortunately had rebound covid noted on 10/04/21 Seen via VV again that day. Given molnupiravir - chose not to take this.  Notes there is some improvement but cough has persisted, sneezing and runny nose ongoing.   Past Medical History:  Diagnosis Date   Allergy    Anxiety    Arthritis    Constipation, chronic    Depression    Hyperlipidemia    Phreesia 03/07/2020   IBS (irritable bowel syndrome)    Insomnia    PONV (postoperative nausea and vomiting)    severe n/v-needs scop    Past Surgical History:  Procedure Laterality Date   COLONOSCOPY     DILATION AND CURETTAGE OF UTERUS  2006   [place]   fx tib-fib-rt   HARDWARE REMOVAL  1996   rt ankle   KNEE ARTHROSCOPY  2003   lt   KNEE ARTHROSCOPY     right   ORIF CLAVICULAR FRACTURE Right 05/28/2013   Procedure: OPEN REDUCTION INTERNAL FIXATION (ORIF) CLAVICULAR FRACTURE;  Surgeon: Renette Butters, MD;  Location: [facility];  Service: Orthopedics;  Laterality: Right;   TONSILLECTOMY      History reviewed. No pertinent family history.  Social History   Socioeconomic History   Marital status: Widowed    Spouse name: Not on file   Number of children: Not on file   Years of education: Not on file   Highest education level: Not on file  Occupational History   Not on file  Tobacco Use   Smoking status: Never    Smokeless tobacco: Never  Substance and Sexual Activity   Alcohol use: Yes    Alcohol/week: 1.0 standard drink    Types: 1 Glasses of wine per week   Drug use: No   Sexual activity: Yes  Other Topics Concern   Not on file  Social History Narrative   Not on file   Social Determinants of Health   Financial Resource Strain: Not on file  Food Insecurity: Not on file  Transportation Needs: Not on file  Physical Activity: Not on file  Stress: Not on file  Social Connections: Not on file  Intimate Partner Violence: Not on file    Outpatient Medications Prior to Visit  Medication Sig Dispense Refill   albuterol (VENTOLIN HFA) 108 (90 Base) MCG/ACT inhaler Inhale 1-2 puffs into the lungs every 4 (four) hours as needed for wheezing or shortness of breath. 18 g 2   Ascorbic Acid (VITAMIN C) 1000 MG tablet Take 1,000 mg by mouth daily.     atorvastatin (LIPITOR) 40 MG tablet Take 1 tablet (40 mg total) by mouth every other day. 45 tablet 1   cetirizine (ZYRTEC) 10 MG tablet Take 1 tablet (10 mg total) by mouth at bedtime. 60 tablet 3   Cholecalciferol (VITAMIN D3) 1000 UNITS CAPS Take by mouth daily.  clonazePAM (KLONOPIN) 0.5 MG tablet Take 0.5-1 tablets (0.25-0.5 mg total) by mouth 2 (two) times daily as needed for anxiety. 45 tablet 1   denosumab (PROLIA) 60 MG/ML SOSY injection Inject 60 mg into the skin every 6 (six) months.     diclofenac sodium (VOLTAREN) 1 % GEL Apply 2 g topically 4 (four) times daily as needed (left knee pain). 100 g 0   fluticasone (FLONASE) 50 MCG/ACT nasal spray SPRAY 2 SPRAYS INTO EACH NOSTRIL EVERY DAY 16 mL 6   glucosamine-chondroitin 500-400 MG tablet Take 1 tablet by mouth. 2 gel caps at breakfast     halobetasol (ULTRAVATE) 0.05 % cream Apply topically 2 (two) times daily. 50 g 0   hydrocortisone (ANUSOL-HC) 2.5 % rectal cream Place 1 application rectally 2 (two) times daily. As needed short term for itching 30 g 0   OVER THE COUNTER MEDICATION       sertraline (ZOLOFT) 50 MG tablet Take 0.5-1 tablets (25-50 mg total) by mouth daily. 90 tablet 1   Suvorexant (BELSOMRA) 10 MG TABS Take 5-10 mg by mouth at bedtime as needed. 30 tablet 3   tamsulosin (FLOMAX) 0.4 MG CAPS capsule Take 0.4 mg by mouth.     UNABLE TO FIND Med Name: TUMERIC     UNABLE TO FIND Med Name: Constance Haw     vitamin k 100 MCG tablet Take 100 mcg by mouth daily.     No facility-administered medications prior to visit.    Allergies  Allergen Reactions   Other Rash   Effexor [Venlafaxine Hydrochloride] Other (See Comments)    dizzy   Hydrocodone Nausea And Vomiting   Ampicillin Rash    ROS Review of Systems  Constitutional: Negative.   HENT:  Positive for sneezing. Negative for congestion, dental problem, drooling, ear discharge, ear pain, facial swelling, hearing loss, mouth sores, nosebleeds, postnasal drip, rhinorrhea, sinus pressure, sinus pain, sore throat, tinnitus, trouble swallowing and voice change.   Eyes: Negative.   Respiratory:  Positive for cough. Negative for apnea, choking, chest tightness, shortness of breath, wheezing and stridor.   Cardiovascular: Negative.   Gastrointestinal: Negative.   Genitourinary: Negative.   Musculoskeletal: Negative.   Skin: Negative.   Neurological: Negative.   Psychiatric/Behavioral: Negative.    All other systems reviewed and are negative.    Objective:    Physical Exam Vitals and nursing note reviewed.  Constitutional:      General: She is not in acute distress.    Appearance: Normal appearance. She is normal weight. She is not ill-appearing, toxic-appearing or diaphoretic.  Cardiovascular:     Rate and Rhythm: Normal rate and regular rhythm.     Heart sounds: Normal heart sounds. No murmur heard.   No friction rub. No gallop.  Pulmonary:     Effort: Pulmonary effort is normal. No respiratory distress.     Breath sounds: Normal breath sounds. No stridor. No wheezing, rhonchi or rales.  Chest:     Chest  wall: No tenderness.  Skin:    General: Skin is warm and dry.  Neurological:     General: No focal deficit present.     Mental Status: She is alert and oriented to person, place, and time. Mental status is at baseline.  Psychiatric:        Mood and Affect: Mood normal.        Behavior: Behavior normal.        Thought Content: Thought content normal.  Judgment: Judgment normal.    BP 106/60    Pulse 75    Temp 97.8 F (36.6 C) (Temporal)    Resp 18    Ht _0  (1.778 m)    Wt 149 lb 9.6 oz (67.9 kg)    SpO2 100%    BMI 21.47 kg/m  Wt Readings from Last 3 Encounters:  10/19/21 149 lb 9.6 oz (67.9 kg)  09/27/21 146 lb (66.2 kg)  11/09/20 149 lb (67.6 kg)     Health Maintenance Due  Topic Date Due   COVID-19 Vaccine (4 - Booster) 08/14/2020    There are no preventive care reminders to display for this patient.  Lab Results  Component Value Date   TSH 1.985 06/23/2014   Lab Results  Component Value Date   WBC 17.8 (H) 05/22/2013   HGB 12.4 05/28/2013   HCT 37.9 05/22/2013   MCV 88.8 05/22/2013   PLT 132 (L) 05/22/2013   Lab Results  Component Value Date   NA 144 11/04/2020   K 4.1 11/04/2020   CO2 23 11/04/2020   GLUCOSE 92 11/04/2020   BUN 23 11/04/2020   CREATININE 0.92 11/04/2020   BILITOT 0.4 11/04/2020   ALKPHOS 55 11/04/2020   AST 25 11/04/2020   ALT 25 11/04/2020   PROT 6.1 11/04/2020   ALBUMIN 4.0 11/04/2020   CALCIUM 8.5 (L) 11/04/2020   EGFR 67 11/04/2020   Lab Results  Component Value Date   CHOL 157 07/13/2021   Lab Results  Component Value Date   HDL 65.90 07/13/2021   Lab Results  Component Value Date   LDLCALC 74 07/13/2021   Lab Results  Component Value Date   TRIG 82.0 07/13/2021   Lab Results  Component Value Date   CHOLHDL 2 07/13/2021   No results found for: HGBA1C    Assessment & Plan:   Problem List Items Addressed This Visit   None Visit Diagnoses     Subacute cough    -  Primary   Relevant Medications    Pseudoephedrine-guaiFENesin 40-400 MG TABS   predniSONE (DELTASONE) 20 MG tablet   benzonatate (TESSALON) 100 MG capsule       Meds ordered this encounter  Medications   Pseudoephedrine-guaiFENesin 40-400 MG TABS    Sig: Take 1 tablet by mouth in the morning and at bedtime.    Dispense:  56 tablet    Refill:  0    Order Specific Question:   Supervising Provider    Answer:   Carlota Raspberry, JEFFREY R [[id number]]   predniSONE (DELTASONE) 20 MG tablet    Sig: Take 1 tablet (20 mg total) by mouth daily with breakfast.    Dispense:  5 tablet    Refill:  0    Order Specific Question:   Supervising Provider    Answer:   Carlota Raspberry, JEFFREY R [[id number]]   benzonatate (TESSALON) 100 MG capsule    Sig: Take 1 capsule (100 mg total) by mouth 2 (two) times daily as needed for cough.    Dispense:  20 capsule    Refill:  0    Order Specific Question:   Supervising Provider    Answer:   Carlota Raspberry, JEFFREY R [[id number]]    Follow-up: Return if symptoms worsen or fail to improve.   PLAN Post-infectious cough Will give tessalon, prednisone burst, and azelastine  Return if worsening or failing to improve Patient encouraged to call clinic with any questions, comments, or concerns.  Maximiano Coss, NP

## 2021-10-20 ENCOUNTER — Ambulatory Visit (INDEPENDENT_AMBULATORY_CARE_PROVIDER_SITE_OTHER): Payer: PPO | Admitting: Family Medicine

## 2021-10-20 ENCOUNTER — Encounter: Payer: Self-pay | Admitting: Family Medicine

## 2021-10-20 VITALS — HR 69 | Temp 98.1°F | Resp 17 | Ht 70.0 in | Wt 150.4 lb

## 2021-10-20 DIAGNOSIS — H9193 Unspecified hearing loss, bilateral: Secondary | ICD-10-CM | POA: Diagnosis not present

## 2021-10-20 DIAGNOSIS — Z0000 Encounter for general adult medical examination without abnormal findings: Secondary | ICD-10-CM

## 2021-10-20 DIAGNOSIS — Z0001 Encounter for general adult medical examination with abnormal findings: Secondary | ICD-10-CM

## 2021-10-20 DIAGNOSIS — R059 Cough, unspecified: Secondary | ICD-10-CM | POA: Diagnosis not present

## 2021-10-20 NOTE — Patient Instructions (Signed)
Thanks for coming in today.  See information below regarding health maintenance after age 72.  I will place a referral to audiology to evaluate for hearing aids.  Glad to hear the cough is better. I expect you to continue to improve with the medication from my colleague yesterday.   Keep me posted and take care.  ? ? ?Health Maintenance After Age 39 ?After age 58, you are at a higher risk for certain long-term diseases and infections as well as injuries from falls. Falls are a major cause of broken bones and head injuries in people who are older than age 77. Getting regular preventive care can help to keep you healthy and well. Preventive care includes getting regular testing and making lifestyle changes as recommended by your health care provider. Talk with your health care provider about: ?Which screenings and tests you should have. A screening is a test that checks for a disease when you have no symptoms. ?A diet and exercise plan that is right for you. ?What should I know about screenings and tests to prevent falls? ?Screening and testing are the best ways to find a health problem early. Early diagnosis and treatment give you the best chance of managing medical conditions that are common after age 86. Certain conditions and lifestyle choices may make you more likely to have a fall. Your health care provider may recommend: ?Regular vision checks. Poor vision and conditions such as cataracts can make you more likely to have a fall. If you wear glasses, make sure to get your prescription updated if your vision changes. ?Medicine review. Work with your health care provider to regularly review all of the medicines you are taking, including over-the-counter medicines. Ask your health care provider about any side effects that may make you more likely to have a fall. Tell your health care provider if any medicines that you take make you feel dizzy or sleepy. ?Strength and balance checks. Your health care provider may  recommend certain tests to check your strength and balance while standing, walking, or changing positions. ?Foot health exam. Foot pain and numbness, as well as not wearing proper footwear, can make you more likely to have a fall. ?Screenings, including: ?Osteoporosis screening. Osteoporosis is a condition that causes the bones to get weaker and break more easily. ?Blood pressure screening. Blood pressure changes and medicines to control blood pressure can make you feel dizzy. ?Depression screening. You may be more likely to have a fall if you have a fear of falling, feel depressed, or feel unable to do activities that you used to do. ?Alcohol use screening. Using too much alcohol can affect your balance and may make you more likely to have a fall. ?Follow these instructions at home: ?Lifestyle ?Do not drink alcohol if: ?Your health care provider tells you not to drink. ?If you drink alcohol: ?Limit how much you have to: ?0-1 drink a day for women. ?0-2 drinks a day for men. ?Know how much alcohol is in your drink. In the [place] one drink equals one 12 oz bottle of beer (355 mL), one 5 oz glass of wine (148 mL), or one 1? oz glass of hard liquor (44 mL). ?Do not use any products that contain nicotine or tobacco. These products include cigarettes, chewing tobacco, and vaping devices, such as e-cigarettes. If you need help quitting, ask your health care provider. ?Activity ? ?Follow a regular exercise program to stay fit. This will help you maintain your balance. Ask your health care provider  what types of exercise are appropriate for you. ?If you need a cane or walker, use it as recommended by your health care provider. ?Wear supportive shoes that have nonskid soles. ?Safety ? ?Remove any tripping hazards, such as rugs, cords, and clutter. ?Install safety equipment such as grab bars in bathrooms and safety rails on stairs. ?Keep rooms and walkways well-lit. ?General instructions ?Talk with your health care provider  about your risks for falling. Tell your health care provider if: ?You fall. Be sure to tell your health care provider about all falls, even ones that seem minor. ?You feel dizzy, tiredness (fatigue), or off-balance. ?Take over-the-counter and prescription medicines only as told by your health care provider. These include supplements. ?Eat a healthy diet and maintain a healthy weight. A healthy diet includes low-fat dairy products, low-fat (lean) meats, and fiber from whole grains, beans, and lots of fruits and vegetables. ?Stay current with your vaccines. ?Schedule regular health, dental, and eye exams. ?Summary ?Having a healthy lifestyle and getting preventive care can help to protect your health and wellness after age 64. ?Screening and testing are the best way to find a health problem early and help you avoid having a fall. Early diagnosis and treatment give you the best chance for managing medical conditions that are more common for people who are older than age 42. ?Falls are a major cause of broken bones and head injuries in people who are older than age 54. Take precautions to prevent a fall at home. ?Work with your health care provider to learn what changes you can make to improve your health and wellness and to prevent falls. ?This information is not intended to replace advice given to you by your health care provider. Make sure you discuss any questions you have with your health care provider. ?Document Revised: 12/28/2020 Document Reviewed: 12/28/2020 ?Elsevier Patient Education ? 2022 [employer]. ? ? ? ?

## 2021-10-20 NOTE — Progress Notes (Signed)
Subjective:  Patient ID: Erika Johns, female    DOB: 01-Dec-1950  Age: 71 y.o. MRN: [id number]  CC:  Chief Complaint  Patient presents with   Annual Exam    Pt here for annual exam, pt brought Bone density results, also had some labs done murphey and wainer was meant to be faxed back to [name] within today, reports does need some refills today    Referral    Pt would like referral to audiologist for changes in hearing over the last several years.    HPI Erika Johns presents for   Annual exam and other concerns as above. Chronic medical problems were discussed in November. Labs were performed by Ortho yesterday.  Lipid panel in 11/22.  No refills needed today.   Hearing deficit/difficulty Noticed over the past few years requests referral to audiology for further testing.  Sensorineural hearing loss, bilateral noted on her problem list. Has been advised in past of possible need for hearing aids. Notices some issues with hearing at performance at [facility]. Ready to pursue some hearing aids.  Per 04/04/17 audiology note: Audiogram indicates mild to moderate sensorineural hearing loss. SRTs were 20dB in the right ear and 25dB in the left ear.   Speech discrimination Right ear: 92%, 65dB/45dB masking Left ear: 100%, 65dB/45dB masking  Transducer: inserts  Test Relibility: good  Tympanograms: Right ear: Type A Left ear: Type A   Bone density testing performed on 10/13/2021, osteopenia with significantly increase in BMD of all sites measured compared to 2020.  Treated by Dr. Layne Benton, on Prolia.  Visit with Rich yesterday for subacute cough. Treated with tessalon, prednisone, cough meds.  Care team: PCP: me Ortho/osteoporosis: Dr. Layne Benton Ortho: Lucey - R knee Synvisc next week Urology: MaDiarmid Derm: Dr. Jarome Matin Podiatry: Milinda Pointer. Gyn: Fogleman  Fall screening Fall Risk  10/19/2021 11/09/2020 08/11/2020 04/30/2020 04/30/2020  Falls in the past  year? 0 0 0 0 0  Number falls in past yr: 0 - 0 0 0  Injury with Fall? 0 - 0 0 0  Risk for fall due to : No Fall Risks - - - -  Follow up Falls evaluation completed Falls evaluation completed Falls evaluation completed Falls evaluation completed Falls evaluation completed   Lighting in home: adequate lighting.  Loose rugs/carpets/pets: none Stairs:single level, stairs into home with handrail.  Grab bars in bathroom: does have in 1 bathroom.  Timed up and go:7 seconds, normal gait, no instability.   Depression Screening: Depression screen [facility] 2/9 07/08/2021 11/09/2020 08/11/2020 04/30/2020 03/10/2020  Decreased Interest 0 0 0 0 0  Down, Depressed, Hopeless 1 0 0 0 0  PHQ - 2 Score 1 0 0 0 0  Altered sleeping 1 - - - -  Tired, decreased energy 0 - - - -  Change in appetite 0 - - - -  Feeling bad or failure about yourself  0 - - - -  Trouble concentrating 0 - - - -  Moving slowly or fidgety/restless 0 - - - -  Suicidal thoughts 0 - - - -  PHQ-9 Score 2 - - - -  Difficult doing work/chores - - - - -    Cancer Screening: Colonoscopy: 08/27/18, repeat 5 years.  Mammogram: 04/29/21 Pap with GYN.   Immunization History  Administered Date(s) Administered   Influenza Split 04/28/2012, 05/22/2013, 06/05/2014   Influenza, High Dose Seasonal PF 06/13/2017, 05/24/2018, 05/24/2018, 05/18/2019, 05/18/2019, 05/11/2020   Influenza,inj,Quad PF,6+ Mos 05/12/2016   Influenza-Unspecified  05/25/2015, 06/13/2017   Moderna Sars-Covid-2 Vaccination 09/23/2019, 09/23/2019, 10/22/2019, 06/19/2020   PFIZER(Purple Top)SARS-COV-2 Vaccination 11/28/2020, 05/13/2021   Pneumococcal Conjugate-13 01/12/2015, 05/31/2015   Pneumococcal Polysaccharide-23 09/27/2016   Td 11/22/2007   Tdap 03/22/2018   Zoster Recombinat (Shingrix) 07/23/2018, 11/08/2018   Zoster, Live 06/22/2010  Flu vaccine at pharmacy in the fall.   Functional Status Survey: Is the patient deaf or have difficulty hearing?: Yes (some decreased  hearing) Does the patient have difficulty seeing, even when wearing glasses/contacts?: No Does the patient have difficulty concentrating, remembering, or making decisions?: No Does the patient have difficulty walking or climbing stairs?: No Does the patient have difficulty dressing or bathing?: No Does the patient have difficulty doing errands alone such as visiting a doctor's office or shopping?: No  Memory Screen: 6CIT Screen 10/20/2021 03/10/2020 11/26/2018 04/27/2017  What Year? 0 points 0 points 0 points 0 points  What month? 0 points 0 points 0 points 0 points  What time? 0 points 0 points 0 points 0 points  Count back from 20 0 points 0 points 0 points 0 points  Months in reverse 0 points 0 points 0 points 0 points  Repeat phrase 0 points 0 points 0 points 0 points  Total Score 0 0 0 0    Alcohol Screening: [place] Office Visit from 10/20/2021 in [place]  AUDIT-C Score 0     2 glasses of wine per week.   Tobacco: None.   No results found. Optho/optometry: followed by optho, prior R cataract surgery, appt within 1 year  Dental: Every 6 months.   Exercise: Walking multiple days per week. Biking 1-2 times per week for 1-2 hours.   Advanced Directives:  Living will - no changes requested.    History Patient Active Problem List   Diagnosis Date Noted   Bilateral hearing loss 05/20/2019   Bilateral impacted cerumen 05/20/2019   Pain in joint, ankle and foot 12/25/2017   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/03/2017   Achilles tendon contracture, right 05/03/2017   Eustachian tube dysfunction, left 04/04/2017   Sensorineural hearing loss (SNHL), bilateral 04/04/2017   Constipation 01/09/2014   Osteoarthritis of left knee 08/27/2009   LUMBAGO 08/27/2009   TROCHANTERIC BURSITIS 08/27/2009   HAND PAIN, LEFT 08/27/2009   Past Medical History:  Diagnosis Date   Allergy    Anxiety    Arthritis    Constipation, chronic     Depression    Hyperlipidemia    Phreesia 03/07/2020   IBS (irritable bowel syndrome)    Insomnia    PONV (postoperative nausea and vomiting)    severe n/v-needs scop   Past Surgical History:  Procedure Laterality Date   COLONOSCOPY     DILATION AND CURETTAGE OF UTERUS  2006   [place]   fx tib-fib-rt   HARDWARE REMOVAL  1996   rt ankle   KNEE ARTHROSCOPY  2003   lt   KNEE ARTHROSCOPY     right   ORIF CLAVICULAR FRACTURE Right 05/28/2013   Procedure: OPEN REDUCTION INTERNAL FIXATION (ORIF) CLAVICULAR FRACTURE;  Surgeon: Renette Butters, MD;  Location: [facility];  Service: Orthopedics;  Laterality: Right;   TONSILLECTOMY     Allergies  Allergen Reactions   Other Rash   Effexor [Venlafaxine Hydrochloride] Other (See Comments)    dizzy   Hydrocodone Nausea And Vomiting   Ampicillin Rash   Prior to Admission medications   Medication Sig Start Date End  Date Taking? Authorizing Provider  albuterol (VENTOLIN HFA) 108 (90 Base) MCG/ACT inhaler Inhale 1-2 puffs into the lungs every 4 (four) hours as needed for wheezing or shortness of breath. 08/11/20  Yes Just, Laurita Quint, FNP  Ascorbic Acid (VITAMIN C) 1000 MG tablet Take 1,000 mg by mouth daily.   Yes [provider]  atorvastatin (LIPITOR) 40 MG tablet Take 1 tablet (40 mg total) by mouth every other day. 07/08/21  Yes [name], MD  benzonatate (TESSALON) 100 MG capsule Take 1 capsule (100 mg total) by mouth 2 (two) times daily as needed for cough. 10/19/21  Yes Maximiano Coss, NP  cetirizine (ZYRTEC) 10 MG tablet Take 1 tablet (10 mg total) by mouth at bedtime. 08/11/20  Yes Just, Laurita Quint, FNP  Cholecalciferol (VITAMIN D3) 1000 UNITS CAPS Take by mouth daily.   Yes [provider]  clonazePAM (KLONOPIN) 0.5 MG tablet Take 0.5-1 tablets (0.25-0.5 mg total) by mouth 2 (two) times daily as needed for anxiety. 10/10/19  Yes [name], MD  denosumab (PROLIA) 60 MG/ML SOSY  injection Inject 60 mg into the skin every 6 (six) months.   Yes [provider]  diclofenac sodium (VOLTAREN) 1 % GEL Apply 2 g topically 4 (four) times daily as needed (left knee pain). 12/25/17  Yes Everrett Coombe, MD  fluticasone (FLONASE) 50 MCG/ACT nasal spray SPRAY 2 SPRAYS INTO EACH NOSTRIL EVERY DAY 08/11/20  Yes Just, Laurita Quint, FNP  glucosamine-chondroitin 500-400 MG tablet Take 1 tablet by mouth. 2 gel caps at breakfast   Yes [provider]  halobetasol (ULTRAVATE) 0.05 % cream Apply topically 2 (two) times daily. 06/05/18  Yes [name], MD  hydrocortisone (ANUSOL-HC) 2.5 % rectal cream Place 1 application rectally 2 (two) times daily. As needed short term for itching 11/05/19  Yes [name], MD  OVER THE COUNTER MEDICATION    Yes [provider]  predniSONE (DELTASONE) 20 MG tablet Take 1 tablet (20 mg total) by mouth daily with breakfast. 10/19/21  Yes Maximiano Coss, NP  Pseudoephedrine-guaiFENesin 40-400 MG TABS Take 1 tablet by mouth in the morning and at bedtime. 10/19/21  Yes Maximiano Coss, NP  sertraline (ZOLOFT) 50 MG tablet Take 0.5-1 tablets (25-50 mg total) by mouth daily. 07/08/21  Yes [name], MD  Suvorexant (BELSOMRA) 10 MG TABS Take 5-10 mg by mouth at bedtime as needed. 11/09/20  Yes [name], MD  tamsulosin (FLOMAX) 0.4 MG CAPS capsule Take 0.4 mg by mouth.   Yes [provider]  UNABLE TO FIND Med Name: [facility]   Yes [provider]  UNABLE TO FIND Med Name: Rocco Pauls [provider]  vitamin k 100 MCG tablet Take 100 mcg by mouth daily.   Yes [provider]   Social History   Socioeconomic History   Marital status: Widowed    Spouse name: Not on file   Number of children: Not on file   Years of education: Not on file   Highest education level: Not on file  Occupational History   Not on file  Tobacco Use   Smoking status: Never   Smokeless tobacco: Never   Substance and Sexual Activity   Alcohol use: Yes    Alcohol/week: 1.0 standard drink    Types: 1 Glasses of wine per week    Comment: rare   Drug use: No   Sexual activity: Not Currently  Other Topics Concern   Not on  file  Social History Narrative   Not on file   Social Determinants of Health   Financial Resource Strain: Not on file  Food Insecurity: Not on file  Transportation Needs: Not on file  Physical Activity: Not on file  Stress: Not on file  Social Connections: Not on file  Intimate Partner Violence: Not on file    Review of Systems  13 point review of systems per patient health survey noted.  Negative other than as indicated above or in HPI.   Objective:   Vitals:   10/20/21 1355  Pulse: 69  Resp: 17  Temp: 98.1 F (36.7 C)  TempSrc: Temporal  SpO2: 96%  Weight: 150 lb 6.4 oz (68.2 kg)  Height: 5\' 10"  (1.778 m)    Physical Exam Constitutional:      Appearance: She is well-developed.  HENT:     Head: Normocephalic and atraumatic.     Right Ear: External ear normal.     Left Ear: External ear normal.     Ears:     Comments: Narrow canals with small amount of cerumen, unable to visualize TMs  Eyes:     Conjunctiva/sclera: Conjunctivae normal.     Pupils: Pupils are equal, round, and reactive to light.  Neck:     Thyroid: No thyromegaly.  Cardiovascular:     Rate and Rhythm: Normal rate and regular rhythm.     Heart sounds: Normal heart sounds. No murmur heard. Pulmonary:     Effort: Pulmonary effort is normal. No respiratory distress.     Breath sounds: Normal breath sounds. No wheezing.  Abdominal:     General: Bowel sounds are normal.     Palpations: Abdomen is soft.     Tenderness: There is no abdominal tenderness.  Musculoskeletal:        General: No tenderness. Normal range of motion.     Cervical back: Normal range of motion and neck supple.  Lymphadenopathy:     Cervical: No cervical adenopathy.  Skin:    General: Skin is warm and  dry.     Findings: No rash.  Neurological:     Mental Status: She is alert and oriented to person, place, and time.  Psychiatric:        Behavior: Behavior normal.        Thought Content: Thought content normal.     Assessment & Plan:  Erika Johns is a 71 y.o. female . Medicare annual wellness visit, subsequent  - - anticipatory guidance as below in AVS, screening labs if needed. Health maintenance items as above in HPI discussed/recommended as applicable.  - no concerning responses on depression, fall, or functional status screening. Any positive responses noted as above. Advanced directives discussed as in CHL.   Hearing difficulty of both ears - Plan: Ambulatory referral to Audiology  -With history of sensorineural hearing loss.  Refer to audiology to evaluate for hearing aids at this time.  Cough Improving after recent treatment.  Postinfectious cough likely.  RTC precautions if not continuing to improve.  No orders of the defined types were placed in this encounter.  Patient Instructions  Thanks for coming in today.  See information below regarding health maintenance after age 56.  I will place a referral to audiology to evaluate for hearing aids.  Glad to hear the cough is better. I expect you to continue to improve with the medication from my colleague yesterday.   Keep me posted and take care.    Health  Maintenance After Age 63 After age 92, you are at a higher risk for certain long-term diseases and infections as well as injuries from falls. Falls are a major cause of broken bones and head injuries in people who are older than age 63. Getting regular preventive care can help to keep you healthy and well. Preventive care includes getting regular testing and making lifestyle changes as recommended by your health care provider. Talk with your health care provider about: Which screenings and tests you should have. A screening is a test that checks for a disease when  you have no symptoms. A diet and exercise plan that is right for you. What should I know about screenings and tests to prevent falls? Screening and testing are the best ways to find a health problem early. Early diagnosis and treatment give you the best chance of managing medical conditions that are common after age 50. Certain conditions and lifestyle choices may make you more likely to have a fall. Your health care provider may recommend: Regular vision checks. Poor vision and conditions such as cataracts can make you more likely to have a fall. If you wear glasses, make sure to get your prescription updated if your vision changes. Medicine review. Work with your health care provider to regularly review all of the medicines you are taking, including over-the-counter medicines. Ask your health care provider about any side effects that may make you more likely to have a fall. Tell your health care provider if any medicines that you take make you feel dizzy or sleepy. Strength and balance checks. Your health care provider may recommend certain tests to check your strength and balance while standing, walking, or changing positions. Foot health exam. Foot pain and numbness, as well as not wearing proper footwear, can make you more likely to have a fall. Screenings, including: Osteoporosis screening. Osteoporosis is a condition that causes the bones to get weaker and break more easily. Blood pressure screening. Blood pressure changes and medicines to control blood pressure can make you feel dizzy. Depression screening. You may be more likely to have a fall if you have a fear of falling, feel depressed, or feel unable to do activities that you used to do. Alcohol use screening. Using too much alcohol can affect your balance and may make you more likely to have a fall. Follow these instructions at home: Lifestyle Do not drink alcohol if: Your health care provider tells you not to drink. If you drink  alcohol: Limit how much you have to: 0-1 drink a day for women. 0-2 drinks a day for men. Know how much alcohol is in your drink. In the [place] one drink equals one 12 oz bottle of beer (355 mL), one 5 oz glass of wine (148 mL), or one 1 oz glass of hard liquor (44 mL). Do not use any products that contain nicotine or tobacco. These products include cigarettes, chewing tobacco, and vaping devices, such as e-cigarettes. If you need help quitting, ask your health care provider. Activity  Follow a regular exercise program to stay fit. This will help you maintain your balance. Ask your health care provider what types of exercise are appropriate for you. If you need a cane or walker, use it as recommended by your health care provider. Wear supportive shoes that have nonskid soles. Safety  Remove any tripping hazards, such as rugs, cords, and clutter. Install safety equipment such as grab bars in bathrooms and safety rails on stairs. Keep rooms and walkways  well-lit. General instructions Talk with your health care provider about your risks for falling. Tell your health care provider if: You fall. Be sure to tell your health care provider about all falls, even ones that seem minor. You feel dizzy, tiredness (fatigue), or off-balance. Take over-the-counter and prescription medicines only as told by your health care provider. These include supplements. Eat a healthy diet and maintain a healthy weight. A healthy diet includes low-fat dairy products, low-fat (lean) meats, and fiber from whole grains, beans, and lots of fruits and vegetables. Stay current with your vaccines. Schedule regular health, dental, and eye exams. Summary Having a healthy lifestyle and getting preventive care can help to protect your health and wellness after age 20. Screening and testing are the best way to find a health problem early and help you avoid having a fall. Early diagnosis and treatment give you the best chance for  managing medical conditions that are more common for people who are older than age 38. Falls are a major cause of broken bones and head injuries in people who are older than age 48. Take precautions to prevent a fall at home. Work with your health care provider to learn what changes you can make to improve your health and wellness and to prevent falls. This information is not intended to replace advice given to you by your health care provider. Make sure you discuss any questions you have with your health care provider. Document Revised: 12/28/2020 Document Reviewed: 12/28/2020 Elsevier Patient Education  2022 [place],   Merri Ray, MD [place] 10/20/21 3:24 PM

## 2021-10-26 DIAGNOSIS — M810 Age-related osteoporosis without current pathological fracture: Secondary | ICD-10-CM | POA: Diagnosis not present

## 2021-10-26 DIAGNOSIS — E559 Vitamin D deficiency, unspecified: Secondary | ICD-10-CM | POA: Diagnosis not present

## 2021-10-27 ENCOUNTER — Other Ambulatory Visit: Payer: Self-pay | Admitting: Family Medicine

## 2021-10-27 DIAGNOSIS — F418 Other specified anxiety disorders: Secondary | ICD-10-CM

## 2021-10-27 DIAGNOSIS — F439 Reaction to severe stress, unspecified: Secondary | ICD-10-CM

## 2021-10-27 DIAGNOSIS — M1711 Unilateral primary osteoarthritis, right knee: Secondary | ICD-10-CM | POA: Diagnosis not present

## 2021-10-27 NOTE — Telephone Encounter (Signed)
Patient is requesting a refill of the following medications: ?Requested Prescriptions  ? ?Pending Prescriptions Disp Refills  ? clonazePAM (KLONOPIN) 0.5 MG tablet [Pharmacy Med Name: CLONAZEPAM 0.5 MG TABLET] 45 tablet   ?  Sig: Take 0.5-1 tablets (0.25-0.5 mg total) by mouth 2 (two) times daily as needed for anxiety.  ? ? ?Date of patient request: 10/27/21 ?Last office visit: 10/20/21 ?Date of last refill: 10/10/19 ?Last refill amount: 45 ? ?

## 2021-10-27 NOTE — Telephone Encounter (Signed)
Controlled substance database (PDMP) reviewed. No concerns appreciated. Belsomra rx noted.  ?Refill ordered.  ?

## 2021-11-01 ENCOUNTER — Other Ambulatory Visit: Payer: Self-pay

## 2021-11-01 ENCOUNTER — Ambulatory Visit: Payer: PPO

## 2021-11-01 DIAGNOSIS — M778 Other enthesopathies, not elsewhere classified: Secondary | ICD-10-CM

## 2021-11-01 NOTE — Progress Notes (Signed)
Patient seen for evaluation for replacement of lost left foot orthosis. Patient has not been seen by this practice since October of 2021. Recommended patient see Dr. Milinda Pointer for a new prescription in order to allow insurance to take effect. Patient agreed with plan of care and will be making an appointment to see Dr. Milinda Pointer so that we may build a fresh set of orthotics instead of trying to match a previous one. ?

## 2021-11-10 DIAGNOSIS — L821 Other seborrheic keratosis: Secondary | ICD-10-CM | POA: Diagnosis not present

## 2021-11-10 DIAGNOSIS — D225 Melanocytic nevi of trunk: Secondary | ICD-10-CM | POA: Diagnosis not present

## 2021-11-10 DIAGNOSIS — L720 Epidermal cyst: Secondary | ICD-10-CM | POA: Diagnosis not present

## 2021-11-10 DIAGNOSIS — L820 Inflamed seborrheic keratosis: Secondary | ICD-10-CM | POA: Diagnosis not present

## 2021-11-18 DIAGNOSIS — H903 Sensorineural hearing loss, bilateral: Secondary | ICD-10-CM | POA: Diagnosis not present

## 2021-11-23 ENCOUNTER — Ambulatory Visit: Payer: PPO

## 2021-11-23 ENCOUNTER — Ambulatory Visit: Payer: PPO | Admitting: Podiatry

## 2021-11-23 ENCOUNTER — Encounter: Payer: Self-pay | Admitting: Podiatry

## 2021-11-23 DIAGNOSIS — M778 Other enthesopathies, not elsewhere classified: Secondary | ICD-10-CM | POA: Diagnosis not present

## 2021-11-23 NOTE — Progress Notes (Signed)
SITUATION ?Reason for Consult: Evaluation for Bilateral Custom Foot Orthoses ?Patient / Caregiver Report: Patient is ready for foot orthotics ? ?OBJECTIVE DATA: ?Patient History / Diagnosis:  ?  ICD-10-CM   ?1. Capsulitis of right foot  M77.8   ?  ? ? ?Current or Previous Devices:   Current user ? ?Foot Examination: ?Skin presentation:   Intact ?Ulcers & Callousing:   None ?Toe / Foot Deformities:  Prominent met heads ?Weight Bearing Presentation:  Rectus ?Sensation:    Intact ? ?Shoe Size:    10.5N ? ?ORTHOTIC RECOMMENDATION ?Recommended Device: 1x pair of custom functional foot orthotics ? ?GOALS OF ORTHOSES ?- Reduce Pain ?- Prevent Foot Deformity ?- Prevent Progression of Further Foot Deformity ?- Relieve Pressure ?- Improve the Overall Biomechanical Function of the Foot and Lower Extremity. ? ?ACTIONS PERFORMED ?Potential out of pocket cost was communicated to patient. Patient understood and consent to casting. Patient was casted for Foot Orthoses via crush box. Procedure was explained and patient tolerated procedure well. Casts were shipped to central fabrication. All questions were answered and concerns addressed. ? ?Patient also requested refabrication of left vinyl topped foot orthosis and refurbishment of right ? ?PLAN ?Patient is to be called for fitting when devices are ready.  ? ? ?

## 2021-11-23 NOTE — Progress Notes (Signed)
She presents today after having not seen her for couple of years with a chief concern of numbness and tingling to her forefoot and around the outside edge more on the right than the left.  She states that she loves her orthotics the one she likes the best that fit her.  Issues was left on the plane her right one.  She would like to have more orthotics made. ? ?Objective: Vital signs are stable alert oriented x3 she has a cavus foot deformity hammertoe deformities are flexible in nature no no hammertoe or mallet toe deformity to the first metatarsal phalangeal joint and hallux.  That appears to be normal and healthy.  [place] neurology was negative for neuropathy. ? ?Assessment most likely cavus foot deformity and moderate metatarsalgia resulting in her numbness and tingling around the foot and the lateral aspect. ? ?Plan: Discussed etiology pathology and surgical therapies we will have her new orthotics made today. ?

## 2021-12-10 ENCOUNTER — Telehealth: Payer: Self-pay

## 2021-12-10 NOTE — Telephone Encounter (Signed)
LVM today 136pm for OPU.  ?

## 2021-12-14 ENCOUNTER — Ambulatory Visit (INDEPENDENT_AMBULATORY_CARE_PROVIDER_SITE_OTHER): Payer: PPO

## 2021-12-14 ENCOUNTER — Other Ambulatory Visit: Payer: PPO

## 2021-12-14 DIAGNOSIS — M778 Other enthesopathies, not elsewhere classified: Secondary | ICD-10-CM

## 2021-12-14 DIAGNOSIS — Z0000 Encounter for general adult medical examination without abnormal findings: Secondary | ICD-10-CM | POA: Diagnosis not present

## 2021-12-14 NOTE — Progress Notes (Signed)
? ?Subjective:  ? Erika Johns is a 71 y.o. female who presents for Medicare Annual (Subsequent) preventive examination. ? ? ?I connected with Erika Johns today by telephone and verified that I am speaking with the correct person using two identifiers. ?Location patient: home ?Location provider: work ?Persons participating in the virtual visit: patient, provider. ?  ?I discussed the limitations, risks, security and privacy concerns of performing an evaluation and management service by telephone and the availability of in person appointments. I also discussed with the patient that there may be a patient responsible charge related to this service. The patient expressed understanding and verbally consented to this telephonic visit.  ?  ?Interactive audio and video telecommunications were attempted between this provider and patient, however failed, due to patient having technical difficulties OR patient did not have access to video capability.  We continued and completed visit with audio only. ? ?  ?Review of Systems    ? ?Cardiac Risk Factors include: advanced age (>62mn, >>27women) ? ?   ?Objective:  ?  ?Today's Vitals  ? ?There is no height or weight on file to calculate BMI. ? ? ?  12/14/2021  ?  8:27 AM 10/20/2021  ?  2:01 PM 03/10/2020  ?  8:19 AM 11/26/2018  ? 10:09 AM 04/27/2017  ?  3:38 PM 04/03/2017  ?  9:50 AM 10/21/2015  ? 11:55 AM  ?Advanced Directives  ?Does Patient Have a Medical Advance Directive? Yes Yes Yes Yes Yes No Yes  ?Type of [place]amed[place] will [place] Park[place] will [place]os AlvarezLiving will  Living will  ?Does patient want to make changes to medical advance directive?  No - Patient declined  No - Patient declined     ?Copy of [place] Chart? No - copy requested  No - copy requested No - copy requested No - copy requested  No - copy requested  ?Would  patient like information on creating a medical advance directive?      No - Patient declined   ? ? ?Current Medications (verified) ?Outpatient Encounter Medications as of 12/14/2021  ?Medication Sig  ? albuterol (VENTOLIN HFA) 108 (90 Base) MCG/ACT inhaler Inhale 1-2 puffs into the lungs every 4 (four) hours as needed for wheezing or shortness of breath.  ? Ascorbic Acid (VITAMIN C) 1000 MG tablet Take 1,000 mg by mouth daily.  ? atorvastatin (LIPITOR) 40 MG tablet Take 1 tablet (40 mg total) by mouth every other day.  ? cetirizine (ZYRTEC) 10 MG tablet Take 1 tablet (10 mg total) by mouth at bedtime.  ? Cholecalciferol (VITAMIN D3) 1000 UNITS CAPS Take by mouth daily.  ? clonazePAM (KLONOPIN) 0.5 MG tablet TAKE 0.5-1 TABLETS (0.25-0.5 MG TOTAL) BY MOUTH 2 (TWO) TIMES DAILY AS NEEDED FOR ANXIETY.  ? denosumab (PROLIA) 60 MG/ML SOSY injection Inject 60 mg into the skin every 6 (six) months.  ? diclofenac sodium (VOLTAREN) 1 % GEL Apply 2 g topically 4 (four) times daily as needed (left knee pain).  ? fluticasone (FLONASE) 50 MCG/ACT nasal spray SPRAY 2 SPRAYS INTO EACH NOSTRIL EVERY DAY  ? glucosamine-chondroitin 500-400 MG tablet Take 1 tablet by mouth. 2 gel caps at breakfast  ? halobetasol (ULTRAVATE) 0.05 % cream Apply topically 2 (two) times daily.  ? hydrocortisone (ANUSOL-HC) 2.5 % rectal cream Place 1 application rectally 2 (two) times daily. As needed short term for itching  ?  OVER THE COUNTER MEDICATION   ? Pseudoephedrine-guaiFENesin 40-400 MG TABS Take 1 tablet by mouth in the morning and at bedtime.  ? sertraline (ZOLOFT) 50 MG tablet Take 0.5-1 tablets (25-50 mg total) by mouth daily.  ? Suvorexant (BELSOMRA) 10 MG TABS Take 5-10 mg by mouth at bedtime as needed.  ? tamsulosin (FLOMAX) 0.4 MG CAPS capsule Take 0.4 mg by mouth.  ? UNABLE TO FIND Med Name: TUMERIC  ? UNABLE TO FIND Med Name: Constance Haw  ? vitamin k 100 MCG tablet Take 100 mcg by mouth daily.  ? benzonatate (TESSALON) 100 MG capsule Take 1  capsule (100 mg total) by mouth 2 (two) times daily as needed for cough. (Patient not taking: Reported on 12/14/2021)  ? predniSONE (DELTASONE) 20 MG tablet Take 1 tablet (20 mg total) by mouth daily with breakfast. (Patient not taking: Reported on 12/14/2021)  ? ?No facility-administered encounter medications on file as of 12/14/2021.  ? ? ?Allergies (verified) ?Other, Effexor [venlafaxine hydrochloride], Hydrocodone, and Ampicillin  ? ?History: ?Past Medical History:  ?Diagnosis Date  ? Allergy   ? Anxiety   ? Arthritis   ? Constipation, chronic   ? Depression   ? Hyperlipidemia   ? Phreesia 03/07/2020  ? IBS (irritable bowel syndrome)   ? Insomnia   ? PONV (postoperative nausea and vomiting)   ? severe n/v-needs scop  ? ?Past Surgical History:  ?Procedure Laterality Date  ? COLONOSCOPY    ? DILATION AND CURETTAGE OF UTERUS  2006  ? FRACTURE SURGERY  1996  ? fx tib-fib-rt  ? [place]  ? rt ankle  ? KNEE ARTHROSCOPY  2003  ? lt  ? KNEE ARTHROSCOPY    ? right  ? ORIF CLAVICULAR FRACTURE Right 05/28/2013  ? Procedure: OPEN REDUCTION INTERNAL FIXATION (ORIF) CLAVICULAR FRACTURE;  Surgeon: Renette Butters, MD;  Location: [facility];  Service: Orthopedics;  Laterality: Right;  ? TONSILLECTOMY    ? ?History reviewed. No pertinent family history. ?Social History  ? ?Socioeconomic History  ? Marital status: Widowed  ?  Spouse name: Not on file  ? Number of children: Not on file  ? Years of education: Not on file  ? Highest education level: Not on file  ?Occupational History  ? Not on file  ?Tobacco Use  ? Smoking status: Never  ? Smokeless tobacco: Never  ?Substance and Sexual Activity  ? Alcohol use: Yes  ?  Alcohol/week: 1.0 standard drink  ?  Types: 1 Glasses of wine per week  ?  Comment: rare  ? Drug use: No  ? Sexual activity: Not Currently  ?Other Topics Concern  ? Not on file  ?Social History Narrative  ? Not on file  ? ?Social Determinants of Health  ? ?Financial Resource Strain: Low Risk    ? Difficulty of Paying Living Expenses: Not hard at all  ?Food Insecurity: No Food Insecurity  ? Worried About Charity fundraiser in the Last Year: Never true  ? Ran Out of Food in the Last Year: Never true  ?Transportation Needs: No Transportation Needs  ? Lack of Transportation (Medical): No  ? Lack of Transportation (Non-Medical): No  ?Physical Activity: Sufficiently Active  ? Days of Exercise per Week: 4 days  ? Minutes of Exercise per Session: 40 min  ?Stress: No Stress Concern Present  ? Feeling of Stress : Only a little  ?Social Connections: Moderately Isolated  ? Frequency of Communication with Friends and Family: Twice  a week  ? Frequency of Social Gatherings with Friends and Family: Twice a week  ? Attends Religious Services: Never  ? Active Member of Clubs or Organizations: No  ? Attends Archivist Meetings: More than 4 times per year  ? Marital Status: Widowed  ? ? ?Tobacco Counseling ?Counseling given: Not Answered ? ? ?Clinical Intake: ? ?Pre-visit preparation completed: Yes ? ?Pain : No/denies pain ? ?  ? ?Nutritional Risks: None ?Diabetes: No ? ?How often do you need to have someone help you when you read instructions, pamphlets, or other written materials from your doctor or pharmacy?: 1 - Never ?What is the last grade level you completed in school?: college ? ?Diabetic?no  ? ?Interpreter Needed?: No ? ?Information entered by :: [email or web address] ? ? ?Activities of Daily Living ? ?  12/14/2021  ?  8:31 AM 10/20/2021  ?  1:59 PM  ?In your present state of health, do you have any difficulty performing the following activities:  ?Hearing? 0 1  ?Comment  some decreased hearing  ?Vision? 0 0  ?Difficulty concentrating or making decisions? 0 0  ?Walking or climbing stairs? 0 0  ?Dressing or bathing? 0 0  ?Doing errands, shopping? 0 0  ?Preparing Food and eating ? N   ?Using the Toilet? N   ?In the past six months, have you accidently leaked urine? N   ?Do you have problems with loss of bowel control? N    ?Managing your Medications? N   ?Managing your Finances? N   ?Housekeeping or managing your Housekeeping? N   ? ? ?Patient Care Team: ?[name], MD as PCP - General (Family Medicine) ?Specialist

## 2021-12-14 NOTE — Patient Instructions (Signed)
Ms. Finau , ?Thank you for taking time to come for your Medicare Wellness Visit. I appreciate your ongoing commitment to your health goals. Please review the following plan we discussed and let me know if I can assist you in the future.  ? ?Screening recommendations/referrals: ?Colonoscopy: 09/06/2018 ?Mammogram: 04/29/2021 ?Bone Density: 10/13/2021 ?Recommended yearly ophthalmology/optometry visit for glaucoma screening and checkup ?Recommended yearly dental visit for hygiene and checkup ? ?Vaccinations: ?Influenza vaccine: completed  ?Pneumococcal vaccine: completed  ?Tdap vaccine: 03/22/2018 ?Shingles vaccine: completed    ? ?Advanced directives: yes ? ?Conditions/risks identified: none   ? ?Next appointment: none  ? ? ?Preventive Care 72 Years and Older, Female ?Preventive care refers to lifestyle choices and visits with your health care provider that can promote health and wellness. ?What does preventive care include? ?A yearly physical exam. This is also called an annual well check. ?Dental exams once or twice a year. ?Routine eye exams. Ask your health care provider how often you should have your eyes checked. ?Personal lifestyle choices, including: ?Daily care of your teeth and gums. ?Regular physical activity. ?Eating a healthy diet. ?Avoiding tobacco and drug use. ?Limiting alcohol use. ?Practicing safe sex. ?Taking low-dose aspirin every day. ?Taking vitamin and mineral supplements as recommended by your health care provider. ?What happens during an annual well check? ?The services and screenings done by your health care provider during your annual well check will depend on your age, overall health, lifestyle risk factors, and family history of disease. ?Counseling  ?Your health care provider may ask you questions about your: ?Alcohol use. ?Tobacco use. ?Drug use. ?Emotional well-being. ?Home and relationship well-being. ?Sexual activity. ?Eating habits. ?History of falls. ?Memory and ability  to understand (cognition). ?Work and work Statistician. ?Reproductive health. ?Screening  ?You may have the following tests or measurements: ?Height, weight, and BMI. ?Blood pressure. ?Lipid and cholesterol levels. These may be checked every 5 years, or more frequently if you are over 72 years old. ?Skin check. ?Lung cancer screening. You may have this screening every year starting at age 72 if you have a 30-pack-year history of smoking and currently smoke or have quit within the past 15 years. ?Fecal occult blood test (FOBT) of the stool. You may have this test every year starting at age 72. ?Flexible sigmoidoscopy or colonoscopy. You may have a sigmoidoscopy every 5 years or a colonoscopy every 10 years starting at age 72. ?Hepatitis C blood test. ?Hepatitis B blood test. ?Sexually transmitted disease (STD) testing. ?Diabetes screening. This is done by checking your blood sugar (glucose) after you have not eaten for a while (fasting). You may have this done every 1-3 years. ?Bone density scan. This is done to screen for osteoporosis. You may have this done starting at age 72. ?Mammogram. This may be done every 1-2 years. Talk to your health care provider about how often you should have regular mammograms. ?Talk with your health care provider about your test results, treatment options, and if necessary, the need for more tests. ?Vaccines  ?Your health care provider may recommend certain vaccines, such as: ?Influenza vaccine. This is recommended every year. ?Tetanus, diphtheria, and acellular pertussis (Tdap, Td) vaccine. You may need a Td booster every 10 years. ?Zoster vaccine. You may need this after age 64. ?Pneumococcal 13-valent conjugate (PCV13) vaccine. One dose is recommended after age 43. ?Pneumococcal polysaccharide (PPSV23) vaccine. One dose is recommended after age 41. ?Talk to your health care provider about which screenings and vaccines you need and how often you  need them. ?This information is not  intended to replace advice given to you by your health care provider. Make sure you discuss any questions you have with your health care provider. ?Document Released: 09/04/2015 Document Revised: 04/27/2016 Document Reviewed: 06/09/2015 ?Elsevier Interactive Patient Education ? 2017 [place]. ? ?Fall Prevention in the Home ?Falls can cause injuries. They can happen to people of all ages. There are many things you can do to make your home safe and to help prevent falls. ?What can I do on the outside of my home? ?Regularly fix the edges of walkways and driveways and fix any cracks. ?Remove anything that might make you trip as you walk through a door, such as a raised step or threshold. ?Trim any bushes or trees on the path to your home. ?Use bright outdoor lighting. ?Clear any walking paths of anything that might make someone trip, such as rocks or tools. ?Regularly check to see if handrails are loose or broken. Make sure that both sides of any steps have handrails. ?Any raised decks and porches should have guardrails on the edges. ?Have any leaves, snow, or ice cleared regularly. ?Use sand or salt on walking paths during winter. ?Clean up any spills in your garage right away. This includes oil or grease spills. ?What can I do in the bathroom? ?Use night lights. ?Install grab bars by the toilet and in the tub and shower. Do not use towel bars as grab bars. ?Use non-skid mats or decals in the tub or shower. ?If you need to sit down in the shower, use a plastic, non-slip stool. ?Keep the floor dry. Clean up any water that spills on the floor as soon as it happens. ?Remove soap buildup in the tub or shower regularly. ?Attach bath mats securely with double-sided non-slip rug tape. ?Do not have throw rugs and other things on the floor that can make you trip. ?What can I do in the bedroom? ?Use night lights. ?Make sure that you have a light by your bed that is easy to reach. ?Do not use any sheets or blankets that are  too big for your bed. They should not hang down onto the floor. ?Have a firm chair that has side arms. You can use this for support while you get dressed. ?Do not have throw rugs and other things on the floor that can make you trip. ?What can I do in the kitchen? ?Clean up any spills right away. ?Avoid walking on wet floors. ?Keep items that you use a lot in easy-to-reach places. ?If you need to reach something above you, use a strong step stool that has a grab bar. ?Keep electrical cords out of the way. ?Do not use floor polish or wax that makes floors slippery. If you must use wax, use non-skid floor wax. ?Do not have throw rugs and other things on the floor that can make you trip. ?What can I do with my stairs? ?Do not leave any items on the stairs. ?Make sure that there are handrails on both sides of the stairs and use them. Fix handrails that are broken or loose. Make sure that handrails are as long as the stairways. ?Check any carpeting to make sure that it is firmly attached to the stairs. Fix any carpet that is loose or worn. ?Avoid having throw rugs at the top or bottom of the stairs. If you do have throw rugs, attach them to the floor with carpet tape. ?Make sure that you have a light switch  at the top of the stairs and the bottom of the stairs. If you do not have them, ask someone to add them for you. ?What else can I do to help prevent falls? ?Wear shoes that: ?Do not have high heels. ?Have rubber bottoms. ?Are comfortable and fit you well. ?Are closed at the toe. Do not wear sandals. ?If you use a stepladder: ?Make sure that it is fully opened. Do not climb a closed stepladder. ?Make sure that both sides of the stepladder are locked into place. ?Ask someone to hold it for you, if possible. ?Clearly mark and make sure that you can see: ?Any grab bars or handrails. ?First and last steps. ?Where the edge of each step is. ?Use tools that help you move around (mobility aids) if they are needed. These  include: ?Canes. ?Walkers. ?Scooters. ?Crutches. ?Turn on the lights when you go into a dark area. Replace any light bulbs as soon as they burn out. ?Set up your furniture so you have a clear path. Avoid moving

## 2021-12-14 NOTE — Progress Notes (Signed)
SITUATION: ?Reason for Visit: Fitting and Delivery of Custom Fabricated Foot Orthoses ?Patient Report: Patient reports comfort and is satisfied with device. ? ?OBJECTIVE DATA: ?Patient History / Diagnosis:   ?  ICD-10-CM   ?1. Capsulitis of right foot  M77.8   ?  ? ? ?Provided Device:  Custom Functional Foot Orthotics ?    [facility]: [id number] - new pair ?    [facility]: [id number] - Replacement Left ? ?GOAL OF ORTHOSIS ?- Improve gait ?- Decrease energy expenditure ?- Improve Balance ?- Provide Triplanar stability of foot complex ?- Facilitate motion ? ?ACTIONS PERFORMED ?Patient was fit with foot orthotics trimmed to shoe last. Patient tolerated fittign procedure.  ? ?Patient was provided with verbal and written instruction and demonstration regarding donning, doffing, wear, care, proper fit, function, purpose, cleaning, and use of the orthosis and in all related precautions and risks and benefits regarding the orthosis. ? ?Patient was also provided with verbal instruction regarding how to report any failures or malfunctions of the orthosis and necessary follow up care. Patient was also instructed to contact our office regarding any change in status that may affect the function of the orthosis. ? ?Patient demonstrated independence with proper donning, doffing, and fit and verbalized understanding of all instructions. ? ?PLAN: ?Patient is to contact us if she cannot find her right foot orthosis, she is convinced she left it here at [facility]. All questions were answered and concerns addressed. Plan of care was discussed with and agreed upon by the patient. ? ?

## 2021-12-20 ENCOUNTER — Ambulatory Visit: Payer: PPO

## 2021-12-20 DIAGNOSIS — M778 Other enthesopathies, not elsewhere classified: Secondary | ICD-10-CM

## 2021-12-20 NOTE — Progress Notes (Signed)
SITUATION: ?Reason for Visit: Fitting and Delivery of Custom Fabricated Foot Orthoses ?Patient Report: Patient reports comfort and is satisfied with device. ? ?OBJECTIVE DATA: ?Patient History / Diagnosis:   ?  ICD-10-CM   ?1. Capsulitis of right foot  M77.8   ?  ? ? ?Provided Device:  Custom Functional Foot Orthotics ?    [facility]: [id number] & [id number] ? ?GOAL OF ORTHOSIS ?- Improve gait ?- Decrease energy expenditure ?- Improve Balance ?- Provide Triplanar stability of foot complex ?- Facilitate motion ? ?ACTIONS PERFORMED ?Patient was fit with foot orthotics trimmed to shoe last. Patient tolerated fittign procedure.  ? ?Patient was provided with verbal and written instruction and demonstration regarding donning, doffing, wear, care, proper fit, function, purpose, cleaning, and use of the orthosis and in all related precautions and risks and benefits regarding the orthosis. ? ?Patient was also provided with verbal instruction regarding how to report any failures or malfunctions of the orthosis and necessary follow up care. Patient was also instructed to contact our office regarding any change in status that may affect the function of the orthosis. ? ?Patient demonstrated independence with proper donning, doffing, and fit and verbalized understanding of all instructions. ? ?PLAN: ?Patient is to follow up in one week or as necessary (PRN). All questions were answered and concerns addressed. Plan of care was discussed with and agreed upon by the patient. ? ?

## 2021-12-22 ENCOUNTER — Ambulatory Visit: Payer: PPO | Admitting: Family Medicine

## 2021-12-30 ENCOUNTER — Encounter: Payer: Self-pay | Admitting: Family Medicine

## 2021-12-30 ENCOUNTER — Ambulatory Visit (INDEPENDENT_AMBULATORY_CARE_PROVIDER_SITE_OTHER): Payer: PPO | Admitting: Family Medicine

## 2021-12-30 VITALS — BP 114/62 | HR 55 | Temp 98.0°F | Resp 16 | Ht 70.0 in | Wt 148.2 lb

## 2021-12-30 DIAGNOSIS — G4700 Insomnia, unspecified: Secondary | ICD-10-CM | POA: Diagnosis not present

## 2021-12-30 DIAGNOSIS — R252 Cramp and spasm: Secondary | ICD-10-CM | POA: Diagnosis not present

## 2021-12-30 DIAGNOSIS — F418 Other specified anxiety disorders: Secondary | ICD-10-CM

## 2021-12-30 DIAGNOSIS — E785 Hyperlipidemia, unspecified: Secondary | ICD-10-CM

## 2021-12-30 DIAGNOSIS — F439 Reaction to severe stress, unspecified: Secondary | ICD-10-CM

## 2021-12-30 DIAGNOSIS — K5909 Other constipation: Secondary | ICD-10-CM | POA: Diagnosis not present

## 2021-12-30 MED ORDER — ATORVASTATIN CALCIUM 40 MG PO TABS: 40.0000 mg | ORAL_TABLET | ORAL | 1 refills | Status: DC

## 2021-12-30 MED ORDER — BELSOMRA 10 MG PO TABS: 5.0000 mg | ORAL_TABLET | Freq: Every evening | ORAL | 3 refills | Status: DC | PRN

## 2021-12-30 MED ORDER — SERTRALINE HCL 50 MG PO TABS: 25.0000 mg | ORAL_TABLET | Freq: Every day | ORAL | 1 refills | Status: DC

## 2021-12-30 NOTE — Patient Instructions (Addendum)
It is up to you about repeat covid vaccine booster. You are just outside the 3 month window from last infection.  ?Look online for calcium amount in food. You should be getting 1200-'1500mg'$  per day form all sources combined.  ?I will check labs, but if leg cramps continue please schedule separate visit to discuss further. ?Follow-up with gastroenterology to discuss other treatments for constipation. ?No medication changes today. ?Fasting lab visit at your convenience at the [facility] lab.  Thanks for coming in today. ?[facility] ?Walk in 8:30-4:30 during weekdays, no appointment needed ?[address]  ?[place], [place] [address] ? ? ? ? ?

## 2021-12-30 NOTE — Progress Notes (Signed)
? ?Subjective:  ?Patient ID: Erika Johns, female    DOB: 1950/10/20  Age: 71 y.o. MRN: [id number] ? ?CC:  ?Chief Complaint  ?Patient presents with  ? Hyperlipidemia  ?  Pt due for recheck on labs, notes she is not fasting today  ? Insomnia  ?  Pt due for refills no concerns   ? Depression  ?  Doing okay, notes needs refills   ? ? ?HPI ?Erika Johns presents for  ?Items above and list of other concerns.  ? ?Constipation: ?Chronic for years. Citrucel in am, miralax daily, glycerin suppository at times.  - stable recently, planned GI appt in June - may need to reschedule. [facility].  ? ?Leg cramps: ?Notices in am, better with stretching. There for years, not every morning. Left leg, resolves with stretch. Drinking water throughout day.  ? ?Vaccine questions: ?Covid infection 09/27/21, last covid vaccine in 04/2021 - bivalent vaccine.  ?Traveling 5/23 for 1 month to visit sister and mother in [place]. Considering repeat booster. Mother recently had Covid few weeks ago (prior infection in 2020).  ? ?Depression: ?She is Zoloft 25 mg daily, is taking half of the 50 mg.  Option of higher doses discussed previously. Staying at '25mg'$   - requests  to stay at same dose.  [facility]  [facility]  Family health stressors with sister in [place], 55 yo mother. Brother in Sports coach passed April 21st. Doing ok.  ?Rare need for Klonopin, 1/2-1/4 as needed. Still using using sparingly. Less than monthly, few days at a time.  Is taking the melatonin, CBD Gummies at times for sleep that have usually been effective with Belsomra 1/2 pill approximately 2 times per week.  Exercises regularly with biking. 32 miles yesterday.  ?Question of calcium - concerned she may be taking too much. Takes calcium supplement with ca/mg, zinc - '500mg'$  per day plus food intake.   ? ? ?  12/30/2021  ?  9:45 AM 12/14/2021  ?  8:31 AM 12/14/2021  ?  8:25 AM 07/08/2021  ?  4:27 PM 11/09/2020  ?  3:32 PM  ?Depression  screen PHQ 2/9  ?Decreased Interest 0 0 0 0 0  ?Down, Depressed, Hopeless 1 0 0 1 0  ?PHQ - 2 Score 1 0 0 1 0  ?Altered sleeping 1   1   ?Tired, decreased energy 0   0   ?Change in appetite 0   0   ?Feeling bad or failure about yourself  0   0   ?Trouble concentrating 0   0   ?Moving slowly or fidgety/restless 0   0   ?Suicidal thoughts 0   0   ?PHQ-9 Score 2   2   ? ? ? ?Hyperlipidemia: ?Lipitor 40 mg daily. ?Blood work performed by orthopedics, Dr. Layne Benton who is following her for osteoporosis, treated with Prolia. ?Last ate 3.5 hrs ago.  ?Lab Results  ?Component Value Date  ? CHOL 157 07/13/2021  ? HDL 65.90 07/13/2021  ? [place] 74 07/13/2021  ? TRIG 82.0 07/13/2021  ? CHOLHDL 2 07/13/2021  ? ?Lab Results  ?Component Value Date  ? ALT 25 11/04/2020  ? AST 25 11/04/2020  ? ALKPHOS 55 11/04/2020  ? BILITOT 0.4 11/04/2020  ? ? ? ?History ?Patient Active Problem List  ? Diagnosis Date Noted  ? Bilateral hearing loss 05/20/2019  ? Bilateral impacted cerumen 05/20/2019  ? Pain in joint, ankle and foot 12/25/2017  ? Cavus deformity  of right foot 05/03/2017  ? Achilles tendon contracture, right 05/03/2017  ? Eustachian tube dysfunction, left 04/04/2017  ? Sensorineural hearing loss (SNHL), bilateral 04/04/2017  ? Constipation 01/09/2014  ? Osteoarthritis of left knee 08/27/2009  ? LUMBAGO 08/27/2009  ? TROCHANTERIC BURSITIS 08/27/2009  ? HAND PAIN, LEFT 08/27/2009  ? ?Past Medical History:  ?Diagnosis Date  ? Allergy   ? Anxiety   ? Arthritis   ? Constipation, chronic   ? Depression   ? Hyperlipidemia   ? Phreesia 03/07/2020  ? IBS (irritable bowel syndrome)   ? Insomnia   ? PONV (postoperative nausea and vomiting)   ? severe n/v-needs scop  ? ?Past Surgical History:  ?Procedure Laterality Date  ? COLONOSCOPY    ? DILATION AND CURETTAGE OF UTERUS  2006  ? FRACTURE SURGERY  1996  ? fx tib-fib-rt  ? [place]  ? rt ankle  ? KNEE ARTHROSCOPY  2003  ? lt  ? KNEE ARTHROSCOPY    ? right  ? ORIF CLAVICULAR  FRACTURE Right 05/28/2013  ? Procedure: OPEN REDUCTION INTERNAL FIXATION (ORIF) CLAVICULAR FRACTURE;  Surgeon: Renette Butters, MD;  Location: [facility];  Service: Orthopedics;  Laterality: Right;  ? TONSILLECTOMY    ? ?Allergies  ?Allergen Reactions  ? Other Rash  ? Effexor [Venlafaxine Hydrochloride] Other (See Comments)  ?  dizzy  ? Hydrocodone Nausea And Vomiting  ? Ampicillin Rash  ? ?Prior to Admission medications   ?Medication Sig Start Date End Date Taking? Authorizing Provider  ?Ascorbic Acid (VITAMIN C) 1000 MG tablet Take 1,000 mg by mouth daily.   Yes [provider]  ?atorvastatin (LIPITOR) 40 MG tablet Take 1 tablet (40 mg total) by mouth every other day. 07/08/21  Yes [name], MD  ?cetirizine (ZYRTEC) 10 MG tablet Take 1 tablet (10 mg total) by mouth at bedtime. 08/11/20  Yes Just, Laurita Quint, FNP  ?Cholecalciferol (VITAMIN D3) 1000 UNITS CAPS Take by mouth daily.   Yes [provider]  ?clonazePAM (KLONOPIN) 0.5 MG tablet TAKE 0.5-1 TABLETS (0.25-0.5 MG TOTAL) BY MOUTH 2 (TWO) TIMES DAILY AS NEEDED FOR ANXIETY. 10/27/21  Yes [name], MD  ?denosumab (PROLIA) 60 MG/ML SOSY injection Inject 60 mg into the skin every 6 (six) months.   Yes [provider]  ?diclofenac sodium (VOLTAREN) 1 % GEL Apply 2 g topically 4 (four) times daily as needed (left knee pain). 12/25/17  Yes Everrett Coombe, MD  ?glucosamine-chondroitin 500-400 MG tablet Take 1 tablet by mouth. 2 gel caps at breakfast   Yes [provider]  ?hydrocortisone (ANUSOL-HC) 2.5 % rectal cream Place 1 application rectally 2 (two) times daily. As needed short term for itching 11/05/19  Yes [name], MD  ?OVER THE COUNTER MEDICATION    Yes [provider]  ?Pseudoephedrine-guaiFENesin 40-400 MG TABS Take 1 tablet by mouth in the morning and at bedtime. 10/19/21  Yes Maximiano Coss, NP  ?sertraline (ZOLOFT) 50 MG tablet Take 0.5-1 tablets (25-50 mg total) by mouth  daily. 07/08/21  Yes [name], MD  ?Suvorexant (BELSOMRA) 10 MG TABS Take 5-10 mg by mouth at bedtime as needed. 11/09/20  Yes [name], MD  ?tamsulosin (FLOMAX) 0.4 MG CAPS capsule Take 0.4 mg by mouth.   Yes [provider]  ?[place] Name: TUMERIC   Yes [provider]  ?[place] Name: Rocco Pauls [provider]  ?vitamin  k 100 MCG tablet Take 100 mcg by mouth daily.   Yes [provider]  ?albuterol (VENTOLIN HFA) 108 (90 Base) MCG/ACT inhaler Inhale 1-2 puffs into the lungs every 4 (four) hours as needed for wheezing or shortness of breath. ?Patient not taking: Reported on 12/30/2021 08/11/20   Just, Laurita Quint, FNP  ?benzonatate (TESSALON) 100 MG capsule Take 1 capsule (100 mg total) by mouth 2 (two) times daily as needed for cough. ?Patient not taking: Reported on 12/14/2021 10/19/21   Maximiano Coss, NP  ?fluticasone (FLONASE) 50 MCG/ACT nasal spray SPRAY 2 SPRAYS INTO EACH NOSTRIL EVERY DAY ?Patient not taking: Reported on 12/30/2021 08/11/20   Just, Laurita Quint, FNP  ?predniSONE (DELTASONE) 20 MG tablet Take 1 tablet (20 mg total) by mouth daily with breakfast. ?Patient not taking: Reported on 12/14/2021 10/19/21   Maximiano Coss, NP  ? ?Social History  ? ?Socioeconomic History  ? Marital status: Widowed  ?  Spouse name: Not on file  ? Number of children: Not on file  ? Years of education: Not on file  ? Highest education level: Not on file  ?Occupational History  ? Not on file  ?Tobacco Use  ? Smoking status: Never  ? Smokeless tobacco: Never  ?Substance and Sexual Activity  ? Alcohol use: Yes  ?  Alcohol/week: 1.0 standard drink  ?  Types: 1 Glasses of wine per week  ?  Comment: rare  ? Drug use: No  ? Sexual activity: Not Currently  ?Other Topics Concern  ? Not on file  ?Social History Narrative  ? Not on file  ? ?Social Determinants of Health  ? ?Financial Resource Strain: Low Risk   ? Difficulty of Paying Living Expenses: Not hard at  all  ?Food Insecurity: No Food Insecurity  ? Worried About Charity fundraiser in the Last Year: Never true  ? Ran Out of Food in the Last Year: Never true  ?Transportation Needs: No Transportation Needs  ? Lack of Tr

## 2022-01-05 ENCOUNTER — Other Ambulatory Visit (INDEPENDENT_AMBULATORY_CARE_PROVIDER_SITE_OTHER): Payer: PPO

## 2022-01-05 DIAGNOSIS — E785 Hyperlipidemia, unspecified: Secondary | ICD-10-CM | POA: Diagnosis not present

## 2022-01-05 DIAGNOSIS — R252 Cramp and spasm: Secondary | ICD-10-CM | POA: Diagnosis not present

## 2022-01-05 LAB — COMPREHENSIVE METABOLIC PANEL
ALT: 17 U/L (ref 0–35)
AST: 16 U/L (ref 0–37)
Albumin: 4.1 g/dL (ref 3.5–5.2)
Alkaline Phosphatase: 40 U/L (ref 39–117)
BUN: 14 mg/dL (ref 6–23)
CO2: 26 mEq/L (ref 19–32)
Calcium: 8.7 mg/dL (ref 8.4–10.5)
Chloride: 105 mEq/L (ref 96–112)
Creatinine, Ser: 0.79 mg/dL (ref 0.40–1.20)
GFR: 74.8 mL/min (ref >60.00)
Glucose, Bld: 90 mg/dL (ref 70–99)
Potassium: 3.9 mEq/L (ref 3.5–5.1)
Sodium: 141 mEq/L (ref 135–145)
Total Bilirubin: 0.6 mg/dL (ref 0.2–1.2)
Total Protein: 6.4 g/dL (ref 6.0–8.3)

## 2022-01-05 LAB — LIPID PANEL
Cholesterol: 164 mg/dL (ref 0–200)
HDL: 72.1 mg/dL (ref >39.00)
LDL Cholesterol: 81 mg/dL (ref 0–99)
NonHDL: 92.26
Total CHOL/HDL Ratio: 2
Triglycerides: 56 mg/dL (ref 0.0–149.0)
VLDL: 11.2 mg/dL (ref 0.0–40.0)

## 2022-02-24 DIAGNOSIS — K5909 Other constipation: Secondary | ICD-10-CM | POA: Diagnosis not present

## 2022-02-24 DIAGNOSIS — Z800 Family history of malignant neoplasm of digestive organs: Secondary | ICD-10-CM | POA: Diagnosis not present

## 2022-02-24 DIAGNOSIS — K589 Irritable bowel syndrome without diarrhea: Secondary | ICD-10-CM | POA: Diagnosis not present

## 2022-03-02 DIAGNOSIS — H903 Sensorineural hearing loss, bilateral: Secondary | ICD-10-CM | POA: Diagnosis not present

## 2022-03-07 DIAGNOSIS — Z01411 Encounter for gynecological examination (general) (routine) with abnormal findings: Secondary | ICD-10-CM | POA: Diagnosis not present

## 2022-03-07 DIAGNOSIS — Z124 Encounter for screening for malignant neoplasm of cervix: Secondary | ICD-10-CM | POA: Diagnosis not present

## 2022-03-07 DIAGNOSIS — Z0142 Encounter for cervical smear to confirm findings of recent normal smear following initial abnormal smear: Secondary | ICD-10-CM | POA: Diagnosis not present

## 2022-03-07 DIAGNOSIS — Z6821 Body mass index (BMI) 21.0-21.9, adult: Secondary | ICD-10-CM | POA: Diagnosis not present

## 2022-03-07 DIAGNOSIS — Z01419 Encounter for gynecological examination (general) (routine) without abnormal findings: Secondary | ICD-10-CM | POA: Diagnosis not present

## 2022-03-22 DIAGNOSIS — H903 Sensorineural hearing loss, bilateral: Secondary | ICD-10-CM | POA: Diagnosis not present

## 2022-04-06 DIAGNOSIS — H35373 Puckering of macula, bilateral: Secondary | ICD-10-CM | POA: Diagnosis not present

## 2022-04-06 DIAGNOSIS — H04123 Dry eye syndrome of bilateral lacrimal glands: Secondary | ICD-10-CM | POA: Diagnosis not present

## 2022-04-06 DIAGNOSIS — Z961 Presence of intraocular lens: Secondary | ICD-10-CM | POA: Diagnosis not present

## 2022-04-06 DIAGNOSIS — H2512 Age-related nuclear cataract, left eye: Secondary | ICD-10-CM | POA: Diagnosis not present

## 2022-04-28 DIAGNOSIS — R5383 Other fatigue: Secondary | ICD-10-CM | POA: Diagnosis not present

## 2022-04-28 DIAGNOSIS — M810 Age-related osteoporosis without current pathological fracture: Secondary | ICD-10-CM | POA: Diagnosis not present

## 2022-04-28 DIAGNOSIS — E559 Vitamin D deficiency, unspecified: Secondary | ICD-10-CM | POA: Diagnosis not present

## 2022-05-03 DIAGNOSIS — K589 Irritable bowel syndrome without diarrhea: Secondary | ICD-10-CM | POA: Diagnosis not present

## 2022-05-03 DIAGNOSIS — Z800 Family history of malignant neoplasm of digestive organs: Secondary | ICD-10-CM | POA: Diagnosis not present

## 2022-05-03 DIAGNOSIS — K5909 Other constipation: Secondary | ICD-10-CM | POA: Diagnosis not present

## 2022-05-04 DIAGNOSIS — M810 Age-related osteoporosis without current pathological fracture: Secondary | ICD-10-CM | POA: Diagnosis not present

## 2022-05-04 DIAGNOSIS — M170 Bilateral primary osteoarthritis of knee: Secondary | ICD-10-CM | POA: Diagnosis not present

## 2022-05-05 DIAGNOSIS — Z1231 Encounter for screening mammogram for malignant neoplasm of breast: Secondary | ICD-10-CM | POA: Diagnosis not present

## 2022-07-26 DIAGNOSIS — M1711 Unilateral primary osteoarthritis, right knee: Secondary | ICD-10-CM | POA: Diagnosis not present

## 2022-08-02 DIAGNOSIS — M1711 Unilateral primary osteoarthritis, right knee: Secondary | ICD-10-CM | POA: Diagnosis not present

## 2022-08-09 DIAGNOSIS — M1711 Unilateral primary osteoarthritis, right knee: Secondary | ICD-10-CM | POA: Diagnosis not present

## 2022-09-07 DIAGNOSIS — H04123 Dry eye syndrome of bilateral lacrimal glands: Secondary | ICD-10-CM | POA: Diagnosis not present

## 2022-09-07 DIAGNOSIS — Z961 Presence of intraocular lens: Secondary | ICD-10-CM | POA: Diagnosis not present

## 2022-09-07 DIAGNOSIS — H2512 Age-related nuclear cataract, left eye: Secondary | ICD-10-CM | POA: Diagnosis not present

## 2022-09-23 ENCOUNTER — Telehealth: Payer: Self-pay

## 2022-09-23 NOTE — Telephone Encounter (Signed)
Last prolia inj 05/04/22 Next Prolia inj due 11/03/22  Loss of height Family hx of osteoporosis Hx bilat knee surgery Adult fractures: right tibia, right clavicle, ribs, pelvis Meds: Zoloft, Tamsulosin Treatments: Calcium + D, Prolia DEXA 10/13/21 T-score -2.4 RFN; 6-9% improvement

## 2022-09-26 ENCOUNTER — Encounter: Payer: Self-pay | Admitting: Family Medicine

## 2022-09-26 ENCOUNTER — Ambulatory Visit (INDEPENDENT_AMBULATORY_CARE_PROVIDER_SITE_OTHER): Payer: PPO | Admitting: Family Medicine

## 2022-09-26 VITALS — BP 122/78 | Ht 70.25 in | Wt 146.5 lb

## 2022-09-26 DIAGNOSIS — M810 Age-related osteoporosis without current pathological fracture: Secondary | ICD-10-CM | POA: Diagnosis not present

## 2022-09-26 DIAGNOSIS — E785 Hyperlipidemia, unspecified: Secondary | ICD-10-CM
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
## 2022-09-28 NOTE — Telephone Encounter (Signed)
Prolia VOB initiated via [email or web address]  Last prolia inj 05/04/22 Next Prolia inj due 11/03/22   Loss of height Family hx of osteoporosis Hx bilat knee surgery Adult fractures: right tibia, right clavicle, ribs, pelvis Meds: Zoloft, Tamsulosin Treatments: Calcium + D, Prolia DEXA 10/13/21 T-score -2.4 RFN; 6-9% improvement

## 2022-09-29 NOTE — Telephone Encounter (Signed)
Pt ready for scheduling on or after 08/29/22  Out-of-pocket cost due at time of visit: $327  Primary: [employer] Adv Medicare Adv Prolia co-insurance: 20% (approximately $302) Admin fee co-insurance: 20% (approximately $25)  Deductible: does not apply  Secondary: n/a Prolia co-insurance:  Admin fee co-insurance:   Deductible: does not apply  Prior Auth: NOT required PA# Valid:   ** This summary of benefits is an estimation of the patient's out-of-pocket cost. Exact cost may vary based on individual plan coverage.

## 2022-09-29 NOTE — Telephone Encounter (Signed)
Pt is ready for scheduling on or after 11/03/22   Out-of-pocket cost due at time of visit: $327   Primary: [employer] Adv Medicare Adv Prolia co-insurance: 20% (approximately $302) Admin fee co-insurance: 20% (approximately $25)   Deductible: does not apply   Secondary: n/a Prolia co-insurance:  Admin fee co-insurance:    Deductible: does not apply   Prior Auth: NOT required PA# Valid:    ** This summary of benefits is an estimation of the patient's out-of-pocket cost. Exact cost may vary based on individual plan coverage.

## 2022-10-02 ENCOUNTER — Other Ambulatory Visit: Payer: Self-pay | Admitting: Family Medicine

## 2022-10-02 DIAGNOSIS — F418 Other specified anxiety disorders: Secondary | ICD-10-CM

## 2022-10-02 DIAGNOSIS — F439 Reaction to severe stress, unspecified: Secondary | ICD-10-CM

## 2022-10-26 DIAGNOSIS — E785 Hyperlipidemia, unspecified: Secondary | ICD-10-CM | POA: Diagnosis not present

## 2022-10-27 LAB — LIPID PANEL
Chol/HDL Ratio: 2.9 ratio (ref 0.0–4.4)
Cholesterol, Total: 188 mg/dL (ref 100–199)
HDL: 64 mg/dL (ref >39)
LDL Chol Calc (NIH): 110 mg/dL — ABNORMAL HIGH (ref 0–99)
Triglycerides: 76 mg/dL (ref 0–149)
VLDL Cholesterol Cal: 14 mg/dL (ref 5–40)

## 2022-10-27 LAB — BASIC METABOLIC PANEL
BUN/Creatinine Ratio: 20 (ref 12–28)
BUN: 16 mg/dL (ref 8–27)
CO2: 23 mmol/L (ref 20–29)
Calcium: 8.7 mg/dL (ref 8.7–10.3)
Chloride: 106 mmol/L (ref 96–106)
Creatinine, Ser: 0.81 mg/dL (ref 0.57–1.00)
Glucose: 87 mg/dL (ref 70–99)
Potassium: 4.1 mmol/L (ref 3.5–5.2)
Sodium: 143 mmol/L (ref 134–144)
eGFR: 77 mL/min/{1.73_m2} (ref >59)

## 2022-10-31 DIAGNOSIS — M5431 Sciatica, right side: Secondary | ICD-10-CM | POA: Diagnosis not present

## 2022-10-31 DIAGNOSIS — M9905 Segmental and somatic dysfunction of pelvic region: Secondary | ICD-10-CM | POA: Diagnosis not present

## 2022-10-31 DIAGNOSIS — M9906 Segmental and somatic dysfunction of lower extremity: Secondary | ICD-10-CM | POA: Diagnosis not present

## 2022-10-31 DIAGNOSIS — M9903 Segmental and somatic dysfunction of lumbar region: Secondary | ICD-10-CM | POA: Diagnosis not present

## 2022-11-02 DIAGNOSIS — M9906 Segmental and somatic dysfunction of lower extremity: Secondary | ICD-10-CM | POA: Diagnosis not present

## 2022-11-02 DIAGNOSIS — M5431 Sciatica, right side: Secondary | ICD-10-CM | POA: Diagnosis not present

## 2022-11-02 DIAGNOSIS — M9903 Segmental and somatic dysfunction of lumbar region: Secondary | ICD-10-CM | POA: Diagnosis not present

## 2022-11-02 DIAGNOSIS — M9905 Segmental and somatic dysfunction of pelvic region: Secondary | ICD-10-CM | POA: Diagnosis not present

## 2022-11-03 ENCOUNTER — Ambulatory Visit: Payer: PPO | Admitting: Sports Medicine

## 2022-11-03 VITALS — Ht 70.25 in

## 2022-11-03 DIAGNOSIS — M810 Age-related osteoporosis without current pathological fracture: Secondary | ICD-10-CM | POA: Diagnosis not present

## 2022-11-03 MED ORDER — DENOSUMAB 60 MG/ML ~~LOC~~ SOSY
60.0000 mg | PREFILLED_SYRINGE | Freq: Once | SUBCUTANEOUS | Status: AC
  Administered 2022-11-03: 60 mg via SUBCUTANEOUS

## 2022-11-03 NOTE — Progress Notes (Signed)
Patient given [place] prolia injection '60mg'$ /ml in her left arm. Patient tolerated injection well without reaction at the injection site. Patient will schedule next injection, which is 6 months from today.

## 2022-11-08 DIAGNOSIS — M9906 Segmental and somatic dysfunction of lower extremity: Secondary | ICD-10-CM | POA: Diagnosis not present

## 2022-11-08 DIAGNOSIS — M9903 Segmental and somatic dysfunction of lumbar region: Secondary | ICD-10-CM | POA: Diagnosis not present

## 2022-11-08 DIAGNOSIS — M5431 Sciatica, right side: Secondary | ICD-10-CM | POA: Diagnosis not present

## 2022-11-08 DIAGNOSIS — M9905 Segmental and somatic dysfunction of pelvic region: Secondary | ICD-10-CM | POA: Diagnosis not present

## 2022-11-11 DIAGNOSIS — M9906 Segmental and somatic dysfunction of lower extremity: Secondary | ICD-10-CM | POA: Diagnosis not present

## 2022-11-11 DIAGNOSIS — M5431 Sciatica, right side: Secondary | ICD-10-CM | POA: Diagnosis not present

## 2022-11-11 DIAGNOSIS — M9903 Segmental and somatic dysfunction of lumbar region: Secondary | ICD-10-CM | POA: Diagnosis not present

## 2022-11-11 DIAGNOSIS — M9905 Segmental and somatic dysfunction of pelvic region: Secondary | ICD-10-CM | POA: Diagnosis not present

## 2022-11-16 NOTE — Telephone Encounter (Signed)
Last Prolia inj 11/03/22 Next Prolia inj due 05/07/23

## 2022-11-20 ENCOUNTER — Other Ambulatory Visit: Payer: Self-pay | Admitting: Family Medicine

## 2022-11-20 DIAGNOSIS — E785 Hyperlipidemia, unspecified: Secondary | ICD-10-CM

## 2022-11-25 DIAGNOSIS — M5431 Sciatica, right side: Secondary | ICD-10-CM | POA: Diagnosis not present

## 2022-11-25 DIAGNOSIS — M9906 Segmental and somatic dysfunction of lower extremity: Secondary | ICD-10-CM | POA: Diagnosis not present

## 2022-11-25 DIAGNOSIS — M9905 Segmental and somatic dysfunction of pelvic region: Secondary | ICD-10-CM | POA: Diagnosis not present

## 2022-11-25 DIAGNOSIS — M9903 Segmental and somatic dysfunction of lumbar region: Secondary | ICD-10-CM | POA: Diagnosis not present

## 2022-11-28 DIAGNOSIS — L738 Other specified follicular disorders: Secondary | ICD-10-CM | POA: Diagnosis not present

## 2022-11-28 DIAGNOSIS — D220 Melanocytic nevi of lip: Secondary | ICD-10-CM | POA: Diagnosis not present

## 2022-11-28 DIAGNOSIS — D225 Melanocytic nevi of trunk: Secondary | ICD-10-CM | POA: Diagnosis not present

## 2022-11-28 DIAGNOSIS — L918 Other hypertrophic disorders of the skin: Secondary | ICD-10-CM | POA: Diagnosis not present

## 2022-11-28 DIAGNOSIS — L304 Erythema intertrigo: Secondary | ICD-10-CM | POA: Diagnosis not present

## 2022-11-28 DIAGNOSIS — L821 Other seborrheic keratosis: Secondary | ICD-10-CM | POA: Diagnosis not present

## 2022-11-28 DIAGNOSIS — L720 Epidermal cyst: Secondary | ICD-10-CM | POA: Diagnosis not present

## 2022-11-30 DIAGNOSIS — M1712 Unilateral primary osteoarthritis, left knee: Secondary | ICD-10-CM | POA: Diagnosis not present

## 2022-12-05 DIAGNOSIS — M5431 Sciatica, right side: Secondary | ICD-10-CM | POA: Diagnosis not present

## 2022-12-05 DIAGNOSIS — M9903 Segmental and somatic dysfunction of lumbar region: Secondary | ICD-10-CM | POA: Diagnosis not present

## 2022-12-05 DIAGNOSIS — M9905 Segmental and somatic dysfunction of pelvic region: Secondary | ICD-10-CM | POA: Diagnosis not present

## 2022-12-05 DIAGNOSIS — M9906 Segmental and somatic dysfunction of lower extremity: Secondary | ICD-10-CM | POA: Diagnosis not present

## 2022-12-06 ENCOUNTER — Telehealth: Payer: Self-pay | Admitting: Family Medicine

## 2022-12-06 NOTE — Telephone Encounter (Signed)
Called patient to schedule Medicare Annual Wellness Visit (AWV). Left message for patient to call back and schedule Medicare Annual Wellness Visit (AWV).  Last date of AWV: 12/14/2021   Please schedule an AWVS appointment at any time with [facility] ANNUAL WELLNESS VISIT.  If any questions, please contact me at [phone].    Thank you,  [facility] Support [facility] Direct dial  [phone]

## 2022-12-07 ENCOUNTER — Telehealth: Payer: Self-pay | Admitting: Family Medicine

## 2022-12-07 NOTE — Addendum Note (Signed)
Addended by: Annita Brod on: 12/07/2022 04:22 PM   Modules accepted: Orders

## 2022-12-07 NOTE — Telephone Encounter (Signed)
Contacted Danyell Shader Sparks-Baumgartner to schedule their annual wellness visit. Appointment made for 12/08/2022.  Thank you,  [facility] Support [facility] Direct dial  [phone]

## 2022-12-08 ENCOUNTER — Ambulatory Visit (INDEPENDENT_AMBULATORY_CARE_PROVIDER_SITE_OTHER): Payer: PPO | Admitting: *Deleted

## 2022-12-08 DIAGNOSIS — Z0000 Encounter for general adult medical examination without abnormal findings: Secondary | ICD-10-CM | POA: Diagnosis not present

## 2022-12-08 NOTE — Progress Notes (Signed)
Subjective:   Erika Johns is a 72 y.o. female who presents for Medicare Annual (Subsequent) preventive examination.  I connected with  Erika Johns on 12/08/22 by a video enabled telemedicine application and verified that I am speaking with the correct person using two identifiers.   I discussed the limitations of evaluation and management by telemedicine. The patient expressed understanding and agreed to proceed.  Patient location: home  Provider location: in office   Review of Systems     Cardiac Risk Factors include: advanced age (>66men, >61 women)     Objective:    Today's Vitals   There is no height or weight on file to calculate BMI.     12/08/2022   11:31 AM 12/14/2021    8:27 AM 10/20/2021    2:01 PM 03/10/2020    8:19 AM 11/26/2018   10:09 AM 04/27/2017    3:38 PM 04/03/2017    9:50 AM  Advanced Directives  Does Patient Have a Medical Advance Directive? Yes Yes Yes Yes Yes Yes No  Type of Estate agent of [employer] Power of [place];Living will  Healthcare Power of [place];Living will Healthcare Power of [place];Living will Healthcare Power of [place];Living will   Does patient want to make changes to medical advance directive?   No - Patient declined  No - Patient declined    Copy of Healthcare Power of Attorney in Chart? No - copy requested No - copy requested  No - copy requested No - copy requested No - copy requested   Would patient like information on creating a medical advance directive?       No - Patient declined    Current Medications (verified) Outpatient Encounter Medications as of 12/08/2022  Medication Sig   atorvastatin (LIPITOR) 40 MG tablet TAKE 1 TABLET BY MOUTH EVERY OTHER DAY   cetirizine (ZYRTEC) 10 MG tablet Take 1 tablet (10 mg total) by mouth at bedtime.   Cholecalciferol (VITAMIN D3) 1000 UNITS CAPS Take by mouth daily.   clonazePAM (KLONOPIN) 0.5 MG tablet TAKE 0.5-1 TABLETS  (0.25-0.5 MG TOTAL) BY MOUTH 2 (TWO) TIMES DAILY AS NEEDED FOR ANXIETY.   denosumab (PROLIA) 60 MG/ML SOSY injection Inject 60 mg into the skin every 6 (six) months.   diclofenac sodium (VOLTAREN) 1 % GEL Apply 2 g topically 4 (four) times daily as needed (left knee pain).   glucosamine-chondroitin 500-400 MG tablet Take 1 tablet by mouth. 2 gel caps at breakfast   hydrocortisone (ANUSOL-HC) 2.5 % rectal cream Place 1 application rectally 2 (two) times daily. As needed short term for itching   OVER THE COUNTER MEDICATION    sertraline (ZOLOFT) 50 MG tablet TAKE 0.5-1 TABLETS (25-50 MG TOTAL) BY MOUTH DAILY.   Suvorexant (BELSOMRA) 10 MG TABS Take 5-10 mg by mouth at bedtime as needed.   tamsulosin (FLOMAX) 0.4 MG CAPS capsule Take 0.4 mg by mouth.   UNABLE TO FIND Med Name: TUMERIC   UNABLE TO FIND Med Name: Murtis Sink   vitamin k 100 MCG tablet Take 100 mcg by mouth daily.   benzonatate (TESSALON) 100 MG capsule Take 1 capsule (100 mg total) by mouth 2 (two) times daily as needed for cough. (Patient not taking: Reported on 12/14/2021)   fluticasone (FLONASE) 50 MCG/ACT nasal spray SPRAY 2 SPRAYS INTO EACH NOSTRIL EVERY DAY (Patient not taking: Reported on 12/30/2021)   No facility-administered encounter medications on file as of 12/08/2022.    Allergies (verified) Other, Effexor [venlafaxine hydrochloride],  Hydrocodone, and Ampicillin   History: Past Medical History:  Diagnosis Date   Allergy    Anxiety    Arthritis    Constipation, chronic    Depression    Hyperlipidemia    Phreesia 03/07/2020   IBS (irritable bowel syndrome)    Insomnia    PONV (postoperative nausea and vomiting)    severe n/v-needs scop   Past Surgical History:  Procedure Laterality Date   COLONOSCOPY     DILATION AND CURETTAGE OF UTERUS  2006   FRACTURE SURGERY  1996   fx [id number]   HARDWARE REMOVAL  1996   rt ankle   KNEE ARTHROSCOPY  2003   lt   KNEE ARTHROSCOPY     right   ORIF CLAVICULAR FRACTURE  Right 05/28/2013   Procedure: OPEN REDUCTION INTERNAL FIXATION (ORIF) CLAVICULAR FRACTURE;  Surgeon: Sheral Apley, MD;  Location: [facility];  Service: Orthopedics;  Laterality: Right;   TONSILLECTOMY     History reviewed. No pertinent family history. Social History   Socioeconomic History   Marital status: Widowed    Spouse name: Not on file   Number of children: Not on file   Years of education: Not on file   Highest education level: Not on file  Occupational History   Not on file  Tobacco Use   Smoking status: Never   Smokeless tobacco: Never  Substance and Sexual Activity   Alcohol use: Yes    Alcohol/week: 1.0 standard drink of alcohol    Types: 1 Glasses of wine per week    Comment: rare   Drug use: No   Sexual activity: Not Currently  Other Topics Concern   Not on file  Social History Narrative   Not on file   Social Determinants of Health   Financial Resource Strain: Low Risk  (12/08/2022)   Overall Financial Resource Strain (CARDIA)    Difficulty of Paying Living Expenses: Not very hard  Food Insecurity: No Food Insecurity (12/08/2022)   Hunger Vital Sign    Worried About Running Out of Food in the Last Year: Never true    Ran Out of Food in the Last Year: Never true  Transportation Needs: No Transportation Needs (12/08/2022)   PRAPARE - Administrator, Civil Service (Medical): No    Lack of Transportation (Non-Medical): No  Physical Activity: Sufficiently Active (12/08/2022)   Exercise Vital Sign    Days of Exercise per Week: 4 days    Minutes of Exercise per Session: 50 min  Stress: No Stress Concern Present (12/08/2022)   [employer] of Occupational Health - Occupational Stress Questionnaire    Feeling of Stress : Not at all  Social Connections: Moderately Isolated (12/08/2022)   Social Connection and Isolation Panel [NHANES]    Frequency of Communication with Friends and Family: Twice a week    Frequency of Social  Gatherings with Friends and Family: Three times a week    Attends Religious Services: Never    Active Member of Clubs or Organizations: Yes    Attends Banker Meetings: More than 4 times per year    Marital Status: Widowed    Tobacco Counseling Counseling given: Not Answered   Clinical Intake:  Pre-visit preparation completed: Yes  Pain : No/denies pain     Diabetes: No  How often do you need to have someone help you when you read instructions, pamphlets, or other written materials from your doctor or pharmacy?: 1 - Never  Diabetic?  no  Interpreter Needed?: No  Information entered by :: Remi Haggard LPN   Activities of Daily Living    12/08/2022   11:14 AM 12/07/2022   10:03 PM  In your present state of health, do you have any difficulty performing the following activities:  Hearing? 1 0  Vision? 0 0  Difficulty concentrating or making decisions? 0 0  Walking or climbing stairs? 0 0  Dressing or bathing?  0  Doing errands, shopping? 0 0  Preparing Food and eating ? N N  Using the Toilet? N N  In the past six months, have you accidently leaked urine? Y Y  Do you have problems with loss of bowel control? N N  Managing your Medications? N N  Managing your Finances? N N  Housekeeping or managing your Housekeeping?  N    Patient Care Team: Shade Flood, MD as PCP - General (Family Medicine) Specialists, [facility] (Orthopedic Surgery) Dannielle Huh, MD as Consulting Physician (Orthopedic Surgery) Alfredo Martinez, MD as Consulting Physician (Urology) Donzetta Starch, MD as Consulting Physician (Dermatology) Elinor Parkinson, DPM as Consulting Physician (Podiatry) Noland Fordyce, MD as Consulting Physician (Obstetrics and Gynecology)  Indicate any recent Medical Services you may have received from other than [facility] providers in the past year (date may be approximate).     Assessment:   This is a routine wellness examination for  Tira.  Hearing/Vision screen Hearing Screening - Comments:: Hearing aids bilateral Vision Screening - Comments:: Up to date digby  Dietary issues and exercise activities discussed: Current Exercise Habits: Structured exercise class, Type of exercise: yoga;stretching;Other - see comments (bycycle riding), Time (Minutes): 50, Frequency (Times/Week): 4, Weekly Exercise (Minutes/Week): 200, Intensity: Moderate   Goals Addressed             This Visit's Progress    Patient Stated   On track    Would like to stretch everyday     Patient Stated       Lower cholesterol       Depression Screen    12/08/2022   11:19 AM 12/30/2021    9:45 AM 12/14/2021    8:31 AM 12/14/2021    8:25 AM 07/08/2021    4:27 PM 11/09/2020    3:32 PM 08/11/2020    3:17 PM  PHQ 2/9 Scores  PHQ - 2 Score 0 1 0 0 1 0 0  PHQ- 9 Score Fall Risk    12/08/2022   11:04 AM 12/07/2022   10:03 PM 12/30/2021    9:45 AM 12/14/2021    8:28 AM 10/19/2021   10:38 AM  Fall Risk   Falls in the past year?  0 0 0 0  Number falls in past yr: 0  0 0 0  Injury with Fall? 0  0 0 0  Risk for fall due to :   No Fall Risks  No Fall Risks  Follow up Falls evaluation completed;Education provided;Falls prevention discussed  Falls evaluation completed Falls evaluation completed Falls evaluation completed    FALL RISK PREVENTION PERTAINING TO THE HOME:  Any stairs in or around the home? No  If so, are there any without handrails? No  Home free of loose throw rugs in walkways, pet beds, electrical cords, etc? Yes  Adequate lighting in your home to reduce risk of falls? Yes   ASSISTIVE DEVICES UTILIZED TO PREVENT FALLS:  Life alert? No  Use of  a cane, walker or w/c? No  Grab bars in the bathroom? Yes  Shower chair or bench in shower? No  Elevated toilet seat or a handicapped toilet? Yes   TIMED UP AND GO:  Was the test performed? No .     Cognitive Function:        12/08/2022   11:15 AM 10/20/2021     1:58 PM 03/10/2020    8:20 AM 11/26/2018   10:11 AM 04/27/2017    3:52 PM  6CIT Screen  What Year? 0 points 0 points 0 points 0 points 0 points  What month? 0 points 0 points 0 points 0 points 0 points  What time? 0 points 0 points 0 points 0 points 0 points  Count back from 20 0 points 0 points 0 points 0 points 0 points  Months in reverse 0 points 0 points 0 points 0 points 0 points  Repeat phrase 0 points 0 points 0 points 0 points 0 points  Total Score 0 points 0 points 0 points 0 points 0 points    Immunizations Immunization History  Administered Date(s) Administered   Influenza Split 04/28/2012, 05/22/2013, 06/05/2014   Influenza, High Dose Seasonal PF 06/13/2017, 05/24/2018, 05/24/2018, 05/18/2019, 05/18/2019, 05/11/2020   Influenza,inj,Quad PF,6+ Mos 05/12/2016   Influenza-Unspecified 05/25/2015, 06/13/2017, 05/26/2021   Moderna Sars-Covid-2 Vaccination 09/23/2019, 09/23/2019, 10/22/2019, 06/19/2020   PFIZER(Purple Top)SARS-COV-2 Vaccination 11/28/2020, 05/13/2021   Pneumococcal Conjugate-13 01/12/2015, 05/31/2015   Pneumococcal Polysaccharide-23 09/27/2016   Td 11/22/2007   Tdap 03/22/2018   Zoster Recombinat (Shingrix) 07/23/2018, 11/08/2018   Zoster, Live 06/22/2010    TDAP status: Up to date  Flu Vaccine status: Due, Education has been provided regarding the importance of this vaccine. Advised may receive this vaccine at local pharmacy or Health Dept. Aware to provide a copy of the vaccination record if obtained from local pharmacy or Health Dept. Verbalized acceptance and understanding.  Pneumococcal vaccine status: Up to date  Covid-19 vaccine status: Completed vaccines  Qualifies for Shingles Vaccine? No   Zostavax completed Yes   Shingrix Completed?: Yes  Screening Tests Health Maintenance  Topic Date Due   COVID-19 Vaccine (8 - 2023-24 season) 12/24/2022 (Originally 04/22/2022)   INFLUENZA VACCINE  03/23/2023   MAMMOGRAM  05/06/2023   Medicare Annual  Wellness (AWV)  12/08/2023   DTaP/Tdap/Td (3 - Td or Tdap) 03/22/2028   COLONOSCOPY (Pts 45-88yrs Insurance coverage will need to be confirmed)  09/06/2028   Pneumonia Vaccine 45+ Years old  Completed   DEXA SCAN  Completed   Hepatitis C Screening  Completed   Zoster Vaccines- Shingrix  Completed   HPV VACCINES  Aged Out    Health Maintenance  There are no preventive care reminders to display for this patient.   Colorectal cancer screening: Type of screening: Colonoscopy. Completed 2021. Repeat every 5 years  Mammogram status: Completed 2023. Repeat every year  Bone Density status: Completed 2023. Results reflect: Bone density results: OSTEOPOROSIS. Repeat every 2 years.  Lung Cancer Screening: (Low Dose CT Chest recommended if Age 58-80 years, 30 pack-year currently smoking OR have quit w/in 15years.) does not qualify.   Lung Cancer Screening Referral:   Additional Screening:  Hepatitis C Screening: does not qualify; Completed 2017  Vision Screening: Recommended annual ophthalmology exams for early detection of glaucoma and other disorders of the eye. Is the patient up to date with their annual eye exam?  Yes  Who is the provider or what is the name of the office in which  the patient attends annual eye exams? [facility] If pt is not established with a provider, would they like to be referred to a provider to establish care? No .   Dental Screening: Recommended annual dental exams for proper oral hygiene  Community Resource Referral / Chronic Care Management: CRR required this visit?  No   CCM required this visit?  No      Plan:     I have personally reviewed and noted the following in the patient's chart:   Medical and social history Use of alcohol, tobacco or illicit drugs  Current medications and supplements including opioid prescriptions. Patient is not currently taking opioid prescriptions. Functional ability and status Nutritional status Physical  activity Advanced directives List of other physicians Hospitalizations, surgeries, and ER visits in previous 12 months Vitals Screenings to include cognitive, depression, and falls Referrals and appointments  In addition, I have reviewed and discussed with patient certain preventive protocols, quality metrics, and best practice recommendations. A written personalized care plan for preventive services as well as general preventive health recommendations were provided to patient.     Remi Haggard, LPN   [id number]/61/0960   Nurse Notes:

## 2022-12-08 NOTE — Patient Instructions (Signed)
Erika Johns , Thank you for taking time to come for your Medicare Wellness Visit. I appreciate your ongoing commitment to your health goals. Please review the following plan we discussed and let me know if I can assist you in the future.   Screening recommendations/referrals: Colonoscopy: up to date Mammogram: up to date Bone Density: up to date Recommended yearly ophthalmology/optometry visit for glaucoma screening and checkup Recommended yearly dental visit for hygiene and checkup  Vaccinations: Influenza vaccine: up to date Pneumococcal vaccine: up to date Tdap vaccine: up to date Shingles vaccine: up to date      Preventive Care 65 Years and Older, Female Preventive care refers to lifestyle choices and visits with your health care provider that can promote health and wellness. What does preventive care include? A yearly physical exam. This is also called an annual well check. Dental exams once or twice a year. Routine eye exams. Ask your health care provider how often you should have your eyes checked. Personal lifestyle choices, including: Daily care of your teeth and gums. Regular physical activity. Eating a healthy diet. Avoiding tobacco and drug use. Limiting alcohol use. Practicing safe sex. Taking low-dose aspirin every day. Taking vitamin and mineral supplements as recommended by your health care provider. What happens during an annual well check? The services and screenings done by your health care provider during your annual well check will depend on your age, overall health, lifestyle risk factors, and family history of disease. Counseling  Your health care provider may ask you questions about your: Alcohol use. Tobacco use. Drug use. Emotional well-being. Home and relationship well-being. Sexual activity. Eating habits. History of falls. Memory and ability to understand (cognition). Work and work Astronomer. Reproductive health. Screening   You may have the following tests or measurements: Height, weight, and BMI. Blood pressure. Lipid and cholesterol levels. These may be checked every 5 years, or more frequently if you are over 4 years old. Skin check. Lung cancer screening. You may have this screening every year starting at age 57 if you have a 30-pack-year history of smoking and currently smoke or have quit within the past 15 years. Fecal occult blood test (FOBT) of the stool. You may have this test every year starting at age 78. Flexible sigmoidoscopy or colonoscopy. You may have a sigmoidoscopy every 5 years or a colonoscopy every 10 years starting at age 18. Hepatitis C blood test. Hepatitis B blood test. Sexually transmitted disease (STD) testing. Diabetes screening. This is done by checking your blood sugar (glucose) after you have not eaten for a while (fasting). You may have this done every 1-3 years. Bone density scan. This is done to screen for osteoporosis. You may have this done starting at age 61. Mammogram. This may be done every 1-2 years. Talk to your health care provider about how often you should have regular mammograms. Talk with your health care provider about your test results, treatment options, and if necessary, the need for more tests. Vaccines  Your health care provider may recommend certain vaccines, such as: Influenza vaccine. This is recommended every year. Tetanus, diphtheria, and acellular pertussis (Tdap, Td) vaccine. You may need a Td booster every 10 years. Zoster vaccine. You may need this after age 51. Pneumococcal 13-valent conjugate (PCV13) vaccine. One dose is recommended after age 64. Pneumococcal polysaccharide (PPSV23) vaccine. One dose is recommended after age 50. Talk to your health care provider about which screenings and vaccines you need and how often you need them. This  information is not intended to replace advice given to you by your health care provider. Make sure you discuss  any questions you have with your health care provider. Document Released: 09/04/2015 Document Revised: 04/27/2016 Document Reviewed: 06/09/2015 Elsevier Interactive Patient Education  2017 [employer].  Fall Prevention in the Home Falls can cause injuries. They can happen to people of all ages. There are many things you can do to make your home safe and to help prevent falls. What can I do on the outside of my home? Regularly fix the edges of walkways and driveways and fix any cracks. Remove anything that might make you trip as you walk through a door, such as a raised step or threshold. Trim any bushes or trees on the path to your home. Use bright outdoor lighting. Clear any walking paths of anything that might make someone trip, such as rocks or tools. Regularly check to see if handrails are loose or broken. Make sure that both sides of any steps have handrails. Any raised decks and porches should have guardrails on the edges. Have any leaves, snow, or ice cleared regularly. Use sand or salt on walking paths during winter. Clean up any spills in your garage right away. This includes oil or grease spills. What can I do in the bathroom? Use night lights. Install grab bars by the toilet and in the tub and shower. Do not use towel bars as grab bars. Use non-skid mats or decals in the tub or shower. If you need to sit down in the shower, use a plastic, non-slip stool. Keep the floor dry. Clean up any water that spills on the floor as soon as it happens. Remove soap buildup in the tub or shower regularly. Attach bath mats securely with double-sided non-slip rug tape. Do not have throw rugs and other things on the floor that can make you trip. What can I do in the bedroom? Use night lights. Make sure that you have a light by your bed that is easy to reach. Do not use any sheets or blankets that are too big for your bed. They should not hang down onto the floor. Have a firm chair that has  side arms. You can use this for support while you get dressed. Do not have throw rugs and other things on the floor that can make you trip. What can I do in the kitchen? Clean up any spills right away. Avoid walking on wet floors. Keep items that you use a lot in easy-to-reach places. If you need to reach something above you, use a strong step stool that has a grab bar. Keep electrical cords out of the way. Do not use floor polish or wax that makes floors slippery. If you must use wax, use non-skid floor wax. Do not have throw rugs and other things on the floor that can make you trip. What can I do with my stairs? Do not leave any items on the stairs. Make sure that there are handrails on both sides of the stairs and use them. Fix handrails that are broken or loose. Make sure that handrails are as long as the stairways. Check any carpeting to make sure that it is firmly attached to the stairs. Fix any carpet that is loose or worn. Avoid having throw rugs at the top or bottom of the stairs. If you do have throw rugs, attach them to the floor with carpet tape. Make sure that you have a light switch at the top  of the stairs and the bottom of the stairs. If you do not have them, ask someone to add them for you. What else can I do to help prevent falls? Wear shoes that: Do not have high heels. Have rubber bottoms. Are comfortable and fit you well. Are closed at the toe. Do not wear sandals. If you use a stepladder: Make sure that it is fully opened. Do not climb a closed stepladder. Make sure that both sides of the stepladder are locked into place. Ask someone to hold it for you, if possible. Clearly mark and make sure that you can see: Any grab bars or handrails. First and last steps. Where the edge of each step is. Use tools that help you move around (mobility aids) if they are needed. These include: Canes. Walkers. Scooters. Crutches. Turn on the lights when you go into a dark area.  Replace any light bulbs as soon as they burn out. Set up your furniture so you have a clear path. Avoid moving your furniture around. If any of your floors are uneven, fix them. If there are any pets around you, be aware of where they are. Review your medicines with your doctor. Some medicines can make you feel dizzy. This can increase your chance of falling. Ask your doctor what other things that you can do to help prevent falls. This information is not intended to replace advice given to you by your health care provider. Make sure you discuss any questions you have with your health care provider. Document Released: 06/04/2009 Document Revised: 01/14/2016 Document Reviewed: 09/12/2014 Elsevier Interactive Patient Education  2017 [employer].

## 2022-12-14 DIAGNOSIS — M9905 Segmental and somatic dysfunction of pelvic region: Secondary | ICD-10-CM | POA: Diagnosis not present

## 2022-12-14 DIAGNOSIS — M9906 Segmental and somatic dysfunction of lower extremity: Secondary | ICD-10-CM | POA: Diagnosis not present

## 2022-12-14 DIAGNOSIS — M9903 Segmental and somatic dysfunction of lumbar region: Secondary | ICD-10-CM | POA: Diagnosis not present

## 2022-12-14 DIAGNOSIS — M5431 Sciatica, right side: Secondary | ICD-10-CM | POA: Diagnosis not present

## 2022-12-28 ENCOUNTER — Ambulatory Visit (INDEPENDENT_AMBULATORY_CARE_PROVIDER_SITE_OTHER): Payer: PPO | Admitting: Family Medicine

## 2022-12-28 ENCOUNTER — Encounter: Payer: Self-pay | Admitting: Family Medicine

## 2022-12-28 VITALS — BP 112/68 | HR 72 | Temp 98.7°F | Ht 70.0 in | Wt 146.8 lb

## 2022-12-28 DIAGNOSIS — F439 Reaction to severe stress, unspecified: Secondary | ICD-10-CM | POA: Diagnosis not present

## 2022-12-28 DIAGNOSIS — G4700 Insomnia, unspecified: Secondary | ICD-10-CM

## 2022-12-28 DIAGNOSIS — R062 Wheezing: Secondary | ICD-10-CM

## 2022-12-28 DIAGNOSIS — T7840XD Allergy, unspecified, subsequent encounter: Secondary | ICD-10-CM

## 2022-12-28 DIAGNOSIS — E785 Hyperlipidemia, unspecified: Secondary | ICD-10-CM | POA: Diagnosis not present

## 2022-12-28 DIAGNOSIS — F418 Other specified anxiety disorders: Secondary | ICD-10-CM

## 2022-12-28 MED ORDER — BELSOMRA 10 MG PO TABS: 5.0000 mg | ORAL_TABLET | Freq: Every evening | ORAL | 3 refills | Status: DC | PRN

## 2022-12-28 MED ORDER — CLONAZEPAM 0.5 MG PO TABS: 0.2500 mg | ORAL_TABLET | Freq: Two times a day (BID) | ORAL | 0 refills | Status: DC | PRN

## 2022-12-28 MED ORDER — ATORVASTATIN CALCIUM 40 MG PO TABS: 40.0000 mg | ORAL_TABLET | ORAL | 1 refills | Status: DC

## 2022-12-28 MED ORDER — ALBUTEROL SULFATE HFA 108 (90 BASE) MCG/ACT IN AERS: 1.0000 | INHALATION_SPRAY | Freq: Four times a day (QID) | RESPIRATORY_TRACT | 0 refills | Status: AC | PRN

## 2022-12-28 MED ORDER — FLUTICASONE PROPIONATE 50 MCG/ACT NA SUSP: NASAL | 6 refills | Status: AC

## 2022-12-28 MED ORDER — SERTRALINE HCL 50 MG PO TABS: 25.0000 mg | ORAL_TABLET | Freq: Every day | ORAL | 1 refills | Status: DC

## 2022-12-28 NOTE — Progress Notes (Signed)
Subjective:  Patient ID: Erika Johns, female    DOB: 08/10/1951  Age: 72 y.o. MRN: [id number]  CC:  Chief Complaint  Patient presents with   Hyperlipidemia   Insomnia    Pt states she needs refills and wants to discuss Zoloft     HPI Erika Johns presents for   Hyperlipidemia: Lipitor 40 mg daily, no new myalgias or side effects.  Recent lipids in March. Staying active. 4 yoga classes last week.  Lab Results  Component Value Date   CHOL 188 10/26/2022   HDL 64 10/26/2022   LDLCALC 110 (H) 10/26/2022   TRIG 76 10/26/2022   CHOLHDL 2.9 10/26/2022   Lab Results  Component Value Date   ALT 17 01/05/2022   AST 16 01/05/2022   ALKPHOS 40 01/05/2022   BILITOT 0.6 01/05/2022   Depression: Zoloft 25 mg daily,  still rare need for Klonopin to help get to sleep. Has sometimes used 1/2 belsomra - 3 times per month.  Melatonin, CBD Gummies for sleep.  Spent some time in [place] - mother passed in February. Doing ok.  Meeting with therapist every 6 weeks. 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 Controlled substance database reviewed.  Belsomra No. 30 on 12/30/2021 clonazepam No. 45 on 10/27/2021    12/28/2022    8:27 AM 12/08/2022   11:19 AM 12/30/2021    9:45 AM 12/14/2021    8:31 AM 12/14/2021    8:25 AM  Depression screen PHQ 2/9  Decreased Interest 0 0 0 0 0  Down, Depressed, Hopeless 0 0 1 0 0  PHQ - 2 Score 0 0 1 0 0  Altered sleeping 1 2 1     Tired, decreased energy 0 0 0    Change in appetite 0 0 0    Feeling bad or failure about yourself  0 0 0    Trouble concentrating 0 0 0    Moving slowly or fidgety/restless 0 0 0    Suicidal thoughts 0 0 0    PHQ-9 Score 1 2 2     Difficult doing work/chores Not difficult at all Not difficult at all      Hx of allergic rhinitis - uses flonase intermittently. Doing well recently. Possible slight wheeze after long ride, quickly resolves. No chest pain. Worse in cold weather. No recent use of inhaler - has used in  past.   Chronic constipation managed with miralax, citrucel.   History Patient Active Problem List   Diagnosis Date Noted   Bilateral hearing loss 05/20/2019   Bilateral impacted cerumen 05/20/2019   Pain in joint, ankle and foot 12/25/2017   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/03/2017   Achilles tendon contracture, right 05/03/2017   Eustachian tube dysfunction, left 04/04/2017   Sensorineural hearing loss (SNHL), bilateral 04/04/2017   Constipation 01/09/2014   Osteoarthritis of left knee 08/27/2009   LUMBAGO 08/27/2009   TROCHANTERIC BURSITIS 08/27/2009   HAND PAIN, LEFT 08/27/2009   Past Medical History:  Diagnosis Date   Allergy    Anxiety    Arthritis    Constipation, chronic    Depression    Hyperlipidemia    Phreesia 03/07/2020   IBS (irritable bowel syndrome)    Insomnia    PONV (postoperative nausea and vomiting)    severe n/v-needs scop   Past Surgical History:  Procedure Laterality Date   COLONOSCOPY     DILATION AND CURETTAGE OF UTERUS  2006   FRACTURE SURGERY  1996   fx tib-fib-rt  HARDWARE REMOVAL  1996   rt ankle   KNEE ARTHROSCOPY  2003   lt   KNEE ARTHROSCOPY     right   ORIF CLAVICULAR FRACTURE Right 05/28/2013   Procedure: OPEN REDUCTION INTERNAL FIXATION (ORIF) CLAVICULAR FRACTURE;  Surgeon: Sheral Apley, MD;  Location: [facility];  Service: Orthopedics;  Laterality: Right;   TONSILLECTOMY     Allergies  Allergen Reactions   Other Rash   Effexor [Venlafaxine Hydrochloride] Other (See Comments)    dizzy   Hydrocodone Nausea And Vomiting   Ampicillin Rash   Prior to Admission medications   Medication Sig Start Date End Date Taking? Authorizing Provider  atorvastatin (LIPITOR) 40 MG tablet TAKE 1 TABLET BY MOUTH EVERY OTHER DAY 11/21/22  Yes Sheliah Hatch, MD  cetirizine (ZYRTEC) 10 MG tablet Take 1 tablet (10 mg total) by mouth at bedtime. 08/11/20  Yes Just, Azalee Course, FNP  Cholecalciferol (VITAMIN D3) 1000 UNITS  CAPS Take by mouth daily.   Yes [provider]  clonazePAM (KLONOPIN) 0.5 MG tablet TAKE 0.5-1 TABLETS (0.25-0.5 MG TOTAL) BY MOUTH 2 (TWO) TIMES DAILY AS NEEDED FOR ANXIETY. 10/27/21  Yes Shade Flood, MD  denosumab (PROLIA) 60 MG/ML SOSY injection Inject 60 mg into the skin every 6 (six) months.   Yes Hudnall, Azucena Fallen, MD  diclofenac sodium (VOLTAREN) 1 % GEL Apply 2 g topically 4 (four) times daily as needed (left knee pain). 12/25/17  Yes Howard Pouch, MD  fluticasone (FLONASE) 50 MCG/ACT nasal spray SPRAY 2 SPRAYS INTO EACH NOSTRIL EVERY DAY 08/11/20  Yes Just, Azalee Course, FNP  glucosamine-chondroitin 500-400 MG tablet Take 1 tablet by mouth. 2 gel caps at breakfast   Yes [provider]  hydrocortisone (ANUSOL-HC) 2.5 % rectal cream Place 1 application rectally 2 (two) times daily. As needed short term for itching 11/05/19  Yes Shade Flood, MD  OVER THE COUNTER MEDICATION    Yes [provider]  sertraline (ZOLOFT) 50 MG tablet TAKE 0.5-1 TABLETS (25-50 MG TOTAL) BY MOUTH DAILY. 10/03/22  Yes Shade Flood, MD  Suvorexant (BELSOMRA) 10 MG TABS Take 5-10 mg by mouth at bedtime as needed. 12/30/21  Yes Shade Flood, MD  tamsulosin (FLOMAX) 0.4 MG CAPS capsule Take 0.4 mg by mouth.   Yes [provider]  UNABLE TO FIND Med Name: [facility]   Yes [provider]  UNABLE TO FIND Med Name: Murriel Hopper [provider]  vitamin k 100 MCG tablet Take 100 mcg by mouth daily.   Yes [provider]  benzonatate (TESSALON) 100 MG capsule Take 1 capsule (100 mg total) by mouth 2 (two) times daily as needed for cough. Patient not taking: Reported on 12/14/2021 10/19/21   Janeece Agee, NP   Social History   Socioeconomic History   Marital status: Widowed    Spouse name: Not on file   Number of children: Not on file   Years of education: Not on file   Highest education level: Bachelor's degree (e.g., BA, AB, BS)  Occupational  History   Not on file  Tobacco Use   Smoking status: Never   Smokeless tobacco: Never  Substance and Sexual Activity   Alcohol use: Yes    Alcohol/week: 1.0 standard drink of alcohol    Types: 1 Glasses of wine per week    Comment: rare   Drug use: No   Sexual activity: Not Currently  Other Topics Concern  Not on file  Social History Narrative   Not on file   Social Determinants of Health   Financial Resource Strain: Low Risk  (12/26/2022)   Overall Financial Resource Strain (CARDIA)    Difficulty of Paying Living Expenses: Not hard at all  Food Insecurity: No Food Insecurity (12/26/2022)   Hunger Vital Sign    Worried About Running Out of Food in the Last Year: Never true    Ran Out of Food in the Last Year: Never true  Transportation Needs: No Transportation Needs (12/26/2022)   PRAPARE - Administrator, Civil Service (Medical): No    Lack of Transportation (Non-Medical): No  Physical Activity: Sufficiently Active (12/26/2022)   Exercise Vital Sign    Days of Exercise per Week: 4 days    Minutes of Exercise per Session: 80 min  Stress: No Stress Concern Present (12/26/2022)   [employer] of Occupational Health - Occupational Stress Questionnaire    Feeling of Stress : Only a little  Social Connections: Moderately Integrated (12/26/2022)   Social Connection and Isolation Panel [NHANES]    Frequency of Communication with Friends and Family: More than three times a week    Frequency of Social Gatherings with Friends and Family: More than three times a week    Attends Religious Services: 1 to 4 times per year    Active Member of [employer] or Organizations: Yes    Attends Banker Meetings: More than 4 times per year    Marital Status: Widowed  Recent Concern: Social Connections - Moderately Isolated (12/08/2022)   Social Connection and Isolation Panel [NHANES]    Frequency of Communication with Friends and Family: Twice a week    Frequency of Social  Gatherings with Friends and Family: Three times a week    Attends Religious Services: Never    Active Member of Clubs or Organizations: Yes    Attends Banker Meetings: More than 4 times per year    Marital Status: Widowed  Intimate Partner Violence: Not At Risk (12/08/2022)   Humiliation, Afraid, Rape, and Kick questionnaire    Fear of Current or Ex-Partner: No    Emotionally Abused: No    Physically Abused: No    Sexually Abused: No    Review of Systems Per HPI.   Objective:   Vitals:   12/28/22 0827  BP: 112/68  Pulse: 72  Temp: 98.7 F (37.1 C)  TempSrc: Temporal  SpO2: 98%  Weight: 146 lb 12.8 oz (66.6 kg)  Height: 5\' 10"  (1.778 m)     Physical Exam Vitals reviewed.  Constitutional:      Appearance: Normal appearance. She is well-developed.  HENT:     Head: Normocephalic and atraumatic.  Eyes:     Conjunctiva/sclera: Conjunctivae normal.     Pupils: Pupils are equal, round, and reactive to light.  Neck:     Vascular: No carotid bruit.  Cardiovascular:     Rate and Rhythm: Normal rate and regular rhythm.     Heart sounds: Normal heart sounds.  Pulmonary:     Effort: Pulmonary effort is normal. No respiratory distress.     Breath sounds: Normal breath sounds. No wheezing or rales.  Abdominal:     Palpations: Abdomen is soft. There is no pulsatile mass.     Tenderness: There is no abdominal tenderness.  Musculoskeletal:     Right lower leg: No edema.     Left lower leg: No edema.  Skin:  General: Skin is warm and dry.  Neurological:     Mental Status: She is alert and oriented to person, place, and time.  Psychiatric:        Mood and Affect: Mood normal.        Behavior: Behavior normal.        Assessment & Plan:  Erika Johns is a 72 y.o. female . Wheeze - Plan: albuterol (VENTOLIN HFA) 108 (90 Base) MCG/ACT inhaler  -New concern, notices only at the end of her ride, worse in cold weather, and has used inhaler in the  past.  Suspected exercise-induced bronchospasm versus mild intermittent asthma.  Albuterol prescribed if needed with RTC precautions if frequent use or any insufficient results.  Depression with anxiety - Plan: clonazePAM (KLONOPIN) 0.5 MG tablet, sertraline (ZOLOFT) 50 MG tablet Situational stress - Plan: clonazePAM (KLONOPIN) 0.5 MG tablet, sertraline (ZOLOFT) 50 MG tablet  -Overall stable, including with new stressors, passing of her mother a few months ago, loss of other acquaintances.  Still having to care for her sister with some potential financial stressors.  Decided to continue same dose of Zoloft for now.  Continue meeting with therapist, RTC precautions, 39-month follow-up.  Klonopin if needed for increased anxiety or sleep, see below.  Insomnia, unspecified type - Plan: Suvorexant (BELSOMRA) 10 MG TABS  -Overall stable with various treatments including melatonin, CBD Gummies, infrequent use of Klonopin, infrequent use of Belsomra, not combining.  Continue same regimen, Belsomra, Klonopin refilled.  Allergy, subsequent encounter - Plan: fluticasone (FLONASE) 50 MCG/ACT nasal spray  -Infrequent need of Flonase, effective when used, refilled.  Hyperlipidemia, unspecified hyperlipidemia type - Plan: atorvastatin (LIPITOR) 40 MG tablet  -Recent lipids overall looked okay, slight elevated LDL but anticipate improvement during the summer.  Recheck in 6 months with other labs.  Meds ordered this encounter  Medications   clonazePAM (KLONOPIN) 0.5 MG tablet    Sig: Take 0.5-1 tablets (0.25-0.5 mg total) by mouth 2 (two) times daily as needed for anxiety.    Dispense:  45 tablet    Refill:  0    This request is for a new prescription for a controlled substance as required by Federal/State law. DX Code Needed  PATIENT IS REQUESTING MORE REFILLS.   Suvorexant (BELSOMRA) 10 MG TABS    Sig: Take 0.5-1 tablets (5-10 mg total) by mouth at bedtime as needed.    Dispense:  30 tablet    Refill:  3    fluticasone (FLONASE) 50 MCG/ACT nasal spray    Sig: SPRAY 2 SPRAYS INTO EACH NOSTRIL EVERY DAY    Dispense:  16 mL    Refill:  6    DX Code Needed  .   sertraline (ZOLOFT) 50 MG tablet    Sig: Take 0.5-1 tablets (25-50 mg total) by mouth daily.    Dispense:  90 tablet    Refill:  1   albuterol (VENTOLIN HFA) 108 (90 Base) MCG/ACT inhaler    Sig: Inhale 1-2 puffs into the lungs every 6 (six) hours as needed for wheezing or shortness of breath.    Dispense:  8 g    Refill:  0   atorvastatin (LIPITOR) 40 MG tablet    Sig: Take 1 tablet (40 mg total) by mouth every other day.    Dispense:  45 tablet    Refill:  1   Patient Instructions  Thanks for coming in today.  No medication changes for now.  I do think continuing to meet  with therapist is a good idea.  We can recheck cholesterol next visit, as I suspect it will improve through the summer  Flonase if needed for allergies, albuterol if needed for wheezing but if you require that frequently or that does not improve symptoms be seen.  Take care.     Signed,   Meredith Staggers, MD [facility], [facility] 12/28/22 9:07 AM

## 2022-12-28 NOTE — Patient Instructions (Signed)
Thanks for coming in today.  No medication changes for now.  I do think continuing to meet with therapist is a good idea.  We can recheck cholesterol next visit, as I suspect it will improve through the summer  Flonase if needed for allergies, albuterol if needed for wheezing but if you require that frequently or that does not improve symptoms be seen.  Take care.

## 2023-01-10 ENCOUNTER — Telehealth: Payer: Self-pay | Admitting: Family Medicine

## 2023-01-10 NOTE — Telephone Encounter (Signed)
Patient has been seeing Dr. Neva Seat, but since he moved to [facility], she would like to know if she can see Dr. Okey Dupre. Best callback is [phone]. Are both providers okay with this transition?

## 2023-01-10 NOTE — Telephone Encounter (Signed)
Very nice patient.  Hate to lose her as her primary care provider but understand and okay with transfer of care if needed.  Thanks.

## 2023-01-10 NOTE — Telephone Encounter (Signed)
Fine with me, should keep care with PCP until [facility] visit

## 2023-03-07 DIAGNOSIS — M1712 Unilateral primary osteoarthritis, left knee: Secondary | ICD-10-CM | POA: Diagnosis not present

## 2023-03-23 ENCOUNTER — Encounter: Payer: Self-pay | Admitting: Podiatry

## 2023-03-23 ENCOUNTER — Ambulatory Visit: Payer: PPO | Admitting: Podiatry

## 2023-03-23 DIAGNOSIS — G5791 Unspecified mononeuropathy of right lower limb: Secondary | ICD-10-CM | POA: Diagnosis not present

## 2023-03-23 DIAGNOSIS — M7752 Other enthesopathy of left foot: Secondary | ICD-10-CM | POA: Diagnosis not present

## 2023-03-23 DIAGNOSIS — M21622 Bunionette of left foot: Secondary | ICD-10-CM

## 2023-03-23 MED ORDER — DEXAMETHASONE SODIUM PHOSPHATE 120 MG/30ML IJ SOLN
2.0000 mg | Freq: Once | INTRAMUSCULAR | Status: AC
  Administered 2023-03-23: 2 mg via INTRA_ARTICULAR

## 2023-03-23 NOTE — Progress Notes (Signed)
She presents today for follow-up of her neuropathy still states that she is having pins-and-needles in her right foot.  States that it seems to be worsening as time goes on and it is starting to become worrisome.  She denies any changes in her strength.  She had a fracture to the tibia in 1995 and developed the tingling in her plantar lateral foot a decade later.  Objective: Vital signs are stable alert and oriented x 3 pulses are palpable.  Neurologic sensorium is diminished along the sural distribution and plantar lateral tibial nerve..  There is no weakness no change in her deep tendon reflexes.  Previous nerve conduction and EMG demonstrated only 1 area of peroneal nerve delay.  Assessment: Chronic neuropathic pain right.  Plan: At this point I think that she should have her back looked at at [facility] neurosurgery.  We are going to refer her to Dr. Marikay Alar for workup possible radiculopathy versus traumatic nerve injury.

## 2023-04-05 NOTE — Telephone Encounter (Signed)
Prolia VOB initiated via [email or web address]  Next Prolia inj DUE: 05/07/23

## 2023-04-11 DIAGNOSIS — M170 Bilateral primary osteoarthritis of knee: Secondary | ICD-10-CM | POA: Diagnosis not present

## 2023-04-12 NOTE — Telephone Encounter (Signed)
Pt ready for scheduling on or after 05/07/23  Out-of-pocket cost due at time of visit: $345  Primary: [employer] Prolia co-insurance: 20% (approximately $320) Admin fee co-insurance: 20% (approximately $25)  Deductible: does not apply  Prior Auth NOT required  Secondary: N/A Prolia co-insurance:  Admin fee co-insurance:  Deductible:  Prior Auth:  PA# Valid:   ** This summary of benefits is an estimation of the patient's out-of-pocket cost. Exact cost may vary based on individual plan coverage.

## 2023-04-18 DIAGNOSIS — R202 Paresthesia of skin: Secondary | ICD-10-CM | POA: Diagnosis not present

## 2023-04-18 DIAGNOSIS — M170 Bilateral primary osteoarthritis of knee: Secondary | ICD-10-CM | POA: Diagnosis not present

## 2023-04-18 DIAGNOSIS — R200 Anesthesia of skin: Secondary | ICD-10-CM | POA: Diagnosis not present

## 2023-04-19 ENCOUNTER — Other Ambulatory Visit: Payer: Self-pay | Admitting: *Deleted

## 2023-04-19 DIAGNOSIS — M810 Age-related osteoporosis without current pathological fracture: Secondary | ICD-10-CM

## 2023-04-25 DIAGNOSIS — M170 Bilateral primary osteoarthritis of knee: Secondary | ICD-10-CM | POA: Diagnosis not present

## 2023-05-08 ENCOUNTER — Other Ambulatory Visit: Payer: Self-pay | Admitting: Family Medicine

## 2023-05-08 DIAGNOSIS — M810 Age-related osteoporosis without current pathological fracture: Secondary | ICD-10-CM | POA: Diagnosis not present

## 2023-05-09 ENCOUNTER — Encounter: Payer: Self-pay | Admitting: *Deleted

## 2023-05-09 DIAGNOSIS — R200 Anesthesia of skin: Secondary | ICD-10-CM | POA: Diagnosis not present

## 2023-05-09 LAB — BASIC METABOLIC PANEL
BUN/Creatinine Ratio: 24 (ref 12–28)
BUN: 20 mg/dL (ref 8–27)
CO2: 23 mmol/L (ref 20–29)
Calcium: 9 mg/dL (ref 8.7–10.3)
Chloride: 105 mmol/L (ref 96–106)
Creatinine, Ser: 0.82 mg/dL (ref 0.57–1.00)
Glucose: 92 mg/dL (ref 70–99)
Potassium: 4 mmol/L (ref 3.5–5.2)
Sodium: 141 mmol/L (ref 134–144)
eGFR: 75 mL/min/{1.73_m2} (ref >59)

## 2023-05-09 LAB — VITAMIN D 25 HYDROXY (VIT D DEFICIENCY, FRACTURES): Vit D, 25-Hydroxy: 61.4 ng/mL (ref 30.0–100.0)

## 2023-05-15 ENCOUNTER — Ambulatory Visit: Payer: PPO | Admitting: Family Medicine

## 2023-05-15 DIAGNOSIS — M810 Age-related osteoporosis without current pathological fracture: Secondary | ICD-10-CM

## 2023-05-15 MED ORDER — DENOSUMAB 60 MG/ML ~~LOC~~ SOSY
60.0000 mg | PREFILLED_SYRINGE | Freq: Once | SUBCUTANEOUS | Status: AC
Start: 2023-05-15 — End: 2023-05-15
  Administered 2023-05-15: 60 mg via SUBCUTANEOUS

## 2023-05-15 NOTE — Progress Notes (Signed)
Patient given Jordan prolia injection 60mg /ml in the left arm. Patient tolerated injection well without reaction at the injection site. Patient will schedule next injection, which is 6 months from today.

## 2023-05-16 DIAGNOSIS — R202 Paresthesia of skin: Secondary | ICD-10-CM | POA: Diagnosis not present

## 2023-05-18 DIAGNOSIS — R350 Frequency of micturition: Secondary | ICD-10-CM | POA: Diagnosis not present

## 2023-05-18 DIAGNOSIS — R3914 Feeling of incomplete bladder emptying: Secondary | ICD-10-CM | POA: Diagnosis not present

## 2023-05-26 DIAGNOSIS — Z1231 Encounter for screening mammogram for malignant neoplasm of breast: Secondary | ICD-10-CM | POA: Diagnosis not present

## 2023-05-26 LAB — HM MAMMOGRAPHY

## 2023-05-27 NOTE — Telephone Encounter (Signed)
Last Prolia inj 05/15/23 Next Prolia inj due 11/13/23

## 2023-05-30 ENCOUNTER — Encounter: Payer: Self-pay | Admitting: Family Medicine

## 2023-06-16 ENCOUNTER — Telehealth: Payer: Self-pay

## 2023-06-16 ENCOUNTER — Telehealth: Payer: Self-pay | Admitting: Family Medicine

## 2023-06-16 NOTE — Telephone Encounter (Signed)
This has been placed inside the office box

## 2023-06-16 NOTE — Telephone Encounter (Signed)
Prolia VOB initiated via [email or web address]  Next Prolia inj DUE: 11/13/23

## 2023-06-16 NOTE — Telephone Encounter (Signed)
Please start PA for prolia injections patient last injection was on Sept 23,2024 @ [facility]

## 2023-06-16 NOTE — Telephone Encounter (Signed)
Added patient in [employer] but patient is not due for Prolia again until March 2025. Created a new encounter for Prolia BIV, will postpone it until closer to due date.

## 2023-06-16 NOTE — Telephone Encounter (Signed)
Patient dropped off letter for Dr.Crawford, states she would like her to read it before appointment on Tuesday. States it is a Physicist, medical introducing herself. Letter will be in providers box up front.

## 2023-06-17 ENCOUNTER — Other Ambulatory Visit: Payer: Self-pay | Admitting: Medical Genetics

## 2023-06-17 DIAGNOSIS — Z006 Encounter for examination for normal comparison and control in clinical research program: Secondary | ICD-10-CM

## 2023-06-19 DIAGNOSIS — Z01419 Encounter for gynecological examination (general) (routine) without abnormal findings: Secondary | ICD-10-CM | POA: Diagnosis not present

## 2023-06-19 NOTE — Telephone Encounter (Signed)
Noted  

## 2023-06-20 ENCOUNTER — Encounter: Payer: Self-pay | Admitting: Internal Medicine

## 2023-06-20 ENCOUNTER — Ambulatory Visit: Payer: PPO | Admitting: Internal Medicine

## 2023-06-20 VITALS — BP 118/68 | HR 55 | Temp 98.6°F | Ht 70.0 in | Wt 145.5 lb

## 2023-06-20 DIAGNOSIS — F5101 Primary insomnia: Secondary | ICD-10-CM

## 2023-06-20 DIAGNOSIS — F418 Other specified anxiety disorders: Secondary | ICD-10-CM | POA: Diagnosis not present

## 2023-06-20 DIAGNOSIS — M810 Age-related osteoporosis without current pathological fracture: Secondary | ICD-10-CM

## 2023-06-20 DIAGNOSIS — E782 Mixed hyperlipidemia: Secondary | ICD-10-CM

## 2023-06-20 DIAGNOSIS — F439 Reaction to severe stress, unspecified: Secondary | ICD-10-CM | POA: Diagnosis not present

## 2023-06-20 DIAGNOSIS — G4700 Insomnia, unspecified: Secondary | ICD-10-CM | POA: Insufficient documentation

## 2023-06-20 LAB — CBC
HCT: 43.3 % (ref 36.0–46.0)
Hemoglobin: 13.9 g/dL (ref 12.0–15.0)
MCHC: 32.1 g/dL (ref 30.0–36.0)
MCV: 93.6 fL (ref 78.0–100.0)
Platelets: 163 10*3/uL (ref 150.0–400.0)
RBC: 4.62 Mil/uL (ref 3.87–5.11)
RDW: 12.9 % (ref 11.5–15.5)
WBC: 4.1 10*3/uL (ref 4.0–10.5)

## 2023-06-20 LAB — COMPREHENSIVE METABOLIC PANEL
ALT: 14 U/L (ref 0–35)
AST: 17 U/L (ref 0–37)
Albumin: 4.2 g/dL (ref 3.5–5.2)
Alkaline Phosphatase: 36 U/L — ABNORMAL LOW (ref 39–117)
BUN: 21 mg/dL (ref 6–23)
CO2: 29 meq/L (ref 19–32)
Calcium: 8.9 mg/dL (ref 8.4–10.5)
Chloride: 106 meq/L (ref 96–112)
Creatinine, Ser: 0.81 mg/dL (ref 0.40–1.20)
GFR: 71.85 mL/min (ref >60.00)
Glucose, Bld: 92 mg/dL (ref 70–99)
Potassium: 4.1 meq/L (ref 3.5–5.1)
Sodium: 141 meq/L (ref 135–145)
Total Bilirubin: 0.6 mg/dL (ref 0.2–1.2)
Total Protein: 6.5 g/dL (ref 6.0–8.3)

## 2023-06-20 LAB — VITAMIN B12: Vitamin B-12: 241 pg/mL (ref 211–911)

## 2023-06-20 LAB — LIPID PANEL
Cholesterol: 174 mg/dL (ref 0–200)
HDL: 70.3 mg/dL (ref >39.00)
LDL Cholesterol: 92 mg/dL (ref 0–99)
NonHDL: 103.86
Total CHOL/HDL Ratio: 2
Triglycerides: 58 mg/dL (ref 0.0–149.0)
VLDL: 11.6 mg/dL (ref 0.0–40.0)

## 2023-06-20 LAB — TSH: TSH: 2.98 u[IU]/mL (ref 0.35–5.50)

## 2023-06-20 MED ORDER — SERTRALINE HCL 25 MG PO TABS: 25.0000 mg | ORAL_TABLET | Freq: Every day | ORAL | 3 refills | Status: AC

## 2023-06-20 NOTE — Progress Notes (Signed)
   Subjective:   Patient ID: Erika Johns, female    DOB: 1951-01-02, 72 y.o.   MRN: [id number]  HPI The patient is a 72 YO female coming in for transfer of care. She does have some ongoing medical concerns (see A/P for details)  PMH, FMH, social history reviewed and updated  Review of Systems  Constitutional: Negative.   HENT: Negative.    Eyes: Negative.   Respiratory:  Negative for cough, chest tightness and shortness of breath.   Cardiovascular:  Negative for chest pain, palpitations and leg swelling.  Gastrointestinal:  Positive for constipation. Negative for abdominal distention, abdominal pain, diarrhea, nausea and vomiting.  Musculoskeletal: Negative.   Skin: Negative.   Neurological: Negative.   Psychiatric/Behavioral:  Positive for sleep disturbance.     Objective:  Physical Exam Constitutional:      Appearance: She is well-developed.  HENT:     Head: Normocephalic and atraumatic.  Cardiovascular:     Rate and Rhythm: Normal rate and regular rhythm.  Pulmonary:     Effort: Pulmonary effort is normal. No respiratory distress.     Breath sounds: Normal breath sounds. No wheezing or rales.  Abdominal:     General: Bowel sounds are normal. There is no distension.     Palpations: Abdomen is soft.     Tenderness: There is no abdominal tenderness. There is no rebound.  Musculoskeletal:     Cervical back: Normal range of motion.  Skin:    General: Skin is warm and dry.  Neurological:     Mental Status: She is alert and oriented to person, place, and time.     Coordination: Coordination normal.     Vitals:   06/20/23 0821  BP: 118/68  Pulse: (!) 55  Temp: 98.6 F (37 C)  TempSrc: Oral  SpO2: 97%  Weight: 145 lb 8 oz (66 kg)  Height: 5\' 10"  (1.778 m)    Assessment & Plan:  Visit time 35 minutes in face to face communication with patient and coordination of care, additional 10 minutes spent in record review, coordination or care, ordering tests,  communicating/referring to other healthcare professionals, documenting in medical records all on the same day of the visit for total time  45 minutes spent on the visit.

## 2023-06-20 NOTE — Patient Instructions (Addendum)
The RSV vaccine you can get at pharmacy.  We can try trazodone for sleep if needed.  For the zoloft you can try to take 1/2 pill every other day for 1-2 weeks then stop. If you notice a difference you can go back on it.  We will check the labs today.

## 2023-06-23 DIAGNOSIS — E782 Mixed hyperlipidemia: Secondary | ICD-10-CM | POA: Insufficient documentation

## 2023-06-23 DIAGNOSIS — M810 Age-related osteoporosis without current pathological fracture: Secondary | ICD-10-CM | POA: Insufficient documentation

## 2023-06-23 DIAGNOSIS — F418 Other specified anxiety disorders: Secondary | ICD-10-CM | POA: Insufficient documentation

## 2023-06-23 NOTE — Assessment & Plan Note (Signed)
Checking lipid panel and adjust lipitor 40 mg daily as needed. 

## 2023-06-23 NOTE — Assessment & Plan Note (Signed)
Taking prolia every 6 months and will continue. Checking CMP today.

## 2023-06-23 NOTE — Assessment & Plan Note (Signed)
Taking zoloft 25 mg daily and rx done. We discussed wean off if she desired. Using clonazepam for sleep currently.

## 2023-06-23 NOTE — Assessment & Plan Note (Signed)
Counseled about gene-sight and she is seeing psychiatry later today. She is using clonazepam currently.

## 2023-07-06 ENCOUNTER — Other Ambulatory Visit (HOSPITAL_COMMUNITY)
Admission: RE | Admit: 2023-07-06 | Discharge: 2023-07-06 | Disposition: A | Discharge status: Home / Self Care | Payer: PPO | Source: Ambulatory Visit | Attending: Oncology | Admitting: Oncology

## 2023-07-06 DIAGNOSIS — Z006 Encounter for examination for normal comparison and control in clinical research program: Secondary | ICD-10-CM | POA: Insufficient documentation

## 2023-07-18 LAB — GENECONNECT MOLECULAR SCREEN: Genetic Analysis Overall Interpretation: NEGATIVE

## 2023-09-20 ENCOUNTER — Encounter: Payer: Self-pay | Admitting: Internal Medicine

## 2023-10-12 NOTE — Telephone Encounter (Signed)
 Prolia VOB initiated via [email or web address]  Next Prolia inj DUE: 11/13/23

## 2023-10-13 DIAGNOSIS — H35373 Puckering of macula, bilateral: Secondary | ICD-10-CM | POA: Diagnosis not present

## 2023-10-13 DIAGNOSIS — H04123 Dry eye syndrome of bilateral lacrimal glands: Secondary | ICD-10-CM | POA: Diagnosis not present

## 2023-10-13 DIAGNOSIS — H35363 Drusen (degenerative) of macula, bilateral: Secondary | ICD-10-CM | POA: Diagnosis not present

## 2023-10-13 DIAGNOSIS — H2512 Age-related nuclear cataract, left eye: Secondary | ICD-10-CM | POA: Diagnosis not present

## 2023-10-13 DIAGNOSIS — Z961 Presence of intraocular lens: Secondary | ICD-10-CM | POA: Diagnosis not present

## 2023-10-19 DIAGNOSIS — Z1211 Encounter for screening for malignant neoplasm of colon: Secondary | ICD-10-CM | POA: Diagnosis not present

## 2023-10-19 DIAGNOSIS — Z800 Family history of malignant neoplasm of digestive organs: Secondary | ICD-10-CM | POA: Diagnosis not present

## 2023-10-19 LAB — HM COLONOSCOPY

## 2023-10-20 ENCOUNTER — Ambulatory Visit: Payer: Self-pay | Admitting: Internal Medicine

## 2023-10-20 ENCOUNTER — Ambulatory Visit: Payer: PPO | Admitting: Medical

## 2023-10-20 VITALS — BP 116/74 | HR 60 | Temp 98.2°F | Wt 149.4 lb

## 2023-10-20 DIAGNOSIS — T7840XD Allergy, unspecified, subsequent encounter: Secondary | ICD-10-CM

## 2023-10-20 DIAGNOSIS — J3489 Other specified disorders of nose and nasal sinuses: Secondary | ICD-10-CM | POA: Diagnosis not present

## 2023-10-20 MED ORDER — CETIRIZINE HCL 10 MG PO TABS: 10.0000 mg | ORAL_TABLET | Freq: Every day | ORAL | 0 refills | Status: AC

## 2023-10-20 MED ORDER — CLARITHROMYCIN 500 MG PO TABS: 500.0000 mg | ORAL_TABLET | Freq: Two times a day (BID) | ORAL | 0 refills | Status: DC

## 2023-10-20 NOTE — Telephone Encounter (Signed)
  Chief Complaint: sinus pain and congestion Symptoms: cough, nasal congestion/runny nose, sinus pain, sore throat Frequency: x 7 days Pertinent Negatives: Patient denies earaches, difficulty breathing Disposition: [] ED /[] Urgent Care (no appt availability in office) / [x] Appointment(In office/virtual)/ []  [facility]/ [] Home Care/ [] Refused Recommended Disposition /[] [facility]/ []  Follow-up with PCP Additional Notes: Patient states she took the home COVID and flu test that was negative. Patient states she has taken Alka Seltzer plus, Zyrtec, albuterol and Flonase. Patient states she has also been using nasal saline washes and gargling with salt and soda water. Patient requesting to be seen today at any available locations.  Copied from CRM [id number]. Topic: Clinical - Pink Word Triage >> Oct 20, 2023  7:38 AM Kathryne Eriksson wrote: Reason for Triage: Concerns Of Possible Sinus Infection >> Oct 20, 2023  7:40 AM Kathryne Eriksson wrote: Patient states she's been having head congestion for about a week now, tested NEGATIVE for both flu and COVID. NO FEVER  Reason for Disposition  [1] Using nasal washes and pain medicine > 24 hours AND [2] sinus pain (around cheekbone or eye) persists  Answer Assessment - Initial Assessment Questions 1. LOCATION: "Where does it hurt?"      Forehead.  2. ONSET: "When did the sinus pain start?"  (e.g., hours, days)      X 1 week (started last Saturday).  3. SEVERITY: "How bad is the pain?"   (Scale 1-10; mild, moderate or severe)   - MILD (1-3): doesn't interfere with normal activities    - MODERATE (4-7): interferes with normal activities (e.g., work or school) or awakens from sleep   - SEVERE (8-10): excruciating pain and patient unable to do any normal activities        Moderate.  4. RECURRENT SYMPTOM: "Have you ever had sinus problems before?" If Yes, ask: "When was the last time?" and "What happened that time?"      Patient states she  has small ear canals and when she gets colds that "tend to be in my head". Patient states it has been a while since her last sinus infection.  5. NASAL CONGESTION: "Is the nose blocked?" If Yes, ask: "Can you open it or must you breathe through your mouth?"     Yes, states she is having to breathe out of her mouth.  6. NASAL DISCHARGE: "Do you have discharge from your nose?" If so ask, "What color?"     Yes, states it is mostly clear and sometimes a little bloody.  7. FEVER: "Do you have a fever?" If Yes, ask: "What is it, how was it measured, and when did it start?"      Denies. Took temperature and it was 98.9.  8. OTHER SYMPTOMS: "Do you have any other symptoms?" (e.g., sore throat, cough, earache, difficulty breathing)     Wheezing (last night and states she took albuterol), cough, sore throat.  Protocols used: Sinus Pain or Congestion-A-AH

## 2023-10-20 NOTE — Patient Instructions (Signed)
 Recommendations: Good about your water intake over the next few days Continue some nasal saline Use your Flonase at least for the next 3-4 more days for nasal congestion.  You can use this longer if needed. Continue the cetirizine Zyrtec during the next week or so while you are using the Flonase as well.  These are to help with the congestion and drainage and runny nose If you do feel more sinus pressure you can use some short-term over-the-counter Sudafed such as 30 mg or 60 mg twice daily for a few days for sinus pressure and worse congestion feeling despite the Zyrtec and Flonase Over the weekend if you gradually feel worse sinus pressure and pain, start getting more colored mucus up such as green and brown mucus then begin the Biaxin antibiotic. If you do begin the Biaxin antibiotic, hold off on your cholesterol medicine during the timeframe you are on the antibiotic for about a week due to possible reaction Continue your albuterol inhaler as needed for shortness of breath or wheezing If needed you can use some short-term Afrin nasal spray at bedtime but no more than 3 days in a row if you feel very stuffy before you go to bed If not seeing significant improvement within a week then call or recheck

## 2023-10-20 NOTE — Telephone Encounter (Signed)
 Copied from CRM [id number]. Topic: Clinical - Pink Word Triage >> Oct 20, 2023  7:38 AM Kathryne Eriksson wrote: Reason for Triage: Concerns Of Possible Sinus Infection >> Oct 20, 2023  7:40 AM Kathryne Eriksson wrote: Patient states she's been having head congestion for about a week now, tested NEGATIVE for both flu and COVID. NO FEVER

## 2023-10-20 NOTE — Progress Notes (Signed)
 Subjective:  Erika Johns is a 73 y.o. female who presents for Chief Complaint  Patient presents with   URI    Cough, sore throat, sinus pain, sinus congestion, runny nose. 1 week. Neg for flu and covid.      Here for illness.  Has been having cough, sore throat, sinus pain, runny nose, stuffy nose.  Had low grade fever some this past week.  Having hard time breathing thru nose including last night.  Used some Flonase last night.  Has used her inhaler some, and a few doses of cetirizine.   Been sick a week.  Has used rest, alka seltzer.  Sometimes gets cold weather asthma, but otherwise doesn't have to use inhaler that often.    Nonsmoker.  No other aggravating or relieving factors.    No other c/o.  Past Medical History:  Diagnosis Date   Allergy    Anxiety    Arthritis    Constipation, chronic    Depression    Hyperlipidemia    Phreesia 03/07/2020   IBS (irritable bowel syndrome)    Insomnia    PONV (postoperative nausea and vomiting)    severe n/v-needs scop   Current Outpatient Medications on File Prior to Visit  Medication Sig Dispense Refill   albuterol (VENTOLIN HFA) 108 (90 Base) MCG/ACT inhaler Inhale 1-2 puffs into the lungs every 6 (six) hours as needed for wheezing or shortness of breath. 8 g 0   atorvastatin (LIPITOR) 40 MG tablet Take 1 tablet (40 mg total) by mouth every other day. 45 tablet 1   Cholecalciferol (VITAMIN D3) 1000 UNITS CAPS Take by mouth daily.     clonazePAM (KLONOPIN) 0.5 MG tablet Take 0.5-1 tablets (0.25-0.5 mg total) by mouth 2 (two) times daily as needed for anxiety. 45 tablet 0   denosumab (PROLIA) 60 MG/ML SOSY injection Inject 60 mg into the skin every 6 (six) months.     diclofenac sodium (VOLTAREN) 1 % GEL Apply 2 g topically 4 (four) times daily as needed (left knee pain). 100 g 0   glucosamine-chondroitin 500-400 MG tablet Take 1 tablet by mouth. 2 gel caps at breakfast     OVER THE COUNTER MEDICATION      sertraline  (ZOLOFT) 25 MG tablet Take 1 tablet (25 mg total) by mouth daily. 90 tablet 3   Suvorexant (BELSOMRA) 10 MG TABS Take 0.5-1 tablets (5-10 mg total) by mouth at bedtime as needed. 30 tablet 3   tamsulosin (FLOMAX) 0.4 MG CAPS capsule Take 0.4 mg by mouth.     UNABLE TO FIND Med Name: TUMERIC     UNABLE TO FIND Med Name: Murtis Sink     vitamin k 100 MCG tablet Take 100 mcg by mouth daily.     fluticasone (FLONASE) 50 MCG/ACT nasal spray SPRAY 2 SPRAYS INTO EACH NOSTRIL EVERY DAY (Patient not taking: Reported on 10/20/2023) 16 mL 6   hydrocortisone (ANUSOL-HC) 2.5 % rectal cream Place 1 application rectally 2 (two) times daily. As needed short term for itching (Patient not taking: Reported on 10/20/2023) 30 g 0   No current facility-administered medications on file prior to visit.     The following portions of the patient's history were reviewed and updated as appropriate: allergies, current medications, past family history, past medical history, past social history, past surgical history and problem list.  ROS Otherwise as in subjective above    Objective: BP 116/74   Pulse 60   Temp 98.2 F (36.8 C) (Oral)  Wt 149 lb 6.4 oz (67.8 kg)   SpO2 96%   BMI 21.44 kg/m   General appearance: alert, no distress, well developed, well nourished HEENT: normocephalic, sclerae anicteric, conjunctiva pink and moist, TMs flat, no erythema, nares with some clear discharge, + erythema, pharynx normal Oral cavity: MMM, no lesions Neck: supple, no lymphadenopathy, no thyromegaly, no masses Heart: RRR, normal S1, S2, no murmurs Lungs: CTA bilaterally, no wheezes, rhonchi, or rales Pulses: 2+ radial pulses, 2+ pedal pulses, normal cap refill Ext: no edema    Assessment: Encounter Diagnoses  Name Primary?   Sinus pressure Yes   Allergy, subsequent encounter      Plan: Discussed findings and recommendation  I suspect she has had a viral illness that needs a little more time to resolve.  Discussed the following recommendations:  Recommendations: Good about your water intake over the next few days Continue some nasal saline Use your Flonase at least for the next 3-4 more days for nasal congestion.  You can use this longer if needed. Continue the cetirizine Zyrtec during the next week or so while you are using the Flonase as well.  These are to help with the congestion and drainage and runny nose If you do feel more sinus pressure you can use some short-term over-the-counter Sudafed such as 30 mg or 60 mg twice daily for a few days for sinus pressure and worse congestion feeling despite the Zyrtec and Flonase Over the weekend if you gradually feel worse sinus pressure and pain, start getting more colored mucus up such as green and brown mucus, then begin the Biaxin antibiotic. If you do begin the Biaxin antibiotic, hold off on your cholesterol medicine during the timeframe you are on the antibiotic for about a week due to possible reaction Continue your albuterol inhaler as needed for shortness of breath or wheezing If needed you can use some short-term Afrin nasal spray at bedtime but no more than 3 days in a row if you feel very stuffy before you go to bed If not seeing significant improvement within a week then call or recheck   Erika Johns "Erika Johns" was seen today for uri.  Diagnoses and all orders for this visit:  Sinus pressure  Allergy, subsequent encounter -     cetirizine (ZYRTEC) 10 MG tablet; Take 1 tablet (10 mg total) by mouth at bedtime.  Other orders -     clarithromycin (BIAXIN) 500 MG tablet; Take 1 tablet (500 mg total) by mouth 2 (two) times daily.    Follow up: prn

## 2023-10-23 ENCOUNTER — Other Ambulatory Visit: Payer: Self-pay | Admitting: Internal Medicine

## 2023-10-23 ENCOUNTER — Other Ambulatory Visit (HOSPITAL_COMMUNITY): Payer: Self-pay

## 2023-10-23 DIAGNOSIS — G4700 Insomnia, unspecified: Secondary | ICD-10-CM

## 2023-10-23 NOTE — Telephone Encounter (Unsigned)
 Copied from CRM [id number]. Topic: Clinical - Medication Refill >> Oct 23, 2023  3:52 PM Almira Coaster wrote: Most Recent Primary Care Visit:  Provider: Jac Canavan  Department: [facility]  Visit Type: ACUTE  Date: 10/20/2023  Medication: belsomra 10mg  tablet   Has the patient contacted their pharmacy? No, previous primary care provider ordered for patient previously.  (Agent: If no, request that the patient contact the pharmacy for the refill. If patient does not wish to contact the pharmacy document the reason why and proceed with request.) (Agent: If yes, when and what did the pharmacy advise?)  Is this the correct pharmacy for this prescription? Yes If no, delete pharmacy and type the correct one.  This is the patient's preferred pharmacy:  [id number] [place], [place] - [address] AT [facility] [address] [place] [address] Phone: [phone] Fax: [phone]   Has the prescription been filled recently? No  Is the patient out of the medication? No  Has the patient been seen for an appointment in the last year OR does the patient have an upcoming appointment? Yes  Can we respond through MyChart? Yes  Agent: Please be advised that Rx refills may take up to 3 business days. We ask that you follow-up with your pharmacy.

## 2023-10-24 ENCOUNTER — Ambulatory Visit (INDEPENDENT_AMBULATORY_CARE_PROVIDER_SITE_OTHER)

## 2023-10-24 VITALS — BP 110/80 | HR 74 | Ht 71.0 in | Wt 150.2 lb

## 2023-10-24 DIAGNOSIS — Z0000 Encounter for general adult medical examination without abnormal findings: Secondary | ICD-10-CM | POA: Diagnosis not present

## 2023-10-24 MED ORDER — BELSOMRA 10 MG PO TABS: 5.0000 mg | ORAL_TABLET | Freq: Every evening | ORAL | 3 refills | Status: AC | PRN

## 2023-10-24 NOTE — Progress Notes (Signed)
 Subjective:   Erika Johns is a 73 y.o. who presents for a Medicare Wellness preventive visit.  Visit Complete: In person  AWV Questionnaire: No: Patient Medicare AWV questionnaire was not completed prior to this visit.  Cardiac Risk Factors include: advanced age (>52men, >90 women);dyslipidemia;family history of premature cardiovascular disease     Objective:    Today's Vitals   10/24/23 1307  BP: 110/80  Pulse: 74  SpO2: 98%  Weight: 150 lb 3.2 oz (68.1 kg)  Height: 5\' 11"  (1.803 m)   Body mass index is 20.95 kg/m.     10/24/2023    1:04 PM 12/08/2022   11:31 AM 12/14/2021    8:27 AM 10/20/2021    2:01 PM 03/10/2020    8:19 AM 11/26/2018   10:09 AM 04/27/2017    3:38 PM  Advanced Directives  Does Patient Have a Medical Advance Directive? Yes Yes Yes Yes Yes Yes Yes  Type of Estate agent of [place];Living will Healthcare Power of [employer] of [place];Living will  Healthcare Power of [place];Living will Healthcare Power of [place];Living will Healthcare Power of [place];Living will  Does patient want to make changes to medical advance directive?    No - Patient declined  No - Patient declined   Copy of Healthcare Power of Attorney in Chart? No - copy requested No - copy requested No - copy requested  No - copy requested No - copy requested No - copy requested    Current Medications (verified) Outpatient Encounter Medications as of 10/24/2023  Medication Sig   albuterol (VENTOLIN HFA) 108 (90 Base) MCG/ACT inhaler Inhale 1-2 puffs into the lungs every 6 (six) hours as needed for wheezing or shortness of breath.   atorvastatin (LIPITOR) 40 MG tablet Take 1 tablet (40 mg total) by mouth every other day.   cetirizine (ZYRTEC) 10 MG tablet Take 1 tablet (10 mg total) by mouth at bedtime.   Cholecalciferol (VITAMIN D3) 1000 UNITS CAPS Take by mouth daily.   clarithromycin (BIAXIN) 500 MG tablet Take 1 tablet (500 mg total) by  mouth 2 (two) times daily.   clonazePAM (KLONOPIN) 0.5 MG tablet Take 0.5-1 tablets (0.25-0.5 mg total) by mouth 2 (two) times daily as needed for anxiety.   denosumab (PROLIA) 60 MG/ML SOSY injection Inject 60 mg into the skin every 6 (six) months.   diclofenac sodium (VOLTAREN) 1 % GEL Apply 2 g topically 4 (four) times daily as needed (left knee pain).   fluticasone (FLONASE) 50 MCG/ACT nasal spray SPRAY 2 SPRAYS INTO EACH NOSTRIL EVERY DAY   glucosamine-chondroitin 500-400 MG tablet Take 1 tablet by mouth. 2 gel caps at breakfast   hydrocortisone (ANUSOL-HC) 2.5 % rectal cream Place 1 application rectally 2 (two) times daily. As needed short term for itching   OVER THE COUNTER MEDICATION    sertraline (ZOLOFT) 25 MG tablet Take 1 tablet (25 mg total) by mouth daily.   Suvorexant (BELSOMRA) 10 MG TABS Take 0.5-1 tablets (5-10 mg total) by mouth at bedtime as needed.   tamsulosin (FLOMAX) 0.4 MG CAPS capsule Take 0.4 mg by mouth.   UNABLE TO FIND Med Name: TUMERIC   UNABLE TO FIND Med Name: Murtis Sink   vitamin k 100 MCG tablet Take 100 mcg by mouth daily.   No facility-administered encounter medications on file as of 10/24/2023.    Allergies (verified) Effexor [venlafaxine hydrochloride], Hydrocodone, and Ampicillin   History: Past Medical History:  Diagnosis Date   Allergy  Anxiety    Arthritis    Constipation, chronic    Depression    Hyperlipidemia    Phreesia 03/07/2020   IBS (irritable bowel syndrome)    Insomnia    PONV (postoperative nausea and vomiting)    severe n/v-needs scop   Past Surgical History:  Procedure Laterality Date   COLONOSCOPY     DILATION AND CURETTAGE OF UTERUS  2006   FRACTURE SURGERY  1996   fx [id number]   HARDWARE REMOVAL  1996   rt ankle   KNEE ARTHROSCOPY  2003   lt   KNEE ARTHROSCOPY     right   ORIF CLAVICULAR FRACTURE Right 05/28/2013   Procedure: OPEN REDUCTION INTERNAL FIXATION (ORIF) CLAVICULAR FRACTURE;  Surgeon: Sheral Apley,  MD;  Location: [facility];  Service: Orthopedics;  Laterality: Right;   TONSILLECTOMY     History reviewed. No pertinent family history. Social History   Socioeconomic History   Marital status: Widowed    Spouse name: Not on file   Number of children: Not on file   Years of education: Not on file   Highest education level: Bachelor's degree (e.g., BA, AB, BS)  Occupational History   Not on file  Tobacco Use   Smoking status: Never    Passive exposure: Never   Smokeless tobacco: Never  Substance and Sexual Activity   Alcohol use: Yes    Alcohol/week: 2.0 standard drinks of alcohol    Types: 2 Glasses of wine per week    Comment: occasionally   Drug use: No   Sexual activity: Not Currently  Other Topics Concern   Not on file  Social History Narrative   Not on file   Social Drivers of Health   Financial Resource Strain: Low Risk  (10/24/2023)   Overall Financial Resource Strain (CARDIA)    Difficulty of Paying Living Expenses: Not hard at all  Food Insecurity: No Food Insecurity (10/24/2023)   Hunger Vital Sign    Worried About Running Out of Food in the Last Year: Never true    Ran Out of Food in the Last Year: Never true  Transportation Needs: No Transportation Needs (10/24/2023)   PRAPARE - Administrator, Civil Service (Medical): No    Lack of Transportation (Non-Medical): No  Physical Activity: Sufficiently Active (10/24/2023)   Exercise Vital Sign    Days of Exercise per Week: 7 days    Minutes of Exercise per Session: 60 min  Stress: No Stress Concern Present (10/24/2023)   [employer] of Occupational Health - Occupational Stress Questionnaire    Feeling of Stress : Not at all  Social Connections: Moderately Integrated (10/24/2023)   Social Connection and Isolation Panel [NHANES]    Frequency of Communication with Friends and Family: More than three times a week    Frequency of Social Gatherings with Friends and Family: More than three  times a week    Attends Religious Services: More than 4 times per year    Active Member of [employer] or Organizations: Yes    Attends Banker Meetings: More than 4 times per year    Marital Status: Widowed    Tobacco Counseling - Non Smoker Counseling given - N/A   Clinical Intake:  Pre-visit preparation completed: Yes  Pain : No/denies pain     BMI - recorded: 20.95 Nutritional Status: BMI of 19-24  Normal Nutritional Risks: None Diabetes: No  How often do you need to have someone help  you when you read instructions, pamphlets, or other written materials from your doctor or pharmacy?: 1 - Never  Interpreter Needed?: No  Information entered by :: Hassell Halim, CMA   Activities of Daily Living     10/24/2023    1:11 PM 12/08/2022   11:14 AM  In your present state of health, do you have any difficulty performing the following activities:  Hearing? 0 1  Vision? 0 0  Difficulty concentrating or making decisions? 0 0  Walking or climbing stairs? 0 0  Dressing or bathing? 0   Doing errands, shopping? 0 0  Preparing Food and eating ? N N  Using the Toilet? N N  In the past six months, have you accidently leaked urine? Malvin Johns  Comment wears pad   Do you have problems with loss of bowel control? N N  Managing your Medications? N N  Managing your Finances? N N  Housekeeping or managing your Housekeeping? N     Patient Care Team: Myrlene Broker, MD as PCP - General (Internal Medicine) Specialists, [facility] (Orthopedic Surgery) Dannielle Huh, MD as Consulting Physician (Orthopedic Surgery) Alfredo Martinez, MD as Consulting Physician (Urology) Donzetta Starch, MD as Consulting Physician (Dermatology) Elinor Parkinson, [place] as Consulting Physician (Podiatry) Noland Fordyce, MD as Consulting Physician (Obstetrics and Gynecology) Dominica Severin as Counselor  Indicate any recent Medical Services you may have received from other than [facility] providers in the  past year (date may be approximate).     Assessment:   This is a routine wellness examination for Erika Johns.  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       Depression Screen     10/24/2023    1:40 PM 10/24/2023    1:18 PM 06/20/2023    8:26 AM 12/28/2022    8:27 AM 12/08/2022   11:19 AM 12/30/2021    9:45 AM 12/14/2021    8:31 AM  PHQ 2/9 Scores  PHQ - 2 Score 1 0 0 0 0 1 0  PHQ- 9 Score 1 0 2 1 2 2      Fall Risk     10/24/2023    1:12 PM 06/20/2023    8:24 AM 12/28/2022    8:27 AM 12/08/2022   11:04 AM 12/07/2022   10:03 PM  Fall Risk   Falls in the past year? 0 0 0  0  Number falls in past yr: 0 0  0   Injury with Fall? 0 0  0   Risk for fall due to : No Fall Risks  No Fall Risks    Follow up Falls prevention discussed;Falls evaluation completed Falls evaluation completed  Falls evaluation completed;Education provided;Falls prevention discussed     MEDICARE RISK AT HOME:  Medicare Risk at Home Any stairs in or around the home?: Yes (outside) If so, are there any without handrails?: No Home free of loose throw rugs in walkways, pet beds, electrical cords, etc?: Yes Adequate lighting in your home to reduce risk of falls?: Yes Life alert?: No Use of a cane, walker or w/c?: No Grab  bars in the bathroom?: Yes Shower chair or bench in shower?: Yes Elevated toilet seat or a handicapped toilet?: Yes  TIMED UP AND GO:  Was the test performed?  No  Cognitive Function: 6CIT completed        10/24/2023    1:13 PM 12/08/2022   11:15 AM 10/20/2021    1:58 PM 03/10/2020    8:20 AM 11/26/2018   10:11 AM  6CIT Screen  What Year? 0 points 0 points 0 points 0 points 0 points   What month? 0 points 0 points 0 points 0 points 0 points  What time? 0 points 0 points 0 points 0 points 0 points  Count back from 20 0 points 0 points 0 points 0 points 0 points  Months in reverse 0 points 0 points 0 points 0 points 0 points  Repeat phrase 0 points 0 points 0 points 0 points 0 points  Total Score 0 points 0 points 0 points 0 points 0 points    Immunizations Immunization History  Administered Date(s) Administered   Fluad Quad(high Dose 65+) 05/19/2022   Fluad Trivalent(High Dose 65+) 05/17/2023   Influenza Split 04/28/2012, 05/22/2013, 06/05/2014   Influenza, High Dose Seasonal PF 06/13/2017, 05/24/2018, 05/24/2018, 05/18/2019, 05/18/2019, 05/11/2020   Influenza,inj,Quad PF,6+ Mos 05/12/2016   Influenza-Unspecified 05/25/2015, 06/13/2017, 05/26/2021   Moderna Sars-Covid-2 Vaccination 09/23/2019, 09/23/2019, 10/22/2019, 06/19/2020, 05/19/2022   PFIZER(Purple Top)SARS-COV-2 Vaccination 11/28/2020, 05/13/2021   Pfizer Covid-19 Vaccine Bivalent Booster 9yrs & up 12/30/2021   Pfizer(Comirnaty)Fall Seasonal Vaccine 12 years and older 05/17/2023   Pneumococcal Conjugate-13 01/12/2015, 05/31/2015   Pneumococcal Polysaccharide-23 09/27/2016   Respiratory Syncytial Virus Vaccine,Recomb Aduvanted(Arexvy) 07/06/2022   Td 11/22/2007   Tdap 03/22/2018   Zoster Recombinant(Shingrix) 07/23/2018, 11/08/2018   Zoster, Live 06/22/2010    Screening Tests Health Maintenance  Topic Date Due   MAMMOGRAM  05/25/2024   Medicare Annual Wellness (AWV)  10/23/2024   DTaP/Tdap/Td (3 - Td or Tdap) 03/22/2028   Colonoscopy  10/18/2033   Pneumonia Vaccine 3+ Years old  Completed   INFLUENZA VACCINE  Completed   DEXA SCAN  Completed   COVID-19 Vaccine  Completed   Hepatitis C Screening  Completed   Zoster Vaccines- Shingrix  Completed   HPV VACCINES  Aged Out    Health Maintenance  There are no preventive care reminders to display for this patient. Health Maintenance Items  Addressed: 10/24/23   Additional Screening:  Vision Screening: Recommended annual ophthalmology exams for early detection of glaucoma and other disorders of the eye.  Dental Screening: Recommended annual dental exams for proper oral hygiene  Community Resource Referral / Chronic Care Management: CRR required this visit?  No   CCM required this visit?  No     Plan:     I have personally reviewed and noted the following in the patient's chart:   Medical and social history Use of alcohol, tobacco or illicit drugs  Current medications and supplements including opioid prescriptions. Patient is not currently taking opioid prescriptions. Functional ability and status Nutritional status Physical activity Advanced directives List of other physicians Hospitalizations, surgeries, and ER visits in previous 12 months Vitals Screenings to include cognitive, depression, and falls Referrals and appointments  In addition, I have reviewed and discussed with patient certain preventive protocols, quality metrics, and best practice recommendations. A written personalized care plan for preventive services as well as general preventive health recommendations were provided to patient.     Darreld Mclean, CMA   10/24/2023  After Visit Summary: (MyChart) Due to this being a telephonic visit, the after visit summary with patients personalized plan was offered to patient via MyChart   Notes: Nothing significant to report at this time.

## 2023-10-24 NOTE — Patient Instructions (Addendum)
 Ms. Enriques , Thank you for taking time to come for your Medicare Wellness Visit. I appreciate your ongoing commitment to your health goals. Please review the following plan we discussed and let me know if I can assist you in the future.   Referrals/Orders/Follow-Ups/Clinician Recommendations: Aim for 30 minutes of exercise or brisk walking, 6-8 glasses of water, and 5 servings of fruits and vegetables each day. Mammogram due 05/2024.    This is a list of the screening recommended for you and due dates:  Health Maintenance  Topic Date Due   Mammogram  05/25/2024   Medicare Annual Wellness Visit  10/23/2024   DTaP/Tdap/Td vaccine (3 - Td or Tdap) 03/22/2028   Colon Cancer Screening  10/18/2033   Pneumonia Vaccine  Completed   Flu Shot  Completed   DEXA scan (bone density measurement)  Completed   COVID-19 Vaccine  Completed   Hepatitis C Screening  Completed   Zoster (Shingles) Vaccine  Completed   HPV Vaccine  Aged Out    Advanced directives: (Provided) Advance directive discussed with you today. I have provided a copy for you to complete at home and have notarized. Once this is complete, please bring a copy in to our office so we can scan it into your chart.   Next Medicare Annual Wellness Visit scheduled for next year: Yes - 10/2024

## 2023-10-26 ENCOUNTER — Other Ambulatory Visit: Payer: Self-pay | Admitting: *Deleted

## 2023-10-26 DIAGNOSIS — M810 Age-related osteoporosis without current pathological fracture: Secondary | ICD-10-CM

## 2023-10-27 NOTE — Telephone Encounter (Signed)
 [employer] Adv  Medical Buy and Annette Stable - Prior Authorization NOT required

## 2023-10-27 NOTE — Telephone Encounter (Signed)
 Patient is ready for scheduling on or after 11/13/23 BUY AND BILL  Out-of-pocket cost due at time of visit: $356.87  Primary: Medicare Prolia co-insurance: 20% (approximately $331.87) Admin fee co-insurance: 20% (approximately $25)  Deductible: does not apply  Prior Auth: NOT required  Secondary: N/A Prolia co-insurance:  Admin fee co-insurance:  Deductible:  Prior Auth:  PA# Valid:   ** This summary of benefits is an estimation of the patient's out-of-pocket cost. Exact cost may vary based on individual plan coverage.

## 2023-11-02 DIAGNOSIS — M8588 Other specified disorders of bone density and structure, other site: Secondary | ICD-10-CM | POA: Diagnosis not present

## 2023-11-02 LAB — HM DEXA SCAN

## 2023-11-03 DIAGNOSIS — M9903 Segmental and somatic dysfunction of lumbar region: Secondary | ICD-10-CM | POA: Diagnosis not present

## 2023-11-03 DIAGNOSIS — M9905 Segmental and somatic dysfunction of pelvic region: Secondary | ICD-10-CM | POA: Diagnosis not present

## 2023-11-03 DIAGNOSIS — M9906 Segmental and somatic dysfunction of lower extremity: Secondary | ICD-10-CM | POA: Diagnosis not present

## 2023-11-03 DIAGNOSIS — M62431 Contracture of muscle, right forearm: Secondary | ICD-10-CM | POA: Diagnosis not present

## 2023-11-06 ENCOUNTER — Other Ambulatory Visit (HOSPITAL_COMMUNITY): Payer: Self-pay

## 2023-11-06 ENCOUNTER — Other Ambulatory Visit

## 2023-11-06 DIAGNOSIS — M810 Age-related osteoporosis without current pathological fracture: Secondary | ICD-10-CM | POA: Diagnosis not present

## 2023-11-07 LAB — BASIC METABOLIC PANEL
BUN/Creatinine Ratio: 26 (ref 12–28)
BUN: 20 mg/dL (ref 8–27)
CO2: 25 mmol/L (ref 20–29)
Calcium: 8.9 mg/dL (ref 8.7–10.3)
Chloride: 105 mmol/L (ref 96–106)
Creatinine, Ser: 0.76 mg/dL (ref 0.57–1.00)
Glucose: 75 mg/dL (ref 70–99)
Potassium: 4.3 mmol/L (ref 3.5–5.2)
Sodium: 142 mmol/L (ref 134–144)
eGFR: 82 mL/min/{1.73_m2} (ref >59)

## 2023-11-07 LAB — VITAMIN D 25 HYDROXY (VIT D DEFICIENCY, FRACTURES): Vit D, 25-Hydroxy: 45.6 ng/mL (ref 30.0–100.0)

## 2023-11-13 ENCOUNTER — Ambulatory Visit: Payer: PPO | Admitting: Family Medicine

## 2023-11-13 DIAGNOSIS — M810 Age-related osteoporosis without current pathological fracture: Secondary | ICD-10-CM | POA: Diagnosis not present

## 2023-11-13 MED ORDER — DENOSUMAB 60 MG/ML ~~LOC~~ SOSY
60.0000 mg | PREFILLED_SYRINGE | Freq: Once | SUBCUTANEOUS | Status: AC
  Administered 2023-11-13: 60 mg via SUBCUTANEOUS

## 2023-11-13 NOTE — Progress Notes (Signed)
 Patient given [place] prolia injection 60mg /ml in the left arm. Patient tolerated injection well without reaction at the injection site. Patient will schedule next injection, which is 6 months from today.

## 2023-11-17 DIAGNOSIS — M62431 Contracture of muscle, right forearm: Secondary | ICD-10-CM | POA: Diagnosis not present

## 2023-11-17 DIAGNOSIS — M9905 Segmental and somatic dysfunction of pelvic region: Secondary | ICD-10-CM | POA: Diagnosis not present

## 2023-11-17 DIAGNOSIS — M9903 Segmental and somatic dysfunction of lumbar region: Secondary | ICD-10-CM | POA: Diagnosis not present

## 2023-11-17 DIAGNOSIS — M9906 Segmental and somatic dysfunction of lower extremity: Secondary | ICD-10-CM | POA: Diagnosis not present

## 2023-11-17 NOTE — Telephone Encounter (Signed)
 Last Prolia inj 11/13/23 Next Prolia inj 05/16/24

## 2023-12-11 DIAGNOSIS — M9903 Segmental and somatic dysfunction of lumbar region: Secondary | ICD-10-CM | POA: Diagnosis not present

## 2023-12-11 DIAGNOSIS — M9906 Segmental and somatic dysfunction of lower extremity: Secondary | ICD-10-CM | POA: Diagnosis not present

## 2023-12-11 DIAGNOSIS — M62431 Contracture of muscle, right forearm: Secondary | ICD-10-CM | POA: Diagnosis not present

## 2023-12-11 DIAGNOSIS — M9905 Segmental and somatic dysfunction of pelvic region: Secondary | ICD-10-CM | POA: Diagnosis not present

## 2023-12-20 ENCOUNTER — Encounter: Payer: Self-pay | Admitting: Internal Medicine

## 2023-12-21 ENCOUNTER — Other Ambulatory Visit: Payer: Self-pay

## 2023-12-21 DIAGNOSIS — E785 Hyperlipidemia, unspecified: Secondary | ICD-10-CM

## 2023-12-21 MED ORDER — ATORVASTATIN CALCIUM 40 MG PO TABS
40.0000 mg | ORAL_TABLET | ORAL | 0 refills | Status: DC
Start: 2023-12-21 — End: 2024-06-25

## 2023-12-21 NOTE — Telephone Encounter (Signed)
 previous message pt declines the blood test

## 2023-12-27 ENCOUNTER — Other Ambulatory Visit: Payer: Self-pay | Admitting: Family Medicine

## 2023-12-27 DIAGNOSIS — E785 Hyperlipidemia, unspecified: Secondary | ICD-10-CM

## 2024-01-03 DIAGNOSIS — M170 Bilateral primary osteoarthritis of knee: Secondary | ICD-10-CM | POA: Diagnosis not present

## 2024-01-11 DIAGNOSIS — M1711 Unilateral primary osteoarthritis, right knee: Secondary | ICD-10-CM | POA: Diagnosis not present

## 2024-01-15 ENCOUNTER — Other Ambulatory Visit: Payer: Self-pay | Admitting: Medical

## 2024-01-15 DIAGNOSIS — T7840XD Allergy, unspecified, subsequent encounter: Secondary | ICD-10-CM

## 2024-01-16 NOTE — Telephone Encounter (Signed)
 Not a patient here.

## 2024-01-18 DIAGNOSIS — M1711 Unilateral primary osteoarthritis, right knee: Secondary | ICD-10-CM | POA: Diagnosis not present

## 2024-01-25 DIAGNOSIS — M1711 Unilateral primary osteoarthritis, right knee: Secondary | ICD-10-CM | POA: Diagnosis not present

## 2024-02-27 DIAGNOSIS — M62431 Contracture of muscle, right forearm: Secondary | ICD-10-CM | POA: Diagnosis not present

## 2024-02-27 DIAGNOSIS — M9903 Segmental and somatic dysfunction of lumbar region: Secondary | ICD-10-CM | POA: Diagnosis not present

## 2024-02-27 DIAGNOSIS — M9906 Segmental and somatic dysfunction of lower extremity: Secondary | ICD-10-CM | POA: Diagnosis not present

## 2024-02-27 DIAGNOSIS — M9905 Segmental and somatic dysfunction of pelvic region: Secondary | ICD-10-CM | POA: Diagnosis not present

## 2024-04-24 ENCOUNTER — Other Ambulatory Visit: Payer: Self-pay | Admitting: *Deleted

## 2024-04-24 DIAGNOSIS — R339 Retention of urine, unspecified: Secondary | ICD-10-CM | POA: Diagnosis not present

## 2024-04-24 DIAGNOSIS — M810 Age-related osteoporosis without current pathological fracture: Secondary | ICD-10-CM

## 2024-04-24 DIAGNOSIS — R350 Frequency of micturition: Secondary | ICD-10-CM | POA: Diagnosis not present

## 2024-05-02 ENCOUNTER — Other Ambulatory Visit: Payer: Self-pay

## 2024-05-02 ENCOUNTER — Other Ambulatory Visit (HOSPITAL_COMMUNITY): Payer: Self-pay

## 2024-05-02 ENCOUNTER — Other Ambulatory Visit: Payer: Self-pay | Admitting: Pharmacy Technician

## 2024-05-02 ENCOUNTER — Other Ambulatory Visit: Payer: Self-pay | Admitting: *Deleted

## 2024-05-02 MED ORDER — DENOSUMAB 60 MG/ML ~~LOC~~ SOSY
60.0000 mg | PREFILLED_SYRINGE | Freq: Once | SUBCUTANEOUS | 0 refills | Status: DC
Start: 2024-05-02 — End: 2024-05-07
  Filled 2024-05-02: qty 1, 1d supply, fill #0

## 2024-05-02 NOTE — Telephone Encounter (Signed)
 Medical Buy and Zell  Patient is ready for scheduling on or after: 05/16/24   Out-of-pocket cost due at time of visit: $375.84  Primary: [employer] Prolia  co-insurance: 20% (approximately $350.84) Admin fee co-insurance: 20% (approximately $25)  Deductible: n/a  Prior Auth: NOT required  OOP max: $3400  (met) $484.51    ** This summary of benefits is an estimation of the patient's out-of-pocket cost. Exact cost may vary based on individual plan coverage.     [employer] will not disclose benefits to a 3rd party . I called [employer] directly for benefits.

## 2024-05-03 ENCOUNTER — Telehealth: Payer: Self-pay

## 2024-05-03 ENCOUNTER — Other Ambulatory Visit: Payer: Self-pay

## 2024-05-03 NOTE — Progress Notes (Signed)
 Erika Johns preferred. Sending message to clinic

## 2024-05-03 NOTE — Telephone Encounter (Signed)
 Received prescription for Prolia . Insurance prefers product Jubbonti- no prior authorization needed. Copay is $250. New prescription needed.

## 2024-05-06 ENCOUNTER — Other Ambulatory Visit: Payer: Self-pay

## 2024-05-06 ENCOUNTER — Other Ambulatory Visit (HOSPITAL_COMMUNITY): Payer: Self-pay

## 2024-05-06 ENCOUNTER — Other Ambulatory Visit: Payer: Self-pay | Admitting: *Deleted

## 2024-05-06 MED ORDER — JUBBONTI 60 MG/ML ~~LOC~~ SOSY
60.0000 mg | PREFILLED_SYRINGE | SUBCUTANEOUS | 0 refills | Status: AC
  Filled 2024-05-06: qty 1, fill #0
  Filled 2024-05-07: qty 1, 180d supply, fill #0

## 2024-05-06 NOTE — Progress Notes (Unsigned)
 Jubbonti  rx has been received and on file

## 2024-05-07 ENCOUNTER — Other Ambulatory Visit: Payer: Self-pay

## 2024-05-07 NOTE — Progress Notes (Signed)
 Pharmacy Patient Advocate Encounter  Insurance verification completed.   The patient is insured through [facility] ADVANTAGE/RX ADVANCE   Ran test claim for Jubbonti . Co-pay is $250.  This test claim was processed through [facility]- copay amounts may vary at other pharmacies due to pharmacy/plan contracts, or as the patient moves through the different stages of their insurance plan.

## 2024-05-07 NOTE — Progress Notes (Signed)
 Specialty Pharmacy Initial Fill Coordination Note  Erika Johns is a 73 y.o. female contacted today regarding initial fill of specialty medication(s) Denosumab -bbdz (Jubbonti )   Patient requested Courier to Provider Office   Delivery date: 05/20/24   Verified address: [facility] [employer].   Medication will be filled on 9/26.   Patient is aware of $250 copayment.

## 2024-05-08 DIAGNOSIS — M9905 Segmental and somatic dysfunction of pelvic region: Secondary | ICD-10-CM | POA: Diagnosis not present

## 2024-05-08 DIAGNOSIS — M9906 Segmental and somatic dysfunction of lower extremity: Secondary | ICD-10-CM | POA: Diagnosis not present

## 2024-05-08 DIAGNOSIS — M9903 Segmental and somatic dysfunction of lumbar region: Secondary | ICD-10-CM | POA: Diagnosis not present

## 2024-05-08 DIAGNOSIS — M62431 Contracture of muscle, right forearm: Secondary | ICD-10-CM | POA: Diagnosis not present

## 2024-05-13 ENCOUNTER — Other Ambulatory Visit: Payer: Self-pay

## 2024-05-13 DIAGNOSIS — M9905 Segmental and somatic dysfunction of pelvic region: Secondary | ICD-10-CM | POA: Diagnosis not present

## 2024-05-13 DIAGNOSIS — M9903 Segmental and somatic dysfunction of lumbar region: Secondary | ICD-10-CM | POA: Diagnosis not present

## 2024-05-13 DIAGNOSIS — M62431 Contracture of muscle, right forearm: Secondary | ICD-10-CM | POA: Diagnosis not present

## 2024-05-13 DIAGNOSIS — M9906 Segmental and somatic dysfunction of lower extremity: Secondary | ICD-10-CM | POA: Diagnosis not present

## 2024-05-15 DIAGNOSIS — M810 Age-related osteoporosis without current pathological fracture: Secondary | ICD-10-CM | POA: Diagnosis not present

## 2024-05-15 LAB — BASIC METABOLIC PANEL WITH GFR
BUN/Creatinine Ratio: 20 (ref 12–28)
BUN: 16 mg/dL (ref 8–27)
CO2: 28 mmol/L (ref 20–29)
Calcium: 9.2 mg/dL (ref 8.7–10.3)
Chloride: 105 mmol/L (ref 96–106)
Creatinine, Ser: 0.81 mg/dL (ref 0.57–1.00)
Glucose: 64 mg/dL — ABNORMAL LOW (ref 70–99)
Potassium: 4.1 mmol/L (ref 3.5–5.2)
Sodium: 143 mmol/L (ref 134–144)
eGFR: 76 mL/min/1.73 (ref >59)

## 2024-05-16 ENCOUNTER — Ambulatory Visit: Payer: Self-pay | Admitting: Family Medicine

## 2024-05-16 ENCOUNTER — Ambulatory Visit: Admitting: Family Medicine

## 2024-05-16 LAB — VITAMIN D 25 HYDROXY (VIT D DEFICIENCY, FRACTURES): Vit D, 25-Hydroxy: 48.3 ng/mL (ref 30.0–100.0)

## 2024-05-17 ENCOUNTER — Other Ambulatory Visit: Payer: Self-pay

## 2024-05-28 ENCOUNTER — Ambulatory Visit: Admitting: Family Medicine

## 2024-05-29 ENCOUNTER — Ambulatory Visit (INDEPENDENT_AMBULATORY_CARE_PROVIDER_SITE_OTHER): Admitting: Family Medicine

## 2024-05-29 VITALS — Ht 70.25 in

## 2024-05-29 DIAGNOSIS — M810 Age-related osteoporosis without current pathological fracture: Secondary | ICD-10-CM

## 2024-05-29 MED ORDER — DENOSUMAB-BBDZ 60 MG/ML ~~LOC~~ SOSY
60.0000 mg | PREFILLED_SYRINGE | SUBCUTANEOUS | Status: AC
  Administered 2024-05-29: 60 mg via SUBCUTANEOUS

## 2024-05-29 NOTE — Progress Notes (Signed)
 Patient given [place] jubbonti  injection 60mg /ml in the left arm. Patient tolerated injection well without reaction at the injection site. Pt provided the medication from the [facility] pharmacy.  Patient will schedule next injection, which is 6 months from today.

## 2024-05-31 DIAGNOSIS — Z1231 Encounter for screening mammogram for malignant neoplasm of breast: Secondary | ICD-10-CM | POA: Diagnosis not present

## 2024-05-31 LAB — HM MAMMOGRAPHY

## 2024-06-03 ENCOUNTER — Encounter: Payer: Self-pay | Admitting: Family Medicine

## 2024-06-03 ENCOUNTER — Telehealth: Payer: Self-pay | Admitting: Internal Medicine

## 2024-06-03 ENCOUNTER — Ambulatory Visit: Payer: Self-pay | Admitting: Family Medicine

## 2024-06-03 NOTE — Telephone Encounter (Signed)
 Next Cpe is scheduled for 10/28/24 A lab was placed on 05/29/24 for her glucose levels.  Pt states she has concerns over glucose levels and is requesting labs earlier than the next scheduled CPE.  PT is requesting to have them done two weeks before 06/18/24 which is her next scheduled appointment.

## 2024-06-04 NOTE — Telephone Encounter (Signed)
 If labs needed can be done day of visit. I do not see any concerning glucose levels in her recent labs.

## 2024-06-04 NOTE — Telephone Encounter (Signed)
**Note De-identified  [place] Obfuscation** Please advise 

## 2024-06-17 DIAGNOSIS — M9903 Segmental and somatic dysfunction of lumbar region: Secondary | ICD-10-CM | POA: Diagnosis not present

## 2024-06-17 DIAGNOSIS — M9905 Segmental and somatic dysfunction of pelvic region: Secondary | ICD-10-CM | POA: Diagnosis not present

## 2024-06-17 DIAGNOSIS — M9906 Segmental and somatic dysfunction of lower extremity: Secondary | ICD-10-CM | POA: Diagnosis not present

## 2024-06-17 DIAGNOSIS — M62431 Contracture of muscle, right forearm: Secondary | ICD-10-CM | POA: Diagnosis not present

## 2024-06-18 ENCOUNTER — Ambulatory Visit (INDEPENDENT_AMBULATORY_CARE_PROVIDER_SITE_OTHER): Admitting: Internal Medicine

## 2024-06-18 ENCOUNTER — Encounter: Payer: Self-pay | Admitting: Internal Medicine

## 2024-06-18 VITALS — BP 118/70 | HR 64 | Temp 97.8°F | Ht 70.25 in | Wt 147.0 lb

## 2024-06-18 DIAGNOSIS — E782 Mixed hyperlipidemia: Secondary | ICD-10-CM | POA: Diagnosis not present

## 2024-06-18 DIAGNOSIS — F418 Other specified anxiety disorders: Secondary | ICD-10-CM | POA: Diagnosis not present

## 2024-06-18 DIAGNOSIS — F5101 Primary insomnia: Secondary | ICD-10-CM

## 2024-06-18 DIAGNOSIS — Z0000 Encounter for general adult medical examination without abnormal findings: Secondary | ICD-10-CM | POA: Diagnosis not present

## 2024-06-18 DIAGNOSIS — M810 Age-related osteoporosis without current pathological fracture: Secondary | ICD-10-CM

## 2024-06-18 DIAGNOSIS — K5904 Chronic idiopathic constipation: Secondary | ICD-10-CM | POA: Diagnosis not present

## 2024-06-18 MED ORDER — HYDROCORTISONE (PERIANAL) 2.5 % EX CREA: 1.0000 | TOPICAL_CREAM | Freq: Two times a day (BID) | CUTANEOUS | 0 refills | Status: AC

## 2024-06-18 NOTE — Progress Notes (Unsigned)
   Subjective:   Patient ID: Erika Johns, female    DOB: March 13, 1951, 73 y.o.   MRN: [id number]  The patient is here for physical. Pertinent topics discussed: Discussed the use of AI scribe software for clinical note transcription with the patient, who gave verbal consent to proceed.  History of Present Illness Erika Johns is a 73 year old female who presents for a routine follow-up visit.  She experiences ongoing stress related to caregiving for her 18 year old sister in [place] , who has diabetes and is overweight. Her sister was recently put on probation in her care facility, adding to her stress. She receives late-night calls from her sister with requests, which contributes to her stress levels.  She has been taking Zoloft  12.5 mg daily (half of her prescribed 25 mg dose) and is uncertain about how to stop the medication. She has a history of depression and is a widow, which may contribute to her mental health status.  She is active, participating in cycling events and yoga. She has been on seven rides this year, although her mileage is less than last year due to an illness in February. She practices yoga regularly, attending classes three times a week. She also takes turmeric and fish oil daily.  She has sleep disturbances, often waking up at 3 AM. She uses Klonopin  and Belsomra , both at half doses, to help with sleep. She starts with 10 mg of melatonin but resorts to medication if she cannot fall back asleep. She avoids screens but engages in light activities like washing dishes when awake at night.  PMH, [facility], social history reviewed and updated  Review of Systems  Constitutional: Negative.   HENT: Negative.    Eyes: Negative.   Respiratory:  Negative for cough, chest tightness and shortness of breath.   Cardiovascular:  Negative for chest pain, palpitations and leg swelling.  Gastrointestinal:  Negative for abdominal distention, abdominal pain,  constipation, diarrhea, nausea and vomiting.  Musculoskeletal:  Positive for arthralgias.  Skin: Negative.   Neurological: Negative.   Psychiatric/Behavioral: Negative.      Objective:  Physical Exam Constitutional:      Appearance: She is well-developed.  HENT:     Head: Normocephalic and atraumatic.  Cardiovascular:     Rate and Rhythm: Normal rate and regular rhythm.  Pulmonary:     Effort: Pulmonary effort is normal. No respiratory distress.     Breath sounds: Normal breath sounds. No wheezing or rales.  Abdominal:     General: Bowel sounds are normal. There is no distension.     Palpations: Abdomen is soft.     Tenderness: There is no abdominal tenderness.  Musculoskeletal:     Cervical back: Normal range of motion.  Skin:    General: Skin is warm and dry.  Neurological:     Mental Status: She is alert and oriented to person, place, and time.     Coordination: Coordination normal.     Vitals:   06/18/24 0834  BP: 118/70  Pulse: 64  Temp: 97.8 F (36.6 C)  TempSrc: Oral  SpO2: 97%  Weight: 147 lb (66.7 kg)  Height: 5' 10.25 (1.784 m)    Assessment & Plan:

## 2024-06-19 LAB — COMPREHENSIVE METABOLIC PANEL WITH GFR
ALT: 13 U/L (ref 0–35)
AST: 16 U/L (ref 0–37)
Albumin: 4.1 g/dL (ref 3.5–5.2)
Alkaline Phosphatase: 39 U/L (ref 39–117)
BUN: 18 mg/dL (ref 6–23)
CO2: 28 meq/L (ref 19–32)
Calcium: 8.7 mg/dL (ref 8.4–10.5)
Chloride: 106 meq/L (ref 96–112)
Creatinine, Ser: 0.8 mg/dL (ref 0.40–1.20)
GFR: 72.42 mL/min (ref >60.00)
Glucose, Bld: 92 mg/dL (ref 70–99)
Potassium: 3.8 meq/L (ref 3.5–5.1)
Sodium: 140 meq/L (ref 135–145)
Total Bilirubin: 0.6 mg/dL (ref 0.2–1.2)
Total Protein: 6.5 g/dL (ref 6.0–8.3)

## 2024-06-19 LAB — LIPID PANEL
Cholesterol: 183 mg/dL (ref 0–200)
HDL: 71.7 mg/dL (ref >39.00)
LDL Cholesterol: 98 mg/dL (ref 0–99)
NonHDL: 111.15
Total CHOL/HDL Ratio: 3
Triglycerides: 68 mg/dL (ref 0.0–149.0)
VLDL: 13.6 mg/dL (ref 0.0–40.0)

## 2024-06-19 LAB — CBC
HCT: 41.4 % (ref 36.0–46.0)
Hemoglobin: 13.8 g/dL (ref 12.0–15.0)
MCHC: 33.2 g/dL (ref 30.0–36.0)
MCV: 91.8 fl (ref 78.0–100.0)
Platelets: 150 K/uL (ref 150.0–400.0)
RBC: 4.51 Mil/uL (ref 3.87–5.11)
RDW: 12.9 % (ref 11.5–15.5)
WBC: 3.8 K/uL — ABNORMAL LOW (ref 4.0–10.5)

## 2024-06-21 ENCOUNTER — Ambulatory Visit: Payer: Self-pay | Admitting: Internal Medicine

## 2024-06-21 DIAGNOSIS — Z0000 Encounter for general adult medical examination without abnormal findings: Secondary | ICD-10-CM | POA: Insufficient documentation

## 2024-06-21 NOTE — Assessment & Plan Note (Signed)
 Uses otc with success continue.

## 2024-06-21 NOTE — Assessment & Plan Note (Signed)
 Flu shot up to date. Pneumonia complete. Shingrix complete. Tetanus up to date. Colonoscopy up to date. Mammogram up to date, pap smear aged out and dexa up to date. Counseled about sun safety and mole surveillance. Counseled about the dangers of distracted driving. Given 10 year screening recommendations.

## 2024-06-21 NOTE — Assessment & Plan Note (Signed)
Checking lipid panel and adjust lipitor as needed.  

## 2024-06-21 NOTE — Assessment & Plan Note (Signed)
 Taking prolia  and will continue.

## 2024-06-21 NOTE — Assessment & Plan Note (Signed)
 Taking zoloft  12.5 mg daily and advised if she wants to taper take 12.5 mg every other day for 1-2 weeks then stop or she can just stop without taper when desired.

## 2024-06-21 NOTE — Assessment & Plan Note (Signed)
 Uses clonazepam  or belsomra  rarely and can refill when needed.

## 2024-06-25 ENCOUNTER — Other Ambulatory Visit: Payer: Self-pay | Admitting: Internal Medicine

## 2024-06-25 DIAGNOSIS — E785 Hyperlipidemia, unspecified: Secondary | ICD-10-CM

## 2024-06-28 DIAGNOSIS — M9906 Segmental and somatic dysfunction of lower extremity: Secondary | ICD-10-CM | POA: Diagnosis not present

## 2024-06-28 DIAGNOSIS — M62411 Contracture of muscle, right shoulder: Secondary | ICD-10-CM | POA: Diagnosis not present

## 2024-06-28 DIAGNOSIS — M62431 Contracture of muscle, right forearm: Secondary | ICD-10-CM | POA: Diagnosis not present

## 2024-06-28 DIAGNOSIS — M9903 Segmental and somatic dysfunction of lumbar region: Secondary | ICD-10-CM | POA: Diagnosis not present

## 2024-06-28 DIAGNOSIS — M9905 Segmental and somatic dysfunction of pelvic region: Secondary | ICD-10-CM | POA: Diagnosis not present

## 2024-07-08 DIAGNOSIS — M62431 Contracture of muscle, right forearm: Secondary | ICD-10-CM | POA: Diagnosis not present

## 2024-07-08 DIAGNOSIS — M9906 Segmental and somatic dysfunction of lower extremity: Secondary | ICD-10-CM | POA: Diagnosis not present

## 2024-07-08 DIAGNOSIS — M62411 Contracture of muscle, right shoulder: Secondary | ICD-10-CM | POA: Diagnosis not present

## 2024-07-08 DIAGNOSIS — M9903 Segmental and somatic dysfunction of lumbar region: Secondary | ICD-10-CM | POA: Diagnosis not present

## 2024-07-08 DIAGNOSIS — M9905 Segmental and somatic dysfunction of pelvic region: Secondary | ICD-10-CM | POA: Diagnosis not present

## 2024-07-16 DIAGNOSIS — M170 Bilateral primary osteoarthritis of knee: Secondary | ICD-10-CM | POA: Diagnosis not present

## 2024-07-16 DIAGNOSIS — M546 Pain in thoracic spine: Secondary | ICD-10-CM | POA: Diagnosis not present

## 2024-08-27 ENCOUNTER — Encounter: Payer: Self-pay | Admitting: Internal Medicine

## 2024-08-27 DIAGNOSIS — F418 Other specified anxiety disorders: Secondary | ICD-10-CM

## 2024-08-27 DIAGNOSIS — F439 Reaction to severe stress, unspecified: Secondary | ICD-10-CM

## 2024-08-30 MED ORDER — CLONAZEPAM 0.5 MG PO TABS: 0.2500 mg | ORAL_TABLET | Freq: Every day | ORAL | 0 refills | Status: AC | PRN

## 2024-10-28 ENCOUNTER — Encounter: Admitting: Internal Medicine

## 2024-10-28 ENCOUNTER — Ambulatory Visit

## 2024-11-29 ENCOUNTER — Ambulatory Visit: Admitting: Family Medicine
